# Patient Record
Sex: Female | Born: 1950
Health system: Southern US, Community
[De-identification: ages and names within clinical notes are randomized; demographics above are authoritative.]

## PROBLEM LIST (undated history)

## (undated) DIAGNOSIS — E785 Hyperlipidemia, unspecified: Secondary | ICD-10-CM

## (undated) DIAGNOSIS — Z90710 Acquired absence of both cervix and uterus: Secondary | ICD-10-CM

## (undated) DIAGNOSIS — A4902 Methicillin resistant Staphylococcus aureus infection, unspecified site: Secondary | ICD-10-CM

## (undated) DIAGNOSIS — G709 Myoneural disorder, unspecified: Secondary | ICD-10-CM

## (undated) DIAGNOSIS — J45909 Unspecified asthma, uncomplicated: Secondary | ICD-10-CM

## (undated) DIAGNOSIS — N6019 Diffuse cystic mastopathy of unspecified breast: Secondary | ICD-10-CM

## (undated) DIAGNOSIS — K219 Gastro-esophageal reflux disease without esophagitis: Secondary | ICD-10-CM

## (undated) DIAGNOSIS — E119 Type 2 diabetes mellitus without complications: Secondary | ICD-10-CM

## (undated) DIAGNOSIS — I10 Essential (primary) hypertension: Secondary | ICD-10-CM

## (undated) HISTORY — PX: BREAST SURGERY: SHX581

## (undated) HISTORY — DX: Diffuse cystic mastopathy of unspecified breast: N60.19

## (undated) HISTORY — PX: BREAST EXCISIONAL BIOPSY: SUR124

## (undated) HISTORY — PX: COLONOSCOPY: SHX174

## (undated) HISTORY — DX: Hyperlipidemia, unspecified: E78.5

## (undated) HISTORY — PX: ABDOMINAL HYSTERECTOMY: SHX81

---
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
## 1997-08-14 ENCOUNTER — Encounter (HOSPITAL_COMMUNITY): Admission: RE | Admit: 1997-08-14 | Discharge: 1997-11-12 | Payer: Self-pay | Admitting: Obstetrics and Gynecology

## 1997-09-16 ENCOUNTER — Ambulatory Visit (HOSPITAL_COMMUNITY): Admission: RE | Admit: 1997-09-16 | Discharge: 1997-09-16 | Payer: Self-pay | Admitting: Internal Medicine

## 1998-09-30 ENCOUNTER — Other Ambulatory Visit: Admission: RE | Admit: 1998-09-30 | Discharge: 1998-09-30 | Payer: Self-pay | Admitting: Internal Medicine

## 1999-01-04 ENCOUNTER — Emergency Department (HOSPITAL_COMMUNITY): Admission: EM | Admit: 1999-01-04 | Discharge: 1999-01-04 | Payer: Self-pay

## 1999-01-13 ENCOUNTER — Encounter: Payer: Self-pay | Admitting: Emergency Medicine

## 1999-01-13 ENCOUNTER — Inpatient Hospital Stay (HOSPITAL_COMMUNITY): Admission: EM | Admit: 1999-01-13 | Discharge: 1999-01-14 | Payer: Self-pay | Admitting: Emergency Medicine

## 1999-08-09 ENCOUNTER — Ambulatory Visit (HOSPITAL_COMMUNITY): Admission: RE | Admit: 1999-08-09 | Discharge: 1999-08-09 | Payer: Self-pay | Admitting: Internal Medicine

## 1999-08-09 ENCOUNTER — Encounter: Payer: Self-pay | Admitting: Internal Medicine

## 2000-01-09 ENCOUNTER — Encounter: Payer: Self-pay | Admitting: Emergency Medicine

## 2000-01-09 ENCOUNTER — Emergency Department (HOSPITAL_COMMUNITY): Admission: EM | Admit: 2000-01-09 | Discharge: 2000-01-09 | Payer: Self-pay | Admitting: Emergency Medicine

## 2000-02-08 ENCOUNTER — Emergency Department (HOSPITAL_COMMUNITY): Admission: EM | Admit: 2000-02-08 | Discharge: 2000-02-08 | Payer: Self-pay | Admitting: *Deleted

## 2000-03-15 ENCOUNTER — Ambulatory Visit (HOSPITAL_COMMUNITY): Admission: RE | Admit: 2000-03-15 | Discharge: 2000-03-15 | Payer: Self-pay | Admitting: Internal Medicine

## 2000-03-15 ENCOUNTER — Encounter: Payer: Self-pay | Admitting: Internal Medicine

## 2000-05-04 ENCOUNTER — Encounter: Payer: Self-pay | Admitting: Internal Medicine

## 2000-05-04 ENCOUNTER — Encounter: Admission: RE | Admit: 2000-05-04 | Discharge: 2000-05-04 | Payer: Self-pay | Admitting: Internal Medicine

## 2000-05-18 ENCOUNTER — Other Ambulatory Visit: Admission: RE | Admit: 2000-05-18 | Discharge: 2000-05-18 | Payer: Self-pay | Admitting: Obstetrics and Gynecology

## 2000-06-05 ENCOUNTER — Emergency Department (HOSPITAL_COMMUNITY): Admission: EM | Admit: 2000-06-05 | Discharge: 2000-06-06 | Payer: Self-pay | Admitting: Emergency Medicine

## 2000-10-11 ENCOUNTER — Encounter (INDEPENDENT_AMBULATORY_CARE_PROVIDER_SITE_OTHER): Payer: Self-pay

## 2000-10-11 ENCOUNTER — Ambulatory Visit (HOSPITAL_COMMUNITY): Admission: RE | Admit: 2000-10-11 | Discharge: 2000-10-11 | Payer: Self-pay | Admitting: Obstetrics and Gynecology

## 2001-05-03 ENCOUNTER — Encounter: Payer: Self-pay | Admitting: Internal Medicine

## 2001-05-03 ENCOUNTER — Ambulatory Visit (HOSPITAL_COMMUNITY): Admission: RE | Admit: 2001-05-03 | Discharge: 2001-05-03 | Payer: Self-pay | Admitting: Internal Medicine

## 2001-07-03 ENCOUNTER — Inpatient Hospital Stay (HOSPITAL_COMMUNITY): Admission: EM | Admit: 2001-07-03 | Discharge: 2001-07-04 | Payer: Self-pay

## 2001-07-20 ENCOUNTER — Other Ambulatory Visit: Admission: RE | Admit: 2001-07-20 | Discharge: 2001-07-20 | Payer: Self-pay | Admitting: Obstetrics and Gynecology

## 2001-08-09 ENCOUNTER — Encounter (INDEPENDENT_AMBULATORY_CARE_PROVIDER_SITE_OTHER): Payer: Self-pay

## 2001-08-09 ENCOUNTER — Inpatient Hospital Stay (HOSPITAL_COMMUNITY): Admission: RE | Admit: 2001-08-09 | Discharge: 2001-08-12 | Payer: Self-pay | Admitting: Obstetrics and Gynecology

## 2002-06-06 ENCOUNTER — Encounter: Payer: Self-pay | Admitting: Internal Medicine

## 2002-06-06 ENCOUNTER — Ambulatory Visit (HOSPITAL_COMMUNITY): Admission: RE | Admit: 2002-06-06 | Discharge: 2002-06-06 | Payer: Self-pay | Admitting: Internal Medicine

## 2002-08-13 ENCOUNTER — Encounter: Payer: Self-pay | Admitting: Obstetrics and Gynecology

## 2002-08-13 ENCOUNTER — Ambulatory Visit (HOSPITAL_COMMUNITY): Admission: RE | Admit: 2002-08-13 | Discharge: 2002-08-13 | Payer: Self-pay | Admitting: Obstetrics and Gynecology

## 2002-09-20 ENCOUNTER — Encounter: Payer: Self-pay | Admitting: Emergency Medicine

## 2002-09-20 ENCOUNTER — Emergency Department (HOSPITAL_COMMUNITY): Admission: EM | Admit: 2002-09-20 | Discharge: 2002-09-21 | Payer: Self-pay | Admitting: Emergency Medicine

## 2003-04-09 ENCOUNTER — Ambulatory Visit (HOSPITAL_COMMUNITY): Admission: RE | Admit: 2003-04-09 | Discharge: 2003-04-09 | Payer: Self-pay | Admitting: Gastroenterology

## 2003-05-08 ENCOUNTER — Encounter: Admission: RE | Admit: 2003-05-08 | Discharge: 2003-05-08 | Payer: Self-pay | Admitting: Gastroenterology

## 2003-06-09 ENCOUNTER — Ambulatory Visit (HOSPITAL_COMMUNITY): Admission: RE | Admit: 2003-06-09 | Discharge: 2003-06-09 | Payer: Self-pay | Admitting: Internal Medicine

## 2004-05-19 ENCOUNTER — Encounter: Admission: RE | Admit: 2004-05-19 | Discharge: 2004-05-19 | Payer: Self-pay | Admitting: Internal Medicine

## 2004-05-25 ENCOUNTER — Emergency Department (HOSPITAL_COMMUNITY): Admission: EM | Admit: 2004-05-25 | Discharge: 2004-05-25 | Payer: Self-pay | Admitting: Emergency Medicine

## 2004-06-09 ENCOUNTER — Ambulatory Visit (HOSPITAL_COMMUNITY): Admission: RE | Admit: 2004-06-09 | Discharge: 2004-06-09 | Payer: Self-pay | Admitting: Internal Medicine

## 2004-10-13 LAB — HM COLONOSCOPY

## 2005-05-11 ENCOUNTER — Encounter: Admission: RE | Admit: 2005-05-11 | Discharge: 2005-05-11 | Payer: Self-pay | Admitting: Internal Medicine

## 2005-07-17 ENCOUNTER — Emergency Department (HOSPITAL_COMMUNITY): Admission: EM | Admit: 2005-07-17 | Discharge: 2005-07-17 | Payer: Self-pay | Admitting: Family Medicine

## 2005-07-18 ENCOUNTER — Encounter: Admission: RE | Admit: 2005-07-18 | Discharge: 2005-07-18 | Payer: Self-pay | Admitting: Internal Medicine

## 2005-10-11 ENCOUNTER — Ambulatory Visit (HOSPITAL_COMMUNITY): Admission: RE | Admit: 2005-10-11 | Discharge: 2005-10-11 | Payer: Self-pay | Admitting: Internal Medicine

## 2005-10-28 ENCOUNTER — Ambulatory Visit (HOSPITAL_BASED_OUTPATIENT_CLINIC_OR_DEPARTMENT_OTHER): Admission: RE | Admit: 2005-10-28 | Discharge: 2005-10-28 | Payer: Self-pay | Admitting: Orthopedic Surgery

## 2006-02-01 ENCOUNTER — Encounter: Admission: RE | Admit: 2006-02-01 | Discharge: 2006-02-01 | Payer: Self-pay | Admitting: Internal Medicine

## 2006-04-18 DIAGNOSIS — A4902 Methicillin resistant Staphylococcus aureus infection, unspecified site: Secondary | ICD-10-CM

## 2006-04-18 HISTORY — DX: Methicillin resistant Staphylococcus aureus infection, unspecified site: A49.02

## 2006-07-31 ENCOUNTER — Encounter: Admission: RE | Admit: 2006-07-31 | Discharge: 2006-07-31 | Payer: Self-pay | Admitting: Internal Medicine

## 2006-10-17 ENCOUNTER — Ambulatory Visit (HOSPITAL_COMMUNITY): Admission: RE | Admit: 2006-10-17 | Discharge: 2006-10-17 | Payer: Self-pay | Admitting: Internal Medicine

## 2007-05-19 ENCOUNTER — Emergency Department (HOSPITAL_COMMUNITY): Admission: EM | Admit: 2007-05-19 | Discharge: 2007-05-19 | Payer: Self-pay | Admitting: Emergency Medicine

## 2007-05-31 ENCOUNTER — Ambulatory Visit: Payer: Self-pay

## 2007-11-21 ENCOUNTER — Encounter: Admission: RE | Admit: 2007-11-21 | Discharge: 2007-11-21 | Payer: Self-pay | Admitting: Internal Medicine

## 2008-02-04 ENCOUNTER — Ambulatory Visit: Payer: Self-pay | Admitting: Internal Medicine

## 2008-03-21 ENCOUNTER — Ambulatory Visit: Payer: Self-pay | Admitting: Internal Medicine

## 2008-04-24 ENCOUNTER — Ambulatory Visit: Payer: Self-pay | Admitting: Internal Medicine

## 2008-08-11 ENCOUNTER — Ambulatory Visit: Payer: Self-pay | Admitting: Internal Medicine

## 2008-08-13 ENCOUNTER — Encounter: Admission: RE | Admit: 2008-08-13 | Discharge: 2008-08-13 | Payer: Self-pay | Admitting: Internal Medicine

## 2008-08-25 ENCOUNTER — Ambulatory Visit: Payer: Self-pay | Admitting: Internal Medicine

## 2008-09-02 ENCOUNTER — Other Ambulatory Visit: Admission: RE | Admit: 2008-09-02 | Discharge: 2008-09-02 | Payer: Self-pay | Admitting: Internal Medicine

## 2008-09-02 ENCOUNTER — Ambulatory Visit: Payer: Self-pay | Admitting: Internal Medicine

## 2008-09-18 ENCOUNTER — Ambulatory Visit: Payer: Self-pay | Admitting: Internal Medicine

## 2009-02-23 ENCOUNTER — Ambulatory Visit: Payer: Self-pay | Admitting: Internal Medicine

## 2009-03-10 ENCOUNTER — Ambulatory Visit: Payer: Self-pay | Admitting: Internal Medicine

## 2009-03-24 ENCOUNTER — Ambulatory Visit: Payer: Self-pay | Admitting: Internal Medicine

## 2009-05-11 ENCOUNTER — Encounter: Admission: RE | Admit: 2009-05-11 | Discharge: 2009-05-11 | Payer: Self-pay | Admitting: Internal Medicine

## 2009-09-28 ENCOUNTER — Ambulatory Visit: Payer: Self-pay | Admitting: Internal Medicine

## 2009-10-12 ENCOUNTER — Ambulatory Visit: Payer: Self-pay | Admitting: Internal Medicine

## 2009-10-29 ENCOUNTER — Ambulatory Visit: Payer: Self-pay | Admitting: Internal Medicine

## 2009-11-09 ENCOUNTER — Ambulatory Visit: Payer: Self-pay | Admitting: Internal Medicine

## 2010-01-26 ENCOUNTER — Ambulatory Visit: Payer: Self-pay | Admitting: Internal Medicine

## 2010-03-12 ENCOUNTER — Ambulatory Visit: Payer: Self-pay | Admitting: Vascular Surgery

## 2010-03-12 ENCOUNTER — Encounter (INDEPENDENT_AMBULATORY_CARE_PROVIDER_SITE_OTHER): Payer: Self-pay | Admitting: Emergency Medicine

## 2010-03-12 ENCOUNTER — Emergency Department (HOSPITAL_COMMUNITY): Admission: EM | Admit: 2010-03-12 | Discharge: 2010-03-12 | Payer: Self-pay | Admitting: Emergency Medicine

## 2010-03-29 ENCOUNTER — Ambulatory Visit: Payer: Self-pay | Admitting: Internal Medicine

## 2010-06-01 ENCOUNTER — Encounter (HOSPITAL_COMMUNITY): Payer: Self-pay

## 2010-06-01 ENCOUNTER — Emergency Department (HOSPITAL_COMMUNITY): Payer: BC Managed Care – PPO

## 2010-06-01 ENCOUNTER — Emergency Department (HOSPITAL_COMMUNITY)
Admission: EM | Admit: 2010-06-01 | Discharge: 2010-06-01 | Disposition: A | Discharge status: Home / Self Care | Payer: BC Managed Care – PPO | Attending: Emergency Medicine | Admitting: Emergency Medicine

## 2010-06-01 DIAGNOSIS — R112 Nausea with vomiting, unspecified: Secondary | ICD-10-CM | POA: Insufficient documentation

## 2010-06-01 DIAGNOSIS — R1012 Left upper quadrant pain: Secondary | ICD-10-CM | POA: Insufficient documentation

## 2010-06-01 DIAGNOSIS — R1013 Epigastric pain: Secondary | ICD-10-CM | POA: Insufficient documentation

## 2010-06-01 DIAGNOSIS — K219 Gastro-esophageal reflux disease without esophagitis: Secondary | ICD-10-CM | POA: Insufficient documentation

## 2010-06-01 DIAGNOSIS — I10 Essential (primary) hypertension: Secondary | ICD-10-CM | POA: Insufficient documentation

## 2010-06-01 DIAGNOSIS — E785 Hyperlipidemia, unspecified: Secondary | ICD-10-CM | POA: Insufficient documentation

## 2010-06-01 HISTORY — DX: Gastro-esophageal reflux disease without esophagitis: K21.9

## 2010-06-01 HISTORY — DX: Essential (primary) hypertension: I10

## 2010-06-01 HISTORY — DX: Acquired absence of both cervix and uterus: Z90.710

## 2010-06-01 LAB — URINE MICROSCOPIC-ADD ON

## 2010-06-01 LAB — COMPREHENSIVE METABOLIC PANEL
ALT: 21 U/L (ref 0–35)
AST: 21 U/L (ref 0–37)
Albumin: 4.3 g/dL (ref 3.5–5.2)
Alkaline Phosphatase: 53 U/L (ref 39–117)
BUN: 14 mg/dL (ref 6–23)
CO2: 30 mEq/L (ref 19–32)
Calcium: 9.3 mg/dL (ref 8.4–10.5)
Chloride: 101 mEq/L (ref 96–112)
Creatinine, Ser: 0.9 mg/dL (ref 0.4–1.2)
GFR calc Af Amer: >60 mL/min (ref >60)
GFR calc non Af Amer: >60 mL/min (ref >60)
Glucose, Bld: 140 mg/dL — ABNORMAL HIGH (ref 70–99)
Potassium: 3.5 mEq/L (ref 3.5–5.1)
Sodium: 141 mEq/L (ref 135–145)
Total Bilirubin: 0.7 mg/dL (ref 0.3–1.2)
Total Protein: 7.3 g/dL (ref 6.0–8.3)

## 2010-06-01 LAB — DIFFERENTIAL
Basophils Absolute: 0 10*3/uL (ref 0.0–0.1)
Basophils Relative: 0 % (ref 0–1)
Eosinophils Absolute: 0 10*3/uL (ref 0.0–0.7)
Eosinophils Relative: 0 % (ref 0–5)
Lymphocytes Relative: 11 % — ABNORMAL LOW (ref 12–46)
Lymphs Abs: 0.7 10*3/uL (ref 0.7–4.0)
Monocytes Absolute: 0.3 10*3/uL (ref 0.1–1.0)
Monocytes Relative: 5 % (ref 3–12)
Neutro Abs: 5.6 10*3/uL (ref 1.7–7.7)
Neutrophils Relative %: 85 % — ABNORMAL HIGH (ref 43–77)

## 2010-06-01 LAB — URINALYSIS, ROUTINE W REFLEX MICROSCOPIC
Bilirubin Urine: NEGATIVE
Ketones, ur: NEGATIVE mg/dL
Leukocytes, UA: NEGATIVE
Nitrite: NEGATIVE
Protein, ur: NEGATIVE mg/dL
Specific Gravity, Urine: 1.02 (ref 1.005–1.030)
Urine Glucose, Fasting: NEGATIVE mg/dL
Urobilinogen, UA: 0.2 mg/dL (ref 0.0–1.0)
pH: 7.5 (ref 5.0–8.0)

## 2010-06-01 LAB — LIPASE, BLOOD: Lipase: 16 U/L (ref 11–59)

## 2010-06-01 LAB — CBC
HCT: 37.5 % (ref 36.0–46.0)
Hemoglobin: 12.9 g/dL (ref 12.0–15.0)
MCH: 30.3 pg (ref 26.0–34.0)
MCHC: 34.4 g/dL (ref 30.0–36.0)
MCV: 88 fL (ref 78.0–100.0)
Platelets: 229 10*3/uL (ref 150–400)
RBC: 4.26 MIL/uL (ref 3.87–5.11)
RDW: 13.1 % (ref 11.5–15.5)
WBC: 6.7 10*3/uL (ref 4.0–10.5)

## 2010-06-01 MED ORDER — IOHEXOL 300 MG/ML  SOLN: 100.0000 mL | Freq: Once | INTRAMUSCULAR | Status: DC | PRN

## 2010-06-01 MED ORDER — IOHEXOL 300 MG/ML  SOLN
100.0000 mL | Freq: Once | INTRAMUSCULAR | Status: AC | PRN
  Administered 2010-06-01: 100 mL via INTRAVENOUS

## 2010-06-02 DIAGNOSIS — K529 Noninfective gastroenteritis and colitis, unspecified: Secondary | ICD-10-CM

## 2010-06-02 LAB — URINE CULTURE
Colony Count: 75000
Culture  Setup Time: 201202141704

## 2010-06-29 LAB — POCT I-STAT, CHEM 8
BUN: 16 mg/dL (ref 6–23)
Calcium, Ion: 1.17 mmol/L (ref 1.12–1.32)
Chloride: 102 mEq/L (ref 96–112)
Creatinine, Ser: 1 mg/dL (ref 0.4–1.2)
Glucose, Bld: 133 mg/dL — ABNORMAL HIGH (ref 70–99)
HCT: 38 % (ref 36.0–46.0)
Hemoglobin: 12.9 g/dL (ref 12.0–15.0)
Potassium: 3.7 mEq/L (ref 3.5–5.1)
Sodium: 141 mEq/L (ref 135–145)
TCO2: 29 mmol/L (ref 0–100)

## 2010-09-03 NOTE — Op Note (Signed)
[facility]  Patient:    Carla Oconnor, Carla Oconnor                      MRN: [id number] Proc. Date: 10/11/00 Attending:  Miguel Aschoff, M.D.                           Operative Report  PREOPERATIVE DIAGNOSES:       1. Menometrorrhagia.                               2. Uterine fibroids.  POSTOPERATIVE DIAGNOSES:      1. Menometrorrhagia.                               2. Uterine fibroids.  PROCEDURES:                   1. Cervical dilatation.                               2. Hysteroscopy.                               3. Uterine curettage.  SURGEON:                      Miguel Aschoff, M.D.  ANESTHESIA:                   General.  COMPLICATIONS:                None.  JUSTIFICATION:                The patient is a 60 year old black female with a history of heavy, irregular vaginal bleeding unresponsive to cyclic progestational therapy.  She has a known history of uterine fibroids.  She presents now to undergo hysteroscopy D&C to see if any etiology for her continued heavy bleeding and irregular bleeding can be established and corrected.  Informed consent has been obtained.  DESCRIPTION OF PROCEDURE:     The patient was taken to the operating room, placed in the supine position and general anesthesia was administered without difficulty.  She was then placed in the dorsal lithotomy position, prepped and draped in the usual sterile fashion.  The bladder was catheterized. Examination was carried out, which revealed the uterus to be anterior, approximately 10 weeks in size and irregular in shape with palpable uterine fibroids.  The cervix was without gross lesions.  A speculum was placed in the vaginal vault.  The anterior cervical lip was grasped with a tenaculum and the endocervical canal was dilated using serial Pratt dilators.  After this was done, she was noted to have clear, serous-type fluid draining from the uterus on dilatation of the cervix.  After this  fluid was drained, the hysteroscope was advanced under direct visualization.  The endocervical canal was unremarkable.  The uterine cavity was inspected. The endometrium appeared to be very atrophic.  There were two posterior submucous uterine myomas noted. There also appeared to be scarring within the uterine cavity with belt-like lips of adhesions noted at the apex of the fundus.  The endometrium appeared to be atrophic.  The hysteroscope was removed.  A medium-sized  sharp curet was placed in the uterine cavity and the cavity was vigorously curetted.  A serrated curet was placed in the uterine cavity and vigorous curettage continued.  Only a small amount of tissue was obtained, consistent with the findings of the atrophic endometrium.  After this was done, final inspection was made with the hysteroscope, and no other abnormalities were noted.  At this point, it was elected to complete the procedure.  The instruments were removed.  Hemostasis was readily achieved and the patient was taken to the recovery room in satisfactory condition.  The uterine curettings were sent for histologic study.  The patient tolerated the procedure well.  Estimated blood loss was approximately 60 cc.  Plans are for the patient to be discharged to home.  Medications for home will include Darvocet-N 100, one q.4h. p.r.n. pain; doxycycline, one b.i.d. x 2 days.  The patient is to call on June 28 for her pathology report.  She is to call for any severe problems such as severe pain or heavy bleeding.  She will be seen back in four weeks for follow-up examination.  An FSH and estradiol will be obtained to see if these values are now consistent with menopause.  Prior values obtained in November were not. DD:  10/11/00 TD:  10/11/00 Job: [id number]

## 2010-09-03 NOTE — Op Note (Signed)
NAMEMARAYAH, Carla Oconnor             ACCOUNT NO.:  [id number]   MEDICAL RECORD NO.:  [id number]          PATIENT TYPE:  AMB   LOCATION:  DSC                          FACILITY:  [facility]   PHYSICIAN:  Katy Fitch. Sypher, M.D. DATE OF BIRTH:  06-Apr-1951   DATE OF PROCEDURE:  10/28/2005  DATE OF DISCHARGE:                                 OPERATIVE REPORT   PREOPERATIVE DIAGNOSIS:  Chronic stenosing tenosynovitis right long finger  at A1 pulley.   POSTOP DIAGNOSIS:  Chronic stenosing tenosynovitis right long finger at A1  pulley.   OPERATIONS:  Release of right long finger A1 pulley.   OPERATING SURGEON:  Katy Fitch. Sypher, M.D.   ASSISTANT:  Marveen Reeks. Dasnoit, P.A.-C.   ANESTHESIA:  2% lidocaine metacarpal head level block, right long finger  supplemented by IV sedation.   SUPERVISING ANESTHESIOLOGIST:  Bedelia Person, M.D.   INDICATIONS:  Clora Ohmer is a 60 year old woman referred by Dr.  Donette Larry for evaluation and management of right long finger triggering.  Clinical examination revealed signs of stenosing tenosynovitis at the A1  pulley.  Due to a failure to respond to nonoperative measures Ms. Hendricks  is brought to the operating room, at this time, for release of her right  long finger A1 pulley.   PROCEDURE:  Letrice Pollok is brought to the operating room and placed in  the supine position on the operating table.  Following anesthesia  consultation by Dr. Gelene Mink and medical clearance;  release of the A1  pulley under monitored anesthesia care was recommended.   The right arm was prepped with Betadine soap and solution, and sterilely  draped.  Then 2% lidocaine was infiltrated into the path of the intended  incision and into the flexor sheath of the right long finger.  When  anesthesia was satisfactory the arm was exsanguinated with Esmarch bandage  and an arterial tourniquet inflated to 220 mmHg.  The procedure commenced  with a short oblique incision paralleling the  distal palmar crease.  The  subcutaneous tissues were carefully divided along the palmar fascia.  The  fibers of the fascia were split revealing the A1 pulley.  The pulley was  released along its radial border extending to the A2 pulley junction  distally.  There was no A-0 pulley noted proximally.   Thereafter, full active range of motion of the finger was recovered.  The  wound was repaired with mattress suture of 5-0 nylon.  There were no  apparent complications.  Ms. [name] tolerated the surgery and anesthesia  well.  She was transferred to the recovery room with stable vital signs.      Katy Fitch Sypher, M.D.  Electronically Signed     RVS/MEDQ  D:  10/28/2005  T:  10/28/2005  Job:  [id number]

## 2010-09-03 NOTE — Discharge Summary (Signed)
[facility]  Patient:    Carla Oconnor, Carla Oconnor Visit Number: [id number] MRN: [id number]          Service Type: GYN Location: 9300 [id number] Attending Physician:  Miguel Aschoff Dictated by:   Miguel Aschoff, M.D. Admit Date:  08/09/2001 Discharge Date: 08/12/2001                             Discharge Summary  ADMISSION DIAGNOSES:          1. Uterine fibroids.                               2. Menorrhagia.                               3. Pelvic pain.  HISTORY OF PRESENT ILLNESS:   The patient is a 60 year old black female female, gravida 3, para 2-0-0-3.  She has had a history of persistent heavy vaginal bleeding and pelvic pain.  The patient did undergo a D&C and hysteroscopy in June 2003 to assess this problem.  In addition, she had been previously treated with Lupron therapy in an effort to control her pain and bleeding.  Because of the progression of her symptomatology, she requested that a definitive procedure be carried out and is admitted at this time to undergo total abdominal hysterectomy.  HOSPITAL COURSE:              Preoperative studies were obtained.  This revealed an admission hemoglobin of 11.0, hematocrit 33.6, white count was 3700.  PT and PTT were within normal limits.  Chemistry profile shows slight elevation of glucose at 121.  Urinalysis was negative.  On August 09, 2001, under general anesthesia, a total abdominal hysterectomy and right salpingo-oophorectomy were carried out.  The patient did request preservation of one ovary.  Findings at the time of surgery were a globular uterus, approximately 12 weeks in size with fibroids, with no other remarkable findings noted at the time of surgery.  The patients postoperative course was essentially uncomplicated.  Her hemoglobin did drop to 9.9.  She remained stable and tolerated increasing ambulation and diet well.  She was in satisfactory condition and on August 12, 2001, was able to be  discharged home.  MEDICATIONS:                  Vicodin one every three hours as needed for pain.  DISCHARGE INSTRUCTIONS:       She was instructed to do no heavy lifting, to call if there were any problems such as fever, pain, or heavy bleeding.  PATHOLOGY:                    The pathology report on the hysterectomy specimen revealed a normal cervix, benign inactive endometrium, leiomyomata uteri, submucosal, intramural, and subserosal.  There were benign paratubal cysts associated with the right tube and ovary.  The weight of the uterus was 436 g.  FOLLOW-UP:                    The patient will be seen back in four weeks for follow-up examination.  DISPOSITION:                  She was discharged home in satisfactory condition on a regular diet.  Dictated by:   Miguel Aschoff, M.D. Attending Physician:  Miguel Aschoff DD:  09/15/01 TD:  09/17/01 Job: [id number]

## 2010-09-03 NOTE — Op Note (Signed)
NAME:  Carla Oconnor, Carla Oconnor NO.:  [id number]   MEDICAL RECORD NO.:  [id number]                   PATIENT TYPE:  AMB   LOCATION:  ENDO                                 FACILITY:  [facility]   PHYSICIAN:  Graylin Shiver, M.D.                DATE OF BIRTH:  04-12-1951   DATE OF PROCEDURE:  04/09/2003  DATE OF DISCHARGE:                                 OPERATIVE REPORT   PROCEDURE PERFORMED:  Colonoscopy.   INDICATIONS FOR PROCEDURE:  Rectal bleeding.   Informed consent was obtained after explanation of the risks of bleeding,  infection, and perforation.   PREMEDICATIONS:  Fentanyl 60 mcg  IV, Versed 6 mg IV.   DESCRIPTION OF PROCEDURE:  With the patient in the left lateral decubitus  position, a rectal exam was performed and no masses were felt.  No external  lesions were seen.  The Olympus colonoscope was inserted into the rectum and  advanced around the colon to the cecum.  Cecal landmarks were identified.  The cecum and ascending colon looked normal.  The transverse colon looked  normal.  The descending colon, sigmoid and rectum looked normal.  The scope  was retroflexed in the rectum and no obvious enlarged hemorrhoids were seen  or other abnormalities.  The scope was then straightened and brought out.  There was some redness in the anal canal.  The patient tolerated the  procedure well without complications.   IMPRESSION:  Normal-appearing colonic mucosa.  There was some redness in the  anal canal which I suspect is where the patient notices her bleeding coming  from.   RECOMMENDATIONS:  I would recommend that she use Metamucil once a day or an  equivalent fiber supplement so as not to strain with bowel movements and to  use Anusol HC suppositories if the bleeding continues.                                               Graylin Shiver, M.D.    Germain Osgood  D:  04/09/2003  T:  04/10/2003  Job:  [id number]   cc:   Lilla Shook, M.D.  [address], [address]  [place]  [place] [address]  Fax: [phone]

## 2010-09-03 NOTE — Op Note (Signed)
[facility]  Patient:    Carla Oconnor, Carla Oconnor Visit Number: [id number] MRN: [id number]          Service Type: GYN Location: 9300 [id number] Attending Physician:  Miguel Aschoff Dictated by:   Miguel Aschoff, M.D. Proc. Date: 08/09/01 Admit Date:  08/09/2001                             Operative Report  PREOPERATIVE DIAGNOSES:       Uterine fibroids, pelvic pain, menorrhagia.  POSTOPERATIVE DIAGNOSES:      Uterine fibroids, pelvic pain, menorrhagia.  PROCEDURE:                    Total abdominal hysterectomy, right salpingo-oophorectomy.  SURGEON:                      Miguel Aschoff, M.D.  ASSISTANT:                    Brook A. Edward Jolly, M.D.  ANESTHESIA:                   General.  COMPLICATIONS:                None.  JUSTIFICATION:                The patient is a 60 year old black female with history of persistent pelvic pain and uterine fibroids.  Because of the persistence of the pain and pressure secondary to the fibroids and her desire for definitive treatment, she presents now to undergo a total abdominal hysterectomy.  Patient has requested that at least one ovary be conserved at the time of surgery if there is no pathology indicating that the ovaries need to be removed.  Informed consent has been obtained.  Risks and benefits were discussed with the patient.  PROCEDURE:                    Patient was taken to the operating room, placed in a supine position.  General anesthesia was administered without difficulty. She was then placed in the dorsal lithotomy position.  Prepped and draped in the usual sterile fashion.  Foley catheter was inserted.  Patient had a previous midline incision.  This incision was reincised.  Previous surgical scar was excised.  The incision was extended down through the subcutaneous tissue with bleeding points being clamped and coagulated as they were encountered.  The fascia was then identified and incised  vertically. Peritoneum was revealed below.  The peritoneum was then entered carefully avoiding underlying structures.  The peritoneal incision was extended under direct visualization.  The uterus was noted to be globular, approximately 12 weeks in size.  The fundus was involved with a large myoma.  Smaller subserosal myomas were also noted.  The ovaries appeared to be within normal limits.  The cul-de-sac was unremarkable.  Bladder peritoneum was somewhat scarred and advanced anteriorly onto the anterior portion of the uterus. Intestinal surfaces appeared to be unremarkable.  At this point with self retaining retractor placed through the wound the uterus was elevated by clamping across the utero-ovarian ligaments and round ligaments.  The round ligaments were then identified, clamped, cut, and suture ligated using suture ligatures of 0 Vicryl.  Bladder flap was then created anteriorly.  The adhesions holding the bladder to the anterior uterine wall were then taken down sharply with extreme care  to avoid any injury to the bladder.  This was done successfully.  Then, a perforation was made below the utero-ovarian ligament and on the right side the infundibulopelvic ligament was identified, clamped with a Heaney clamp, cut, and doubly ligated using two ligatures of 0 Vicryl.  On the left side perforation was made below the utero-ovarian ligament and then the utero-ovarian ligament and tube were clamped with a Heaney clamp, pedicle cut, and doubly ligated with two ligatures of 0 Vicryl. This conserved the left ovary.  The broad ligament was then skeletonized.  The uterine vessels were identified, clamped with curved Heaney clamps.  These pedicles were then cut and suture ligated using suture ligatures of 0 Vicryl. Then using straight Heaney clamps the paracervical fascia was clamped, cut, and suture ligated using suture ligatures of 0 Vicryl.  Cardinal ligaments were then clamped using straight  Heaney clamps with all pedicles being ligated with 0 Vicryl.  The uterosacral ligaments were then identified, clamped separately, pedicles cut and suture ligated using suture ligatures of 0 Vicryl.  It was then possible to identify the reflection of the vagina to the cervix.  Using curved Heaney clamps the apex of the vaginal cuff was clamped and cut.  The specimen was then excised consisting of the cervix.  The fundus had been previously removed after clamping the uterine vessels to allow for better visualization.  The angles of the vaginal cuff were then ligated using figure-of-eight sutures of 0 Vicryl.  The uterosacral ligaments were incorporated for support.  Then the cuff was closed using running interlocking 0 Vicryl suture.  The pelvic floor was reperitonealized using running continuous 2-0 Vicryl suture.  At this point with lap counts being taken and found to be correct with excellent hemostasis, it was elected to close the abdomen.  The parietoperitoneum was closed using a running continuous 0 Vicryl suture.  The fascia was closed using two sutures at 0 Vicryl each starting at the angles of the fascial incision superiorly and inferiorly and then meeting in the midline.  Subcutaneous tissue was closed using interrupted 0 Vicryl sutures and then the skin was closed using staples.  The estimated blood loss was approximately 100 cc.  Patient tolerated the procedure well and went to the recovery room in satisfactory condition. Dictated by:   Miguel Aschoff, M.D. Attending Physician:  Miguel Aschoff DD:  08/09/01 TD:  08/09/01 Job: [id number]

## 2010-09-03 NOTE — H&P (Signed)
[facility]  Patient:    Carla Oconnor, Carla Oconnor I Visit Number: [id number] MRN: [id number]          Service Type: GYN Location: 9300 [id number] Attending Physician:  Miguel Aschoff Dictated by:   Miguel Aschoff, M.D. Admit Date:  08/09/2001                           History and Physical  ADMISSION DIAGNOSIS:          Uterine fibroids, menorrhagia, pelvic pain.  BRIEF HISTORY:                The patient is a 60 year old black female gravida 2 para 2-0-0-3 who has been complaining of heavy vaginal bleeding and was noted to have uterine fibroids.  The patient previously had been treated in June 2002 with a D&C and hysteroscopy.  She did not, at that time, have any submucous myomas.  The procedure did decrease the bleeding somewhat; however, the pelvic pain and pressure have continued.  Further options were discussed with the patient concerning treatment of the fibroids.  These included the use of Lupron.  However, the patient reported that she could not tolerate the hot flashes associated with previous Lupron use and did not want to try this medication.  She therefore requested that an extended procedure be carried out and was admitted to the hospital to undergo total abdominal hysterectomy.  The patients last Pap smear of May 18, 2000 was satisfactory and within normal limits.  Patient, in addition, has had ultrasound examinations carried out to assess her lower abdominal pain.  Again, these proved to reveal the presence of these uterine myomas.  No other pathology was noted in the pelvis. The patient is therefore admitted at this time to undergo total abdominal hysterectomy.  She has requested that a procedure be performed.  However, she requests that at least one ovary be preserved if possible.  The patient was previously evaluated for occult blood in the stool.  Air contrast barium enema was carried out in early April 2003 and was normal.  Patient denies  any dysuria, frequency, or urgency.  PAST MEDICAL HISTORY:         The patient denies any history of high blood pressure, diabetes, or heart disease.  She does suffer from gastrointestinal reflux and takes Nexium.  ALLERGIC HISTORY:             Positive for allergies to ADVIL, ALEVE, IBUPROFEN.  CURRENT MEDICATIONS:          1. Nexium 40 mg daily.                               2. Bextra 10 mg as needed for pain.  PAST SURGICAL HISTORY:        Positive for previous C section x2, right breast lumpectomy, and tubal sterilization.  SOCIAL HISTORY:               Patient is married, has three children.  She denies the use of alcohol, drugs, or tobacco.  REVIEW OF SYSTEMS:            HEENT:  Negative.  CARDIORESPIRATORY:  Negative for shortness of breath, cough, or hemoptysis.  GI:  Positive for symptoms of reflux.  She denies any melena or hematochezia.  There is no nausea, vomiting, or diarrhea noted.  GU:  Negative.  GYN:  See present illness.  NEUROMUSCULAR: Negative.  PHYSICAL EXAMINATION:  GENERAL:                      The patient is a well-developed, well-nourished black female in no acute distress.  VITAL SIGNS:                  Temperature 97.7, pulse 94, respirations 16, blood pressure 144/84.  HEENT:                        Normocephalic, atraumatic.  Eyes show pupils to be equal and reactive to light and accommodation.  Sclerae white, conjunctivae pink.  Extraocular muscles have full range of motion.  Nose patent without gross lesion.  Tongue is moist and midline.  Throat is clear.  NECK:                         Supple.  There is no adenopathy or jugular venous distention noted and no thyromegaly is noted.  HEART:                        Reveals a regular rhythm with no murmur or gallop.  BREASTS:                      Nontender and no masses are found.  ABDOMEN:                      Soft.  There is no evidence of enlargement of the liver, kidneys, or spleen.  There  is a midline well-healed incision from the umbilicus to the symphysis pubis.  No masses are noted.  Bowel sounds are active.  There is no rebound or guarding.  BACK:                         Reveals no CVA tenderness or deformity.  PELVIC:                       Reveals normal external genitalia, normal Bartholins and skenes glands, normal urethra.  Vaginal vault is without gross lesion, cervix is without gross lesion.  Uterus is globular, approximately 10-12 week equivalent size, somewhat irregular in shape.  The adnexa reveal no definite masses.  RECTAL:                       Rectovaginal exam is confirmatory.  Prior hemoccult exam was positive.  EXTREMITIES:                  Reveal no cyanosis, clubbing, or edema.  SKIN:                         Without gross lesion.  NEUROLOGIC:                   Grossly intact.  IMPRESSION:                   Pelvic pain, menorrhagia, uterine fibroids.  PLAN:                         The patient to undergo total abdominal hysterectomy. Dictated by:   Miguel Aschoff, M.D. Attending Physician:  Miguel Aschoff DD:  08/09/01 TD:  08/09/01 Job: [id number]

## 2010-09-03 NOTE — Cardiovascular Report (Signed)
Carla Oconnor. [facility]  Patient:    Carla Oconnor, FUJITA Visit Number: [id number] MRN: [id number]          Service Type: MED Location: 2000 [id number] Attending Physician:  Silvestre Mesi Dictated by:   Aram Candela. Aleen Campi, M.D. Proc. Date: 07/04/01 Admit Date:  07/03/2001 Discharge Date: 07/04/2001   CC:         Cala Bradford R. Renae Gloss, M.D.  Cardiac Catheterization Laboratory   Cardiac Catheterization  REFERRING PHYSICIAN: Merlene Laughter. Renae Gloss, M.D.  PROCEDURES: 1. Left heart catheterization. 2. Coronary cineangiography. 3. Left ventricular cineangiography. 4. Perclose of the right femoral artery.  INDICATIONS FOR PROCEDURE: This 60 year old female was admitted from the emergency room at [facility] after the onset of severe anterior chest pain, which was intermittent for two days prior to admission. She has a strong family history of coronary artery disease with an older sister having a large acute myocardial infarction at the age of 48. Her electrocardiogram showed nonspecific T wave changes and her rhythm was stable with the strong family history coronary artery disease and new onset of chest pain requiring admission from the emergency room, it was felt that she needed cardiac catheterization to determine her coronary status. Her chest pain was relieved with nitroglycerin in the emergency room.  DESCRIPTION OF PROCEDURE: After signing an informed consent, the patient was premedicated with 50 mg of Benadryl intravenously and brought to the cardiac catheterization lab. Her right groin was prepped and draped in a sterile fashion and anesthetized locally with 1% lidocaine.  A 6 French introducer sheath was inserted percutaneously into the right femoral artery.  The 6 [place] #4 Judkins coronary catheters were used to make injections into the native coronary arteries. A 6 French pigtail catheter was used to measure pressures in the left ventricle  and aorta and to make a mid stream injection into the left ventricle. The patient tolerated the procedure well and no complications were noted. At the end of the procedure, the catheter and sheath were removed from the right femoral artery and hemostasis was easily obtained with a Perclose closure system.  MEDICATIONS GIVEN: None.  HEMODYNAMIC DATA: Left ventricular pressure 139/0-19, aortic pressure 138/82 without a mean of 103. Left ventricular ejection fraction was measured at 71%.   CINE FINDINGS:  CORONARY CINEANGIOGRAPHY: Left coronary artery: The ostium and left main appear normal.  Left anterior descending appears normal.  Circumflex coronary artery appears normal.  Right coronary artery appears normal.  LEFT VENTRICULAR CINEANGIOGRAM: The left ventricular chamber size, contractility and wall thickness appear normal. There is no evidence for segmental abnormalities. The mitral and aortic valves appear normal.  INTERPRETATION: 1. Normal coronary arteries. 2. Normal left ventricular function with ejection fraction 71%. 3. Normal aortic and mitral valves. 4. Successful Perclose of the right femoral artery.  DISPOSITION: After successful Perclose, will be able to ambulate in one hour and discharged home from the short-stay unit in one to two hours. Dictated by:   Aram Candela. Aleen Campi, M.D. Attending Physician:  Silvestre Mesi DD:  07/04/01 TD:  07/05/01 Job: [id number]

## 2010-09-28 ENCOUNTER — Encounter: Payer: BC Managed Care – PPO | Admitting: Internal Medicine

## 2010-10-18 NOTE — Progress Notes (Signed)
No show for this appointment

## 2010-12-28 ENCOUNTER — Other Ambulatory Visit: Payer: BC Managed Care – PPO | Admitting: Internal Medicine

## 2010-12-28 DIAGNOSIS — Z0000 Encounter for general adult medical examination without abnormal findings: Secondary | ICD-10-CM

## 2010-12-28 LAB — HEPATIC FUNCTION PANEL
ALT: 19 U/L (ref 0–35)
AST: 20 U/L (ref 0–37)
Albumin: 4.5 g/dL (ref 3.5–5.2)
Alkaline Phosphatase: 59 U/L (ref 39–117)
Bilirubin, Direct: 0.1 mg/dL (ref 0.0–0.3)
Indirect Bilirubin: 0.5 mg/dL (ref 0.0–0.9)
Total Bilirubin: 0.6 mg/dL (ref 0.3–1.2)
Total Protein: 7.1 g/dL (ref 6.0–8.3)

## 2010-12-28 LAB — LIPID PANEL
Cholesterol: 209 mg/dL — ABNORMAL HIGH (ref 0–200)
HDL: 53 mg/dL (ref >39)
LDL Cholesterol: 132 mg/dL — ABNORMAL HIGH (ref 0–99)
Total CHOL/HDL Ratio: 3.9 Ratio
Triglycerides: 119 mg/dL (ref <150)
VLDL: 24 mg/dL (ref 0–40)

## 2010-12-28 LAB — CBC WITH DIFFERENTIAL/PLATELET
Basophils Absolute: 0 10*3/uL (ref 0.0–0.1)
Basophils Relative: 1 % (ref 0–1)
Eosinophils Absolute: 0.1 10*3/uL (ref 0.0–0.7)
Eosinophils Relative: 2 % (ref 0–5)
HCT: 37.3 % (ref 36.0–46.0)
Hemoglobin: 12.2 g/dL (ref 12.0–15.0)
Lymphocytes Relative: 43 % (ref 12–46)
Lymphs Abs: 2.1 10*3/uL (ref 0.7–4.0)
MCH: 29.7 pg (ref 26.0–34.0)
MCHC: 32.7 g/dL (ref 30.0–36.0)
MCV: 90.8 fL (ref 78.0–100.0)
Monocytes Absolute: 0.5 10*3/uL (ref 0.1–1.0)
Monocytes Relative: 10 % (ref 3–12)
Neutro Abs: 2.1 10*3/uL (ref 1.7–7.7)
Neutrophils Relative %: 44 % (ref 43–77)
Platelets: 245 10*3/uL (ref 150–400)
RBC: 4.11 MIL/uL (ref 3.87–5.11)
RDW: 13.5 % (ref 11.5–15.5)
WBC: 4.8 10*3/uL (ref 4.0–10.5)

## 2010-12-28 LAB — BASIC METABOLIC PANEL
BUN: 17 mg/dL (ref 6–23)
CO2: 30 mEq/L (ref 19–32)
Calcium: 9.5 mg/dL (ref 8.4–10.5)
Chloride: 102 mEq/L (ref 96–112)
Creat: 0.93 mg/dL (ref 0.50–1.10)
Glucose, Bld: 154 mg/dL — ABNORMAL HIGH (ref 70–99)
Potassium: 4 mEq/L (ref 3.5–5.3)
Sodium: 141 mEq/L (ref 135–145)

## 2010-12-29 LAB — VITAMIN D 25 HYDROXY (VIT D DEFICIENCY, FRACTURES): Vit D, 25-Hydroxy: 38 ng/mL (ref 30–89)

## 2010-12-29 LAB — HEMOGLOBIN A1C
Hgb A1c MFr Bld: 6.9 % — ABNORMAL HIGH (ref <5.7)
Mean Plasma Glucose: 151 mg/dL — ABNORMAL HIGH (ref <117)

## 2010-12-30 ENCOUNTER — Ambulatory Visit (INDEPENDENT_AMBULATORY_CARE_PROVIDER_SITE_OTHER): Payer: BC Managed Care – PPO | Admitting: Internal Medicine

## 2010-12-30 ENCOUNTER — Encounter: Payer: Self-pay | Admitting: Internal Medicine

## 2010-12-30 VITALS — BP 128/70 | HR 82 | Temp 98.4°F | Ht 62.0 in | Wt 188.0 lb

## 2010-12-30 DIAGNOSIS — K219 Gastro-esophageal reflux disease without esophagitis: Secondary | ICD-10-CM

## 2010-12-30 DIAGNOSIS — J45909 Unspecified asthma, uncomplicated: Secondary | ICD-10-CM

## 2010-12-30 DIAGNOSIS — E119 Type 2 diabetes mellitus without complications: Secondary | ICD-10-CM

## 2010-12-30 DIAGNOSIS — E785 Hyperlipidemia, unspecified: Secondary | ICD-10-CM

## 2010-12-30 DIAGNOSIS — N6019 Diffuse cystic mastopathy of unspecified breast: Secondary | ICD-10-CM

## 2010-12-30 DIAGNOSIS — I10 Essential (primary) hypertension: Secondary | ICD-10-CM

## 2010-12-30 DIAGNOSIS — Z0000 Encounter for general adult medical examination without abnormal findings: Secondary | ICD-10-CM

## 2010-12-30 LAB — POCT URINALYSIS DIPSTICK
Bilirubin, UA: NEGATIVE
Blood, UA: NEGATIVE
Glucose, UA: NEGATIVE
Ketones, UA: NEGATIVE
Leukocytes, UA: NEGATIVE
Nitrite, UA: NEGATIVE
Protein, UA: NEGATIVE
Spec Grav, UA: 1.005
Urobilinogen, UA: NEGATIVE
pH, UA: 5

## 2011-01-06 LAB — POCT CARDIAC MARKERS
CKMB, poc: <1 — ABNORMAL LOW
CKMB, poc: <1 — ABNORMAL LOW
Myoglobin, poc: 47.7
Myoglobin, poc: 72
Operator id: 294521
Operator id: 294521
Troponin i, poc: <0.05
Troponin i, poc: <0.05

## 2011-01-06 LAB — POCT I-STAT CREATININE
Creatinine, Ser: 1
Operator id: 294521

## 2011-01-06 LAB — I-STAT 8, (EC8 V) (CONVERTED LAB)
Acid-Base Excess: 3 — ABNORMAL HIGH
BUN: 11
Bicarbonate: 29.6 — ABNORMAL HIGH
Chloride: 103
Glucose, Bld: 193 — ABNORMAL HIGH
HCT: 39
Hemoglobin: 13.3
Operator id: 294521
Potassium: 3.5
Sodium: 138
TCO2: 31
pCO2, Ven: 54.1 — ABNORMAL HIGH
pH, Ven: 7.346 — ABNORMAL HIGH

## 2011-01-17 ENCOUNTER — Encounter: Payer: Self-pay | Admitting: Internal Medicine

## 2011-01-17 DIAGNOSIS — I10 Essential (primary) hypertension: Secondary | ICD-10-CM | POA: Insufficient documentation

## 2011-01-17 DIAGNOSIS — E785 Hyperlipidemia, unspecified: Secondary | ICD-10-CM | POA: Insufficient documentation

## 2011-01-17 DIAGNOSIS — N6019 Diffuse cystic mastopathy of unspecified breast: Secondary | ICD-10-CM | POA: Insufficient documentation

## 2011-01-17 DIAGNOSIS — E119 Type 2 diabetes mellitus without complications: Secondary | ICD-10-CM | POA: Insufficient documentation

## 2011-01-17 DIAGNOSIS — J45909 Unspecified asthma, uncomplicated: Secondary | ICD-10-CM | POA: Insufficient documentation

## 2011-01-17 DIAGNOSIS — K219 Gastro-esophageal reflux disease without esophagitis: Secondary | ICD-10-CM | POA: Insufficient documentation

## 2011-01-17 NOTE — Progress Notes (Signed)
  Subjective:    Patient ID: Carla Oconnor, female    DOB: Apr 09, 1951, 60 y.o.   MRN: [id number]  HPI 60 year old female with history of hyperlipidemia, adult onset diabetes, GE reflux, hypertension, asthma, allergic rhinitis, fibrocystic breast disease for evaluation of medical problems. Says that Prevacid causes hives and ibuprofen causes palpitations. Had Cardiolite study 2009. GYN physician is Dr. Duane Lope. History of cardiac catheterization by Dr. Aleen Campi 2003. Right trigger finger September 2009. History of chest wall pain October 2011. Had hysterectomy and right oophorectomy 2003, benign right breast biopsy 1976 and 1966. C-sections 1975 and 1977. Right third trigger finger July 2007. Tetanus immunization 2002.    Review of Systems  Constitutional: Negative.   HENT: Negative.   Eyes: Negative.   Respiratory: Negative.   Cardiovascular: Negative.   Gastrointestinal: Negative.   Genitourinary: Negative.   Musculoskeletal: Negative.   Neurological: Negative.   Hematological: Negative.   Psychiatric/Behavioral: Negative.        Objective:   Physical Exam  Vitals reviewed. Constitutional: She is oriented to person, place, and time. She appears well-developed and well-nourished.  HENT:  Head: Normocephalic and atraumatic.  Right Ear: External ear normal.  Left Ear: External ear normal.  Mouth/Throat: Oropharynx is clear and moist.  Eyes: Conjunctivae and EOM are normal. Pupils are equal, round, and reactive to light.  Neck: Normal range of motion. Neck supple. No JVD present. No thyromegaly present.  Cardiovascular: Normal rate, regular rhythm and normal heart sounds.   No murmur heard. Pulmonary/Chest: Effort normal and breath sounds normal. She has no wheezes.       Breasts normal female  Abdominal: Soft. Bowel sounds are normal. She exhibits no distension and no mass. There is no rebound.  Genitourinary:       Deferred  Lymphadenopathy:    She has no cervical  adenopathy.  Neurological: She is alert and oriented to person, place, and time. She has normal reflexes. No cranial nerve deficit. Coordination normal.  Skin: Skin is warm and dry.  Psychiatric: She has a normal mood and affect. Her behavior is normal. Judgment and thought content normal.          Assessment & Plan:  Hyperlipidemia  GE reflux  Adult onset diabetes mellitus  Hypertension  Fibrocystic breast disease  Asthma  Allergic rhinitis  Plan is to return in 6 months for fasting lipid panel liver functions and hemoglobin A1c and continue with current medication regimen

## 2011-01-31 ENCOUNTER — Other Ambulatory Visit: Payer: Self-pay | Admitting: Internal Medicine

## 2011-01-31 DIAGNOSIS — Z1231 Encounter for screening mammogram for malignant neoplasm of breast: Secondary | ICD-10-CM

## 2011-02-16 ENCOUNTER — Ambulatory Visit (HOSPITAL_COMMUNITY)
Admission: RE | Admit: 2011-02-16 | Discharge: 2011-02-16 | Disposition: A | Discharge status: Home / Self Care | Payer: BC Managed Care – PPO | Source: Ambulatory Visit | Attending: Internal Medicine | Admitting: Internal Medicine

## 2011-02-16 DIAGNOSIS — Z1231 Encounter for screening mammogram for malignant neoplasm of breast: Secondary | ICD-10-CM | POA: Insufficient documentation

## 2011-03-14 ENCOUNTER — Telehealth: Payer: Self-pay | Admitting: Internal Medicine

## 2011-03-14 NOTE — Telephone Encounter (Signed)
Patient scheduled for Tuesday at 4:00pm

## 2011-03-14 NOTE — Telephone Encounter (Signed)
Pt needs to be seen. May need antibiotic.

## 2011-03-15 ENCOUNTER — Encounter: Payer: Self-pay | Admitting: Internal Medicine

## 2011-03-15 ENCOUNTER — Ambulatory Visit (INDEPENDENT_AMBULATORY_CARE_PROVIDER_SITE_OTHER): Payer: BC Managed Care – PPO | Admitting: Internal Medicine

## 2011-03-15 DIAGNOSIS — J40 Bronchitis, not specified as acute or chronic: Secondary | ICD-10-CM

## 2011-03-15 DIAGNOSIS — J111 Influenza due to unidentified influenza virus with other respiratory manifestations: Secondary | ICD-10-CM

## 2011-03-15 DIAGNOSIS — J45909 Unspecified asthma, uncomplicated: Secondary | ICD-10-CM

## 2011-03-15 NOTE — Patient Instructions (Signed)
Use albuterol as necessary for wheezing or shortness of breath. Take Zithromax Z-PAK as corrected for bronchitis. Take Tamiflu twice daily for 5 days for influenza. Take Phenergan 25 mg every 6 hours as needed for nausea. Out of work 2 days. Call if symptoms worsen

## 2011-03-15 NOTE — Progress Notes (Signed)
  Subjective:    Patient ID: AKEILA LANA, female    DOB: Mar 25, 1951, 60 y.o.   MRN: [id number]  HPI patient awakened at 4 AM yesterday with cough. Has had myalgias, productive cough, fever, chills, nausea, and slight sore throat. Decreased appetite. Vomited once. No diarrhea. Had family members visit her over the Thanksgiving holidays that were ill.    Review of Systems     Objective:   Physical Exam HEENT exam: Pharynx slightly injected TMs are clear; neck is supple without adenopathy; chest clear without wheezing        Assessment & Plan:  Influenza  Asthma  Bronchitis  Plan: Clear liquids until nausea resolves. May have crackers or dry toast. Phenergan 25 mg tablets (#30) 1 by mouth every 4 hours when necessary for nausea. Tamiflu 75 mg twice daily for 5 days. Refill albuterol inhaler prn one year with directions as follows:  2 sprays by mouth 4 times daily as needed for wheezing, Zithromax Z-PAK 2 tablets by mouth day one followed by 1 tablet by mouth days 2 through 5. Out of work for 2 days.

## 2011-03-29 ENCOUNTER — Ambulatory Visit: Payer: BC Managed Care – PPO | Admitting: Internal Medicine

## 2011-04-13 ENCOUNTER — Other Ambulatory Visit: Payer: Self-pay | Admitting: Internal Medicine

## 2011-05-12 ENCOUNTER — Other Ambulatory Visit: Payer: Self-pay | Admitting: Internal Medicine

## 2011-06-02 ENCOUNTER — Other Ambulatory Visit: Payer: Self-pay | Admitting: Internal Medicine

## 2011-06-17 ENCOUNTER — Ambulatory Visit (INDEPENDENT_AMBULATORY_CARE_PROVIDER_SITE_OTHER): Payer: BC Managed Care – PPO | Admitting: Internal Medicine

## 2011-06-17 ENCOUNTER — Ambulatory Visit
Admission: RE | Admit: 2011-06-17 | Discharge: 2011-06-17 | Disposition: A | Discharge status: Home / Self Care | Payer: BC Managed Care – PPO | Source: Ambulatory Visit | Attending: Internal Medicine | Admitting: Internal Medicine

## 2011-06-17 ENCOUNTER — Encounter: Payer: Self-pay | Admitting: Internal Medicine

## 2011-06-17 VITALS — BP 120/78 | HR 84 | Temp 97.0°F | Wt 186.0 lb

## 2011-06-17 DIAGNOSIS — IMO0001 Reserved for inherently not codable concepts without codable children: Secondary | ICD-10-CM

## 2011-06-17 DIAGNOSIS — M76899 Other specified enthesopathies of unspecified lower limb, excluding foot: Secondary | ICD-10-CM

## 2011-06-17 DIAGNOSIS — M25559 Pain in unspecified hip: Secondary | ICD-10-CM

## 2011-06-17 DIAGNOSIS — M7918 Myalgia, other site: Secondary | ICD-10-CM

## 2011-06-17 DIAGNOSIS — M549 Dorsalgia, unspecified: Secondary | ICD-10-CM

## 2011-06-17 DIAGNOSIS — M7061 Trochanteric bursitis, right hip: Secondary | ICD-10-CM

## 2011-06-17 LAB — POCT URINALYSIS DIPSTICK
Bilirubin, UA: NEGATIVE
Glucose, UA: NEGATIVE
Ketones, UA: NEGATIVE
Nitrite, UA: NEGATIVE
Protein, UA: NEGATIVE
Spec Grav, UA: 1.015
Urobilinogen, UA: NEGATIVE
pH, UA: 5

## 2011-06-17 NOTE — Patient Instructions (Addendum)
Takes Celebrex 200 mg daily for 30 days. Take Vicodin 5/500 every 8 hours sparingly as needed for pain. If symptoms do not improve, we will refer you to physical therapy. We have ordered an x-ray of your right hip today

## 2011-06-17 NOTE — Progress Notes (Signed)
  Subjective:    Patient ID: Carla Oconnor, female    DOB: 12-07-1950, 61 y.o.   MRN: [id number]  HPI  Patient complaining of pain in her right upper back area parathoracic area along a lower right rib. Says pain is really bilateral. His been present nail for couple of weeks. Doesn't recall any heavy lifting or exertion. No urinary symptoms. Urinalysis today shows only trace LE otherwise negative. Also brought up a different problem complaining of bilateral hip pain worse on the right than the left.    Review of Systems     Objective:   Physical Exam chest clear to auscultation. Point tenderness right parathoracic area along right lower rib line extending toward axilla. Somewhat tender similar area of left parathoracic area. Also, tender bilateral trochanter areas. Complains of pain in thighs with internal and external rotation of both hips.        Assessment & Plan:  Musculoskeletal pain parathoracic area bilaterally  Bilateral trochanteric bursitis  Bilateral hip pain right worse than left  Plan: Will have x-ray of right hip joint. Samples of Celebrex 200 mg daily for 30 days. Vicodin 5/500 #60 one by mouth Q8 hours when necessary pain. If symptoms persist, consider physical therapy.

## 2011-07-19 ENCOUNTER — Other Ambulatory Visit: Payer: Self-pay | Admitting: Internal Medicine

## 2011-07-21 ENCOUNTER — Other Ambulatory Visit: Payer: Self-pay | Admitting: Internal Medicine

## 2011-07-22 ENCOUNTER — Other Ambulatory Visit: Payer: Self-pay | Admitting: Internal Medicine

## 2011-10-07 ENCOUNTER — Other Ambulatory Visit: Payer: Self-pay | Admitting: Internal Medicine

## 2011-10-10 ENCOUNTER — Other Ambulatory Visit: Payer: Self-pay

## 2011-10-10 MED ORDER — CYCLOBENZAPRINE HCL 10 MG PO TABS: 10.0000 mg | ORAL_TABLET | Freq: Every evening | ORAL | Status: DC | PRN

## 2011-10-11 NOTE — Telephone Encounter (Signed)
Completed.

## 2011-12-31 IMAGING — CT CT ABD-PELV W/ CM
2 of 5 series · 17 of 46 positions shown, 19 images · IV contrast (water & 100ml omni 300)
Comparison: CT abdomen and pelvis 05/08/2003.

CLINICAL DATA: Nausea vomiting.  Left upper quadrant pain radiating
to the back.

CT ABDOMEN AND PELVIS WITH CONTRAST
TECHNIQUE: Multidetector CT imaging of the abdomen and pelvis was
performed following the standard protocol during bolus
administration of intravenous contrast.
Contrast: 100 ml Gmnipaque-F00.

[Series 2: routine abdomen · axial · 0.85mm/px · z∈[-450,-34]mm · 14 of 93 slices shown, 16 images]
[im 5/93  soft-tissue]
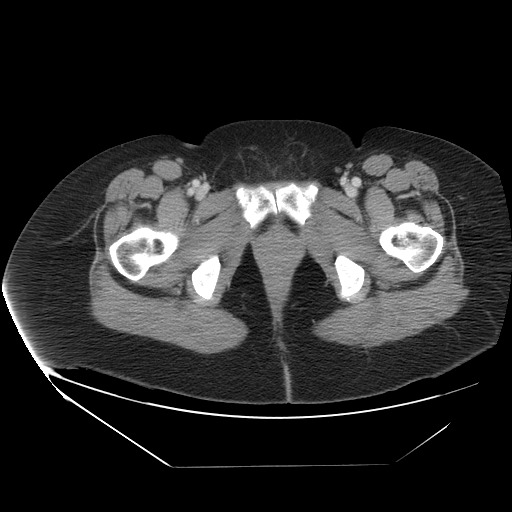
[im 5/93  bone]
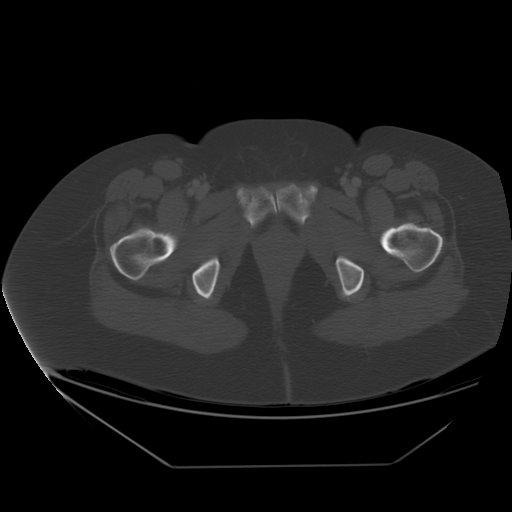
[im 10/93  soft-tissue]
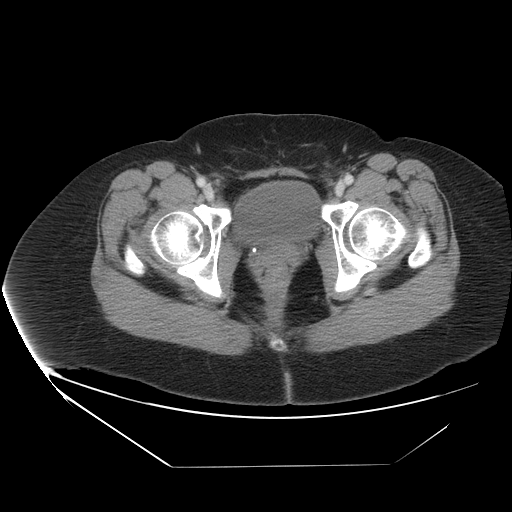
[im 20/93  soft-tissue]
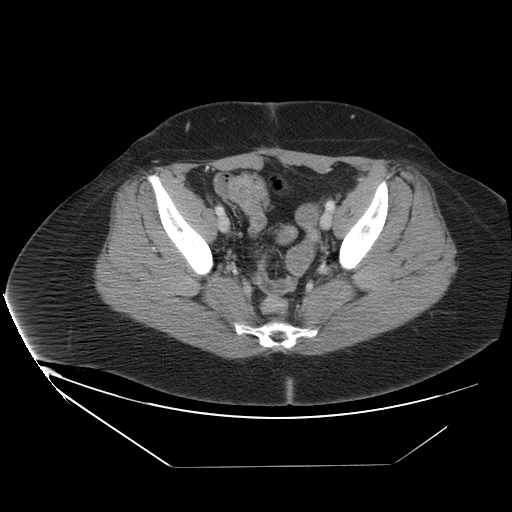
[im 25/93  soft-tissue]
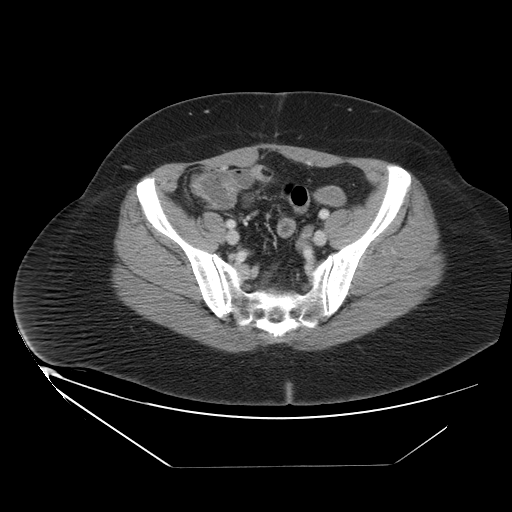
[im 30/93  soft-tissue]
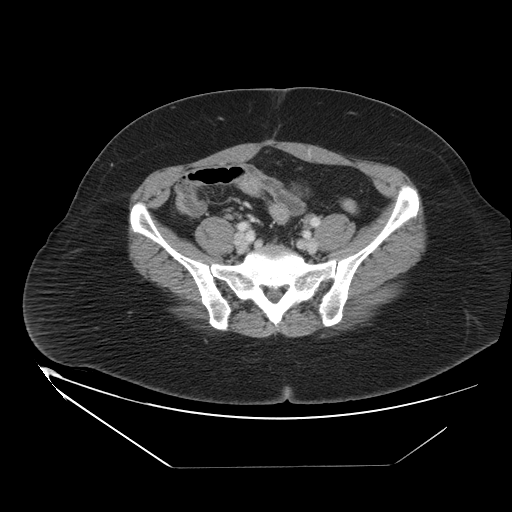
[im 39/93  soft-tissue]
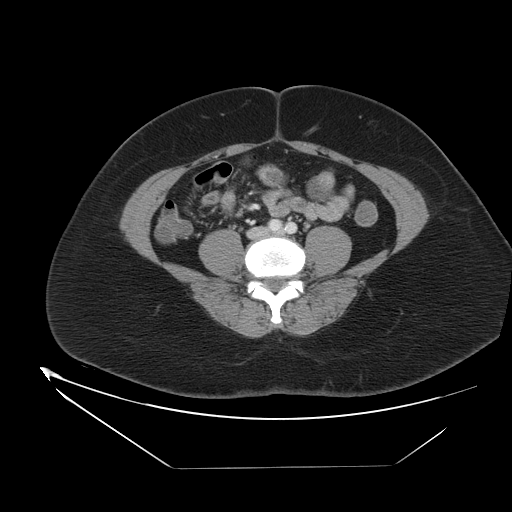
[im 44/93  soft-tissue]
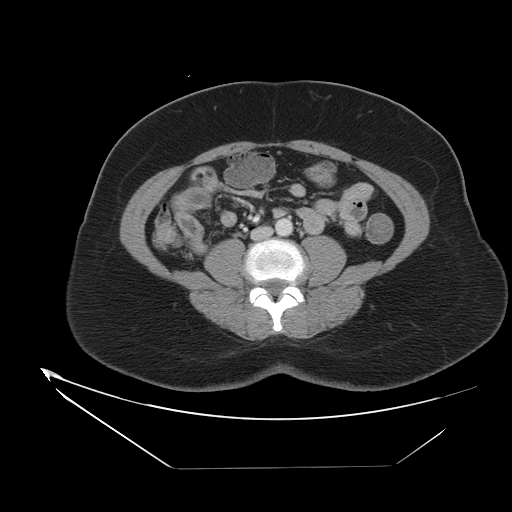
[im 49/93  soft-tissue]
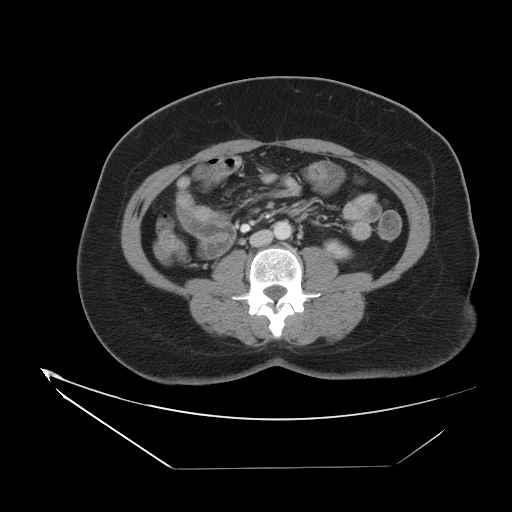
[im 54/93  soft-tissue]
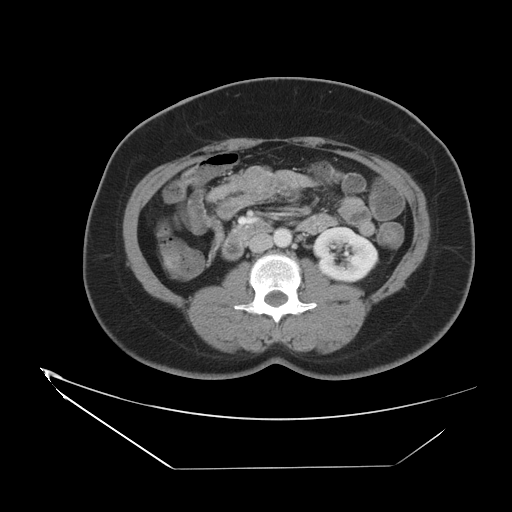
[im 54/93  bone]
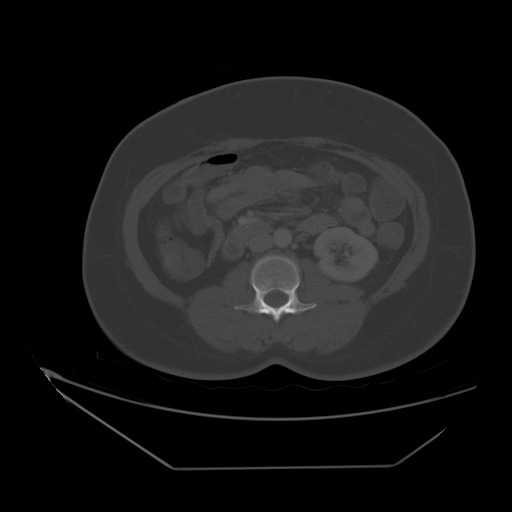
[im 63/93  soft-tissue]
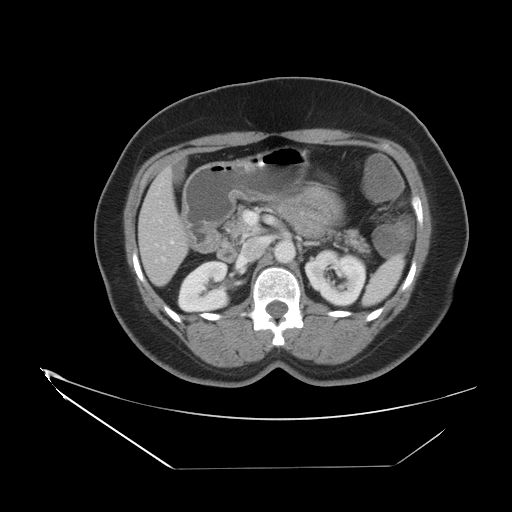
[im 68/93  soft-tissue]
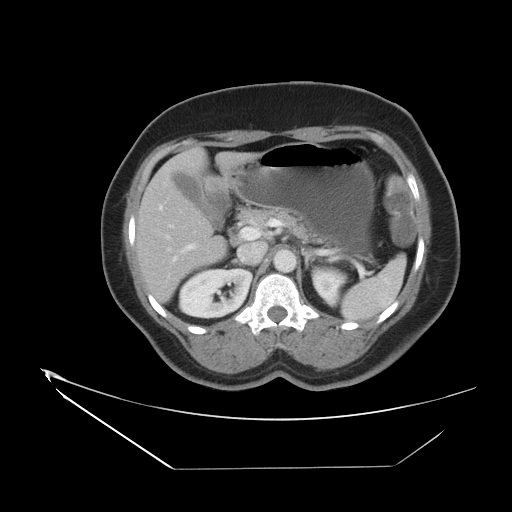
[im 73/93  soft-tissue]
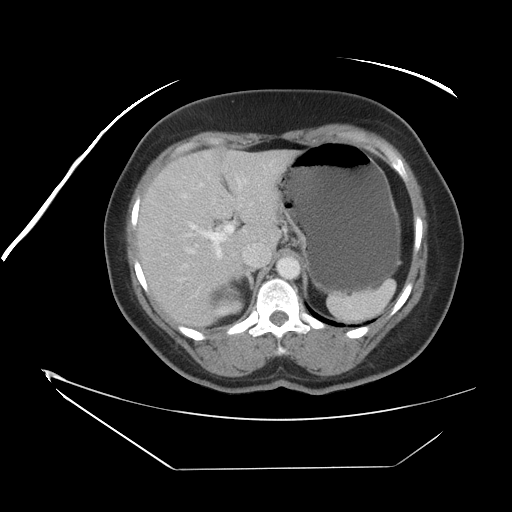
[im 83/93  soft-tissue]
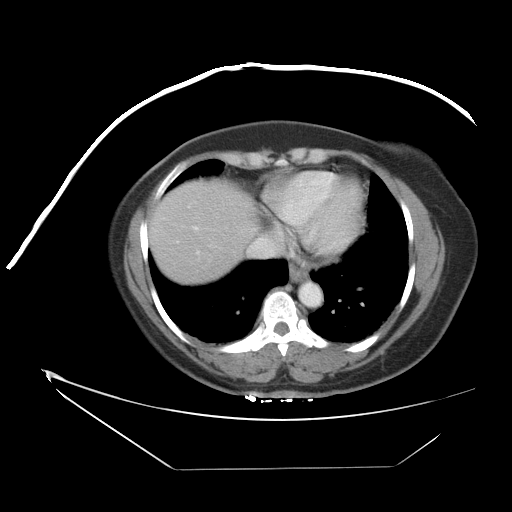
[im 88/93  soft-tissue]
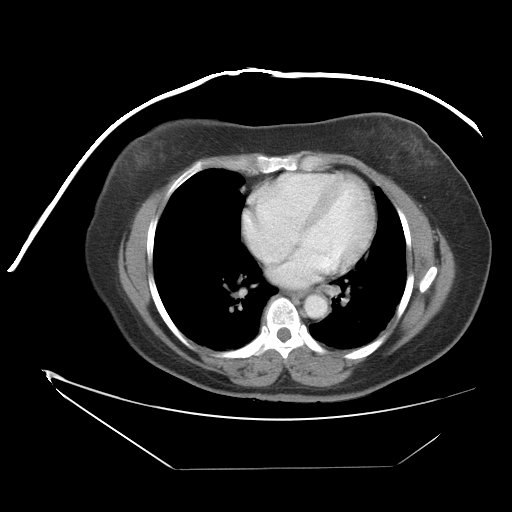

[Series 401: coronal · coronal · 0.94mm/px · 3 of 79 slices shown]
[im 27/79  soft-tissue]
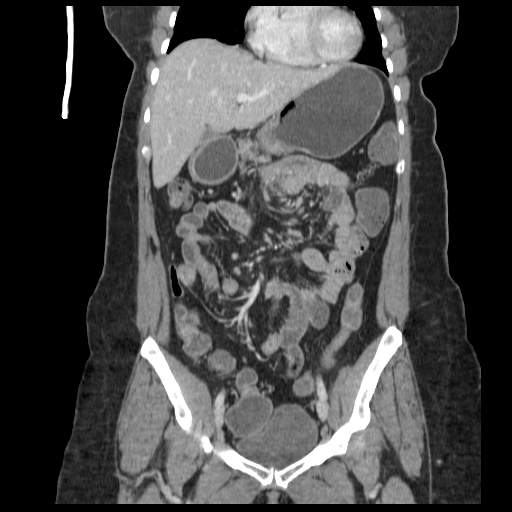
[im 35/79  soft-tissue]
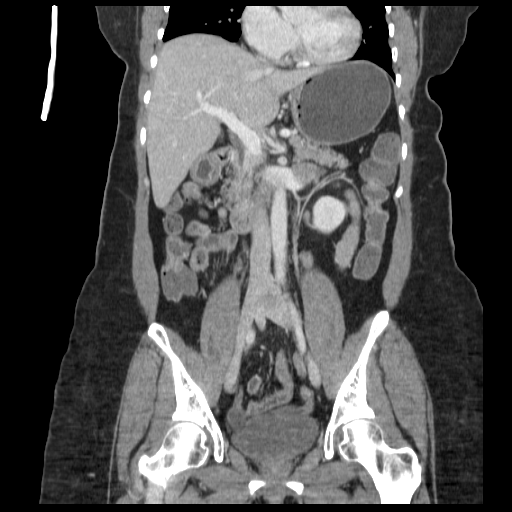
[im 44/79  soft-tissue]
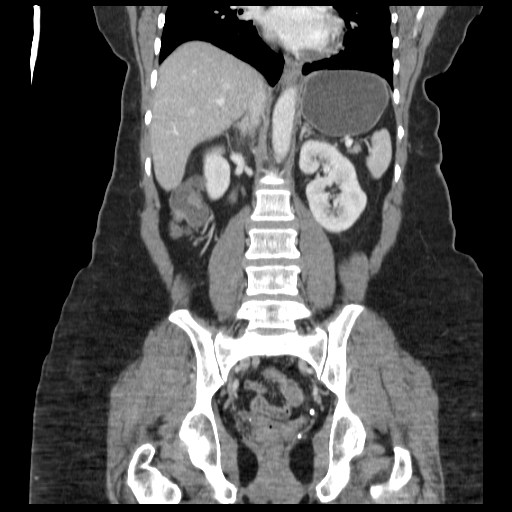

[17 of 46 positions shown; findings below may reference images not displayed]

FINDINGS: Dependent atelectasis is seen in the lung bases.  No
pleural or pericardial effusion.

The liver, gallbladder, biliary tree, adrenal glands, spleen,
pancreas and kidneys all appear normal.  The patient is status post
hysterectomy.  The stomach, small and large bowel and appendix all
appear normal.  No lymphadenopathy or fluid.  No focal bony
abnormality.
IMPRESSION: 1.  No acute finding or finding to explain the patient's symptoms.
2.  Status post hysterectomy.

## 2012-01-09 ENCOUNTER — Other Ambulatory Visit: Payer: Self-pay | Admitting: Internal Medicine

## 2012-01-09 ENCOUNTER — Other Ambulatory Visit: Payer: BC Managed Care – PPO | Admitting: Internal Medicine

## 2012-01-09 DIAGNOSIS — I10 Essential (primary) hypertension: Secondary | ICD-10-CM

## 2012-01-09 DIAGNOSIS — E785 Hyperlipidemia, unspecified: Secondary | ICD-10-CM

## 2012-01-09 DIAGNOSIS — E119 Type 2 diabetes mellitus without complications: Secondary | ICD-10-CM

## 2012-01-09 LAB — CBC WITH DIFFERENTIAL/PLATELET
Basophils Absolute: 0 10*3/uL (ref 0.0–0.1)
Basophils Relative: 1 % (ref 0–1)
Eosinophils Absolute: 0.1 10*3/uL (ref 0.0–0.7)
Eosinophils Relative: 2 % (ref 0–5)
HCT: 34.5 % — ABNORMAL LOW (ref 36.0–46.0)
Hemoglobin: 12.3 g/dL (ref 12.0–15.0)
Lymphocytes Relative: 44 % (ref 12–46)
Lymphs Abs: 1.7 10*3/uL (ref 0.7–4.0)
MCH: 30.8 pg (ref 26.0–34.0)
MCHC: 35.7 g/dL (ref 30.0–36.0)
MCV: 86.5 fL (ref 78.0–100.0)
Monocytes Absolute: 0.4 10*3/uL (ref 0.1–1.0)
Monocytes Relative: 9 % (ref 3–12)
Neutro Abs: 1.8 10*3/uL (ref 1.7–7.7)
Neutrophils Relative %: 44 % (ref 43–77)
Platelets: 249 10*3/uL (ref 150–400)
RBC: 3.99 MIL/uL (ref 3.87–5.11)
RDW: 13.3 % (ref 11.5–15.5)
WBC: 4 10*3/uL (ref 4.0–10.5)

## 2012-01-09 LAB — COMPREHENSIVE METABOLIC PANEL
ALT: 18 U/L (ref 0–35)
AST: 22 U/L (ref 0–37)
Albumin: 4.6 g/dL (ref 3.5–5.2)
Alkaline Phosphatase: 52 U/L (ref 39–117)
BUN: 18 mg/dL (ref 6–23)
CO2: 30 mEq/L (ref 19–32)
Calcium: 9.5 mg/dL (ref 8.4–10.5)
Chloride: 101 mEq/L (ref 96–112)
Creat: 0.94 mg/dL (ref 0.50–1.10)
Glucose, Bld: 117 mg/dL — ABNORMAL HIGH (ref 70–99)
Potassium: 3.6 mEq/L (ref 3.5–5.3)
Sodium: 141 mEq/L (ref 135–145)
Total Bilirubin: 0.8 mg/dL (ref 0.3–1.2)
Total Protein: 6.6 g/dL (ref 6.0–8.3)

## 2012-01-09 LAB — LIPID PANEL
Cholesterol: 172 mg/dL (ref 0–200)
HDL: 55 mg/dL (ref >39)
LDL Cholesterol: 97 mg/dL (ref 0–99)
Total CHOL/HDL Ratio: 3.1 Ratio
Triglycerides: 99 mg/dL (ref <150)
VLDL: 20 mg/dL (ref 0–40)

## 2012-01-09 LAB — HEMOGLOBIN A1C
Hgb A1c MFr Bld: 6.8 % — ABNORMAL HIGH (ref <5.7)
Mean Plasma Glucose: 148 mg/dL — ABNORMAL HIGH (ref <117)

## 2012-01-09 LAB — TSH: TSH: 1.219 u[IU]/mL (ref 0.350–4.500)

## 2012-01-10 ENCOUNTER — Ambulatory Visit (INDEPENDENT_AMBULATORY_CARE_PROVIDER_SITE_OTHER): Payer: BC Managed Care – PPO | Admitting: Internal Medicine

## 2012-01-10 ENCOUNTER — Encounter: Payer: Self-pay | Admitting: Internal Medicine

## 2012-01-10 VITALS — BP 136/70 | HR 84 | Temp 97.3°F | Ht 62.0 in | Wt 190.0 lb

## 2012-01-10 DIAGNOSIS — Z23 Encounter for immunization: Secondary | ICD-10-CM

## 2012-01-10 DIAGNOSIS — I10 Essential (primary) hypertension: Secondary | ICD-10-CM

## 2012-01-10 LAB — POCT URINALYSIS DIPSTICK
Bilirubin, UA: NEGATIVE
Blood, UA: NEGATIVE
Glucose, UA: NEGATIVE
Ketones, UA: NEGATIVE
Leukocytes, UA: NEGATIVE
Nitrite, UA: NEGATIVE
Protein, UA: NEGATIVE
Spec Grav, UA: 1.01
Urobilinogen, UA: NEGATIVE
pH, UA: 7.5

## 2012-01-10 LAB — VITAMIN D 25 HYDROXY (VIT D DEFICIENCY, FRACTURES): Vit D, 25-Hydroxy: 34 ng/mL (ref 30–89)

## 2012-01-10 NOTE — Progress Notes (Signed)
Subjective:    Patient ID: Carla Oconnor, female    DOB: Jun 23, 1950, 61 y.o.   MRN: [id number]  HPI  Having carpal tunnel release right hand end of October by Dr. Teressa Senter. C/o left elbow pain. Tender lateral epicondyle. Having lots of heartburn despite Protonix. Has been to [facility] before for heartburn but has been several years. Has been on Protonix for a couple of years and does get relief with double dose therapy. Hgb AIC is 6.8%. History of hyserectomy with uni-ophorectomy. Needs Zostavax, influenza and tetanus immunizations.   She has a history of diabetes mellitus adult onset type II, GE reflux, asthma, allergic rhinitis, fibrocystic breast disease. Says she has been unable to tolerate Prevacid in the past because of hives. Says ibuprofen causes palpitations.  Had Cardiolite study in 2009. History of cardiac catheterization by Dr. Aleen Campi 2003. GYN physician is Dr. Duane Lope. Had hysterectomy and right oophorectomy in 2003. Benign right breast biopsy 1976 and 1966. C-sections 1975 and 1977.  Right trigger finger release September 2009. Right third trigger finger July 2007.  Family history: Father died at age 48 with stroke  Mother with history of diabetes and hypertension. One brother in fairly good health 2 has been some trouble with crack  4 sisters to her whole sisters and 2 her half sisters. One sister died of kidney failure and breast cancer one sister with history of diabetes and coronary artery disease.  Social history: Has one set of twins: a boy and girl in their 46s and another son  Patient is married. Husband is a English as a second language teacher. 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 Has never smoked. No alcohol consumption.    Review of Systems  Constitutional: Positive for fatigue.  HENT: Negative.   Eyes: Negative.   Respiratory:       History of allergic rhinitis and asthma  Gastrointestinal:       More severe GE reflux symptoms  recently  Genitourinary: Negative.   Musculoskeletal: Negative.   Neurological: Negative.   Hematological: Negative.   Psychiatric/Behavioral:       Anxious       Objective:   Physical Exam  Vitals reviewed. Constitutional: She is oriented to person, place, and time. She appears well-developed and well-nourished. No distress.  HENT:  Head: Normocephalic and atraumatic.  Right Ear: External ear normal.  Left Ear: External ear normal.  Mouth/Throat: Oropharynx is clear and moist. No oropharyngeal exudate.  Eyes: Conjunctivae normal and EOM are normal. Pupils are equal, round, and reactive to light. Right eye exhibits no discharge. Left eye exhibits no discharge. No scleral icterus.  Neck: Neck supple. No JVD present. No thyromegaly present.  Cardiovascular: Normal rate, regular rhythm, normal heart sounds and intact distal pulses.   No murmur heard. Pulmonary/Chest: Breath sounds normal. She has no wheezes. She has no rales.       Breasts normal female  Abdominal: Bowel sounds are normal. She exhibits no distension and no mass. There is no tenderness. There is no rebound and no guarding.  Genitourinary:       Deferred to GYN  Musculoskeletal: Normal range of motion. She exhibits no edema.  Lymphadenopathy:    She has no cervical adenopathy.  Neurological: She is alert and oriented to person, place, and time. She has normal reflexes. Coordination normal.  Skin: Skin is warm and dry. No rash noted. She is not diaphoretic.  Psychiatric: She has a normal mood and affect. Her behavior  is normal. Judgment and thought content normal.          Assessment & Plan:  Worsening GE reflux in etiology unclear. Had H. pylori to lab work. Patient is seeing Dr. Evette Cristal in the past and we will arrange for her to followup with him. Currently taking 2 Protonix daily.  Hypertension-stable  Hyperlipidemia-stable  Asthma-stable  Type 2 diabetes mellitus-stable  Health maintenance: Influenza  immunization given. Tetanus immunization given. Note given for patient to have a Zostavax vaccine at pharmacy.  Return in 6 months for office visit hemoglobin A1c, fasting lipid panel liver functions and blood pressure check. Okay to have surgery by Dr. Teressa Senter.

## 2012-01-12 LAB — HELICOBACTER PYLORI  ANTIBODY, IGM: Helicobacter pylori, IgM: 2.7 U/mL (ref <9.0)

## 2012-02-07 ENCOUNTER — Other Ambulatory Visit: Payer: Self-pay | Admitting: Orthopedic Surgery

## 2012-02-13 ENCOUNTER — Encounter (HOSPITAL_BASED_OUTPATIENT_CLINIC_OR_DEPARTMENT_OTHER)
Admission: RE | Admit: 2012-02-13 | Discharge: 2012-02-13 | Disposition: A | Discharge status: Home / Self Care | Payer: BC Managed Care – PPO | Source: Ambulatory Visit | Attending: Orthopedic Surgery | Admitting: Orthopedic Surgery

## 2012-02-13 ENCOUNTER — Encounter (HOSPITAL_BASED_OUTPATIENT_CLINIC_OR_DEPARTMENT_OTHER): Payer: Self-pay | Admitting: *Deleted

## 2012-02-13 ENCOUNTER — Other Ambulatory Visit: Payer: Self-pay

## 2012-02-13 LAB — BASIC METABOLIC PANEL
BUN: 17 mg/dL (ref 6–23)
CO2: 33 mEq/L — ABNORMAL HIGH (ref 19–32)
Calcium: 9.5 mg/dL (ref 8.4–10.5)
Chloride: 98 mEq/L (ref 96–112)
Creatinine, Ser: 0.96 mg/dL (ref 0.50–1.10)
GFR calc Af Amer: 72 mL/min — ABNORMAL LOW (ref >90)
GFR calc non Af Amer: 62 mL/min — ABNORMAL LOW (ref >90)
Glucose, Bld: 117 mg/dL — ABNORMAL HIGH (ref 70–99)
Potassium: 3.2 mEq/L — ABNORMAL LOW (ref 3.5–5.1)
Sodium: 139 mEq/L (ref 135–145)

## 2012-02-13 NOTE — Progress Notes (Signed)
Pt coming today for BMET and EKG. Bring all medications the day of surgery.

## 2012-02-15 NOTE — H&P (Signed)
Carla Oconnor is an 61 y.o. female.   Chief Complaint: c/o STS right ring finger HPI: Carla Oconnor returns today complaining of recurrence of triggering of the right ring finger. She was last seen on 05-11-11. At that time she underwent injection into the right ring finger flexor sheath. She did well until about 2 months ago when she had a recurrence of the triggering. She has been trying to deal with this but states it has become intolerable  Past Medical History  Diagnosis Date  . Hypertension   . S/P hysterectomy   . GERD (gastroesophageal reflux disease)   . Hyperlipidemia   . Fibrocystic breast   . Asthma     as child and seasonal  . Neuromuscular disorder     tennis elbow on right and left arm    Past Surgical History  Procedure Date  . Cesarean section   . Breast surgery     biopsy rt breast-1960- benign.Left breast 1975  - lumpectomy - benign  . Abdominal hysterectomy     2003- still has left ovary    Family History  Problem Relation Age of Onset  . Stroke Father    Social History:  reports that she has never smoked. She has never used smokeless tobacco. She reports that she does not drink alcohol or use illicit drugs.  Allergies:  Allergies  Allergen Reactions  . Lansoprazole Hives  . Ibuprofen   . Percocet (Oxycodone-Acetaminophen) Rash    No prescriptions prior to admission    Results for orders placed during the hospital encounter of 02/16/12 (from the past 48 hour(s))  BASIC METABOLIC PANEL     Status: Abnormal   Collection Time   02/13/12  4:00 PM      Component Value Range Comment   Sodium 139  135 - 145 mEq/L    Potassium 3.2 (*) 3.5 - 5.1 mEq/L    Chloride 98  96 - 112 mEq/L    CO2 33 (*) 19 - 32 mEq/L    Glucose, Bld 117 (*) 70 - 99 mg/dL    BUN 17  6 - 23 mg/dL    Creatinine, Ser 9.56  0.50 - 1.10 mg/dL    Calcium 9.5  8.4 - 21.3 mg/dL    GFR calc non Af Amer 62 (*) >90 mL/min    GFR calc Af Amer 72 (*) >90 mL/min     No results  found.   Pertinent items are noted in HPI.  Height 5\' 2"  (1.575 m), weight 83.462 kg (184 lb).  General appearance: alert Head: Normocephalic, without obvious abnormality Neck: supple, symmetrical, trachea midline Resp: clear to auscultation bilaterally Cardio: regular rate and rhythm GI: normal findings: bowel sounds normal Extremities: On exam there is active triggering of the right ring finger. Neurovascularly she is intact. She has no triggering of any of the other digits. She also has pain at the left elbow lateral epicondyle. She has all the provocative signs of lateral epicondylitis with pain with resisted wrist flexion, middle finger extension, pronation and supination.  Pulses: 2+ and symmetric Skin: normal Neurologic: Grossly normal    Assessment/Plan Impression: STS right ring finger  Plan:Release right ring finger A-1 pulley.The procedure, risks,benefits and post-op course were discussed with the patient at length and they were in agreement with the plan.   DASNOIT,Shawnese Magner J 02/15/2012, 3:59 PM     H&P documentation: 02/16/2012  -History and Physical Reviewed  -Patient has been re-examined  -No change in the plan of care  Wyn Forster, MD

## 2012-02-16 ENCOUNTER — Encounter (HOSPITAL_BASED_OUTPATIENT_CLINIC_OR_DEPARTMENT_OTHER)
Admission: RE | Disposition: A | Discharge status: Home / Self Care | Payer: Self-pay | Source: Ambulatory Visit | Attending: Orthopedic Surgery

## 2012-02-16 ENCOUNTER — Encounter (HOSPITAL_BASED_OUTPATIENT_CLINIC_OR_DEPARTMENT_OTHER): Payer: Self-pay | Admitting: Anesthesiology

## 2012-02-16 ENCOUNTER — Encounter (HOSPITAL_BASED_OUTPATIENT_CLINIC_OR_DEPARTMENT_OTHER): Payer: Self-pay | Admitting: *Deleted

## 2012-02-16 ENCOUNTER — Other Ambulatory Visit: Payer: Self-pay | Admitting: Internal Medicine

## 2012-02-16 ENCOUNTER — Encounter (HOSPITAL_BASED_OUTPATIENT_CLINIC_OR_DEPARTMENT_OTHER): Payer: Self-pay | Admitting: Certified Registered Nurse Anesthetist

## 2012-02-16 ENCOUNTER — Ambulatory Visit (HOSPITAL_BASED_OUTPATIENT_CLINIC_OR_DEPARTMENT_OTHER)
Admission: RE | Admit: 2012-02-16 | Discharge: 2012-02-16 | Disposition: A | Discharge status: Home / Self Care | Payer: BC Managed Care – PPO | Source: Ambulatory Visit | Attending: Orthopedic Surgery | Admitting: Orthopedic Surgery

## 2012-02-16 ENCOUNTER — Ambulatory Visit (HOSPITAL_BASED_OUTPATIENT_CLINIC_OR_DEPARTMENT_OTHER): Payer: BC Managed Care – PPO | Admitting: Anesthesiology

## 2012-02-16 DIAGNOSIS — Z01810 Encounter for preprocedural cardiovascular examination: Secondary | ICD-10-CM | POA: Insufficient documentation

## 2012-02-16 DIAGNOSIS — Z01812 Encounter for preprocedural laboratory examination: Secondary | ICD-10-CM | POA: Insufficient documentation

## 2012-02-16 DIAGNOSIS — M65849 Other synovitis and tenosynovitis, unspecified hand: Secondary | ICD-10-CM | POA: Insufficient documentation

## 2012-02-16 DIAGNOSIS — M65839 Other synovitis and tenosynovitis, unspecified forearm: Secondary | ICD-10-CM | POA: Insufficient documentation

## 2012-02-16 DIAGNOSIS — M6530 Trigger finger, unspecified finger: Secondary | ICD-10-CM | POA: Insufficient documentation

## 2012-02-16 HISTORY — PX: TRIGGER FINGER RELEASE: SHX641

## 2012-02-16 HISTORY — DX: Unspecified asthma, uncomplicated: J45.909

## 2012-02-16 HISTORY — DX: Myoneural disorder, unspecified: G70.9

## 2012-02-16 LAB — POCT HEMOGLOBIN-HEMACUE: Hemoglobin: 12 g/dL (ref 12.0–15.0)

## 2012-02-16 SURGERY — RELEASE, A1 PULLEY, FOR TRIGGER FINGER
Anesthesia: Monitor Anesthesia Care | Site: Finger | Laterality: Right | Wound class: Clean

## 2012-02-16 MED ORDER — TRAMADOL HCL 50 MG PO TABS: ORAL_TABLET | ORAL | Status: DC

## 2012-02-16 MED ORDER — LACTATED RINGERS IV SOLN
INTRAVENOUS | Status: DC
  Administered 2012-02-16: 08:00:00 via INTRAVENOUS

## 2012-02-16 MED ORDER — CHLORHEXIDINE GLUCONATE 4 % EX LIQD: 60.0000 mL | Freq: Once | CUTANEOUS | Status: DC

## 2012-02-16 MED ORDER — PROPOFOL 10 MG/ML IV EMUL
INTRAVENOUS | Status: DC | PRN
  Administered 2012-02-16: 200 ug/kg/min via INTRAVENOUS

## 2012-02-16 MED ORDER — FENTANYL CITRATE 0.05 MG/ML IJ SOLN
INTRAMUSCULAR | Status: DC | PRN
  Administered 2012-02-16: 50 ug via INTRAVENOUS

## 2012-02-16 MED ORDER — ONDANSETRON HCL 4 MG/2ML IJ SOLN
INTRAMUSCULAR | Status: DC | PRN
  Administered 2012-02-16: 4 mg via INTRAVENOUS

## 2012-02-16 MED ORDER — LIDOCAINE HCL (PF) 2 % IJ SOLN
INTRAMUSCULAR | Status: DC | PRN
  Administered 2012-02-16: 2 mL

## 2012-02-16 MED ORDER — MIDAZOLAM HCL 5 MG/5ML IJ SOLN
INTRAMUSCULAR | Status: DC | PRN
  Administered 2012-02-16: 2 mg via INTRAVENOUS

## 2012-02-16 MED ORDER — FENTANYL CITRATE 0.05 MG/ML IJ SOLN
25.0000 ug | INTRAMUSCULAR | Status: DC | PRN
  Administered 2012-02-16: 50 ug via INTRAVENOUS

## 2012-02-16 SURGICAL SUPPLY — 34 items
BLADE SURG 15 STRL LF DISP TIS (BLADE) ×1 IMPLANT
BLADE SURG 15 STRL SS (BLADE) ×2
BNDG CMPR 9X4 STRL LF SNTH (GAUZE/BANDAGES/DRESSINGS)
BNDG CMPR MD 5X2 ELC HKLP STRL (GAUZE/BANDAGES/DRESSINGS) ×1
BNDG ELASTIC 2 VLCR STRL LF (GAUZE/BANDAGES/DRESSINGS) ×2 IMPLANT
BNDG ESMARK 4X9 LF (GAUZE/BANDAGES/DRESSINGS) IMPLANT
BRUSH SCRUB EZ PLAIN DRY (MISCELLANEOUS) ×2 IMPLANT
CLOTH BEACON ORANGE TIMEOUT ST (SAFETY) ×2 IMPLANT
CORDS BIPOLAR (ELECTRODE) ×2 IMPLANT
COVER MAYO STAND STRL (DRAPES) ×2 IMPLANT
COVER TABLE BACK 60X90 (DRAPES) ×2 IMPLANT
CUFF TOURNIQUET SINGLE 18IN (TOURNIQUET CUFF) ×2 IMPLANT
DECANTER SPIKE VIAL GLASS SM (MISCELLANEOUS) IMPLANT
DRAPE EXTREMITY T 121X128X90 (DRAPE) ×2 IMPLANT
DRAPE SURG 17X23 STRL (DRAPES) ×2 IMPLANT
GAUZE SPONGE 4X4 12PLY STRL LF (GAUZE/BANDAGES/DRESSINGS) ×4 IMPLANT
GAUZE XEROFORM 1X8 LF (GAUZE/BANDAGES/DRESSINGS) ×2 IMPLANT
GLOVE BIO SURGEON STRL SZ 6.5 (GLOVE) ×1 IMPLANT
GLOVE BIO SURGEON STRL SZ7 (GLOVE) ×1 IMPLANT
GLOVE BIOGEL M STRL SZ7.5 (GLOVE) ×2 IMPLANT
GLOVE ORTHO TXT STRL SZ7.5 (GLOVE) ×2 IMPLANT
GOWN PREVENTION PLUS XLARGE (GOWN DISPOSABLE) ×3 IMPLANT
GOWN STRL REIN XL XLG (GOWN DISPOSABLE) ×4 IMPLANT
NEEDLE 27GAX1X1/2 (NEEDLE) IMPLANT
PACK BASIN DAY SURGERY FS (CUSTOM PROCEDURE TRAY) ×2 IMPLANT
PAD CAST 4YDX4 CTTN HI CHSV (CAST SUPPLIES) ×1 IMPLANT
PADDING CAST COTTON 4X4 STRL (CAST SUPPLIES) ×2
SPONGE GAUZE 4X4 12PLY (GAUZE/BANDAGES/DRESSINGS) ×2 IMPLANT
STOCKINETTE 4X48 STRL (DRAPES) ×2 IMPLANT
STRIP CLOSURE SKIN 1/2X4 (GAUZE/BANDAGES/DRESSINGS) ×1 IMPLANT
SYR CONTROL 10ML LL (SYRINGE) IMPLANT
TOWEL OR 17X24 6PK STRL BLUE (TOWEL DISPOSABLE) ×2 IMPLANT
UNDERPAD 30X30 INCONTINENT (UNDERPADS AND DIAPERS) ×2 IMPLANT
WATER STERILE IRR 1000ML POUR (IV SOLUTION) IMPLANT

## 2012-02-16 NOTE — Anesthesia Procedure Notes (Signed)
Procedure Name: MAC Date/Time: 02/16/2012 8:47 AM Performed by: Verlan Friends Pre-anesthesia Checklist: Patient identified, Timeout performed, Emergency Drugs available, Suction available and Patient being monitored Oxygen Delivery Method: Simple face mask Placement Confirmation: positive ETCO2

## 2012-02-16 NOTE — Transfer of Care (Signed)
Immediate Anesthesia Transfer of Care Note  Patient: Carla Oconnor  Procedure(s) Performed: Procedure(s) (LRB) with comments: RELEASE TRIGGER FINGER/A-1 PULLEY (Right) - RIGHT RING FINGER   Patient Location: PACU  Anesthesia Type:MAC  Level of Consciousness: awake, alert , oriented and patient cooperative  Airway & Oxygen Therapy: Patient Spontanous Breathing and Patient connected to face mask oxygen  Post-op Assessment: Report given to PACU RN and Post -op Vital signs reviewed and stable  Post vital signs: Reviewed and stable  Complications: No apparent anesthesia complications

## 2012-02-16 NOTE — Anesthesia Preprocedure Evaluation (Signed)
Anesthesia Evaluation  Patient identified by MRN, date of birth, ID band Patient awake    Reviewed: Allergy & Precautions, H&P , NPO status , Patient's Chart, lab work & pertinent test results  Airway Mallampati: III TM Distance: >3 FB Neck ROM: Full    Dental No notable dental hx. (+) Teeth Intact and Dental Advisory Given   Pulmonary neg pulmonary ROS,  breath sounds clear to auscultation  Pulmonary exam normal       Cardiovascular hypertension, On Medications negative cardio ROS  Rhythm:Regular Rate:Normal     Neuro/Psych negative neurological ROS  negative psych ROS   GI/Hepatic Neg liver ROS, GERD-  Medicated and Controlled,  Endo/Other  negative endocrine ROS  Renal/GU negative Renal ROS  negative genitourinary   Musculoskeletal   Abdominal   Peds  Hematology negative hematology ROS (+)   Anesthesia Other Findings   Reproductive/Obstetrics negative OB ROS                           Anesthesia Physical Anesthesia Plan  ASA: II  Anesthesia Plan: MAC   Post-op Pain Management:    Induction: Intravenous  Airway Management Planned: Simple Face Mask  Additional Equipment:   Intra-op Plan:   Post-operative Plan:   Informed Consent: I have reviewed the patients History and Physical, chart, labs and discussed the procedure including the risks, benefits and alternatives for the proposed anesthesia with the patient or authorized representative who has indicated his/her understanding and acceptance.   Dental advisory given  Plan Discussed with: CRNA  Anesthesia Plan Comments:         Anesthesia Quick Evaluation

## 2012-02-16 NOTE — Anesthesia Postprocedure Evaluation (Signed)
  Anesthesia Post-op Note  Patient: Carla Oconnor  Procedure(s) Performed: Procedure(s) (LRB) with comments: RELEASE TRIGGER FINGER/A-1 PULLEY (Right) - RIGHT RING FINGER   Patient Location: PACU  Anesthesia Type:MAC  Level of Consciousness: awake, alert  and oriented  Airway and Oxygen Therapy: Patient Spontanous Breathing  Post-op Pain: none  Post-op Assessment: Post-op Vital signs reviewed, Patient's Cardiovascular Status Stable, Respiratory Function Stable, Patent Airway and No signs of Nausea or vomiting  Post-op Vital Signs: Reviewed and stable  Complications: No apparent anesthesia complications

## 2012-02-16 NOTE — Op Note (Signed)
[id number] 

## 2012-02-16 NOTE — Brief Op Note (Signed)
02/16/2012  9:01 AM  PATIENT:  Carla Oconnor  61 y.o. female  PRE-OPERATIVE DIAGNOSIS:  RECURRENT STS RIGHT RING FINGER   POST-OPERATIVE DIAGNOSIS:  RECURRENT STS RIGHT RING FINGER  PROCEDURE:  Procedure(s) (LRB) with comments: RELEASE TRIGGER FINGER/A-1 PULLEY (Right) - RIGHT RING FINGER   SURGEON:  Surgeon(s) and Role:    * Wyn Forster., MD - Primary  PHYSICIAN ASSISTANT:   ASSISTANTS: Mallory Shirk.A-C   ANESTHESIA:   MAC  EBL:     BLOOD ADMINISTERED:none  DRAINS: none   LOCAL MEDICATIONS USED:  XYLOCAINE   SPECIMEN:  No Specimen  DISPOSITION OF SPECIMEN:  N/A  COUNTS:  YES  TOURNIQUET:  * Missing tourniquet times found for documented tourniquets in log:  [id number] *  DICTATION: .Other Dictation: Dictation Number [id number]  PLAN OF CARE: Discharge to home after PACU  PATIENT DISPOSITION:  PACU - hemodynamically stable.

## 2012-02-17 ENCOUNTER — Encounter (HOSPITAL_BASED_OUTPATIENT_CLINIC_OR_DEPARTMENT_OTHER): Payer: Self-pay | Admitting: Orthopedic Surgery

## 2012-02-17 NOTE — Op Note (Signed)
NAMERICK, WARNICK             ACCOUNT NO.:  [id number]  MEDICAL RECORD NO.:  [id number]  LOCATION:                                 FACILITY:  PHYSICIAN:  Katy Fitch. [place] Stell, M.D. DATE OF BIRTH:  [birth year]  DATE OF PROCEDURE:  02/16/2012 DATE OF DISCHARGE:                              OPERATIVE REPORT   PREOPERATIVE DIAGNOSIS:  Chronic stenosing tenosynovitis, right ring finger A1 pulley.  POSTOPERATIVE DIAGNOSIS:  Chronic stenosing tenosynovitis, right ring finger A1 pulley.  OPERATION:  Release of right ring finger A1 pulley.  OPERATING SURGEON:  Katy Fitch. Chayla Shands, M.D.  ASSISTANT:  Marveen Reeks. Dasnoit, PA-C  ANESTHESIA:  2% lidocaine flexor sheath block and field block, right palm supplemented by IV sedation.  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  She had failed prior injection.  Due to a failure to respond to nonoperative measures, we advised proceeding with release of the A1 pulley.  Questions regarding the surgery were invited and answered in detail.  PROCEDURE:  Carla Oconnor was brought to room #6 of the [facility] and placed in supine position on the operating table.  Following IV sedation, the right hand was prepped with Betadine followed by injection of 2 mL of 2% lidocaine into the path of intended incision and flexor sheath.  The right arm and hand were then prepped with Betadine soap and solution, sterilely draped.  A pneumatic tourniquet was applied to the proximal right brachium.  Following exsanguination of right arm with Esmarch bandage, the arterial tourniquet was inflated to 220 mmHg.  Following routine surgical time-out, procedure commenced with short oblique incision directly over the palpably thickened A1 pulley. Subcutaneous tissues were carefully divided taking  care to release the pretendinous fibers of the palmar fascia.  The pulley was noted to have a small ganglion growing.  This was debrided with a rongeur.  The pulley was released with scalpel and scissors followed by identification of the flexor tendons.  There was a small A-0 pulley that was also released. The tendon was delivered and found to be invested in moderate degree of inflammatory tenosynovium.  There was a fibrocartilaginous nodule in the superficialis tendon that should resolve over time.  Thereafter, free range of motion of the ring finger was recovered.  The wound was repaired with intradermal 4-0 Prolene.  Carla Oconnor was placed in compressive dressing with Steri-Strips, sterile gauze, and Ace wrap.  She has been advised she may advance to a Band-Aid after 3 days if she chooses to do so.  We will see her back for follow up in the office in 1 week for suture removal.     Katy Fitch. Acsa Estey, M.D.     RVS/MEDQ  D:  02/16/2012  T:  02/17/2012  Job:  [id number]

## 2012-02-21 ENCOUNTER — Other Ambulatory Visit: Payer: BC Managed Care – PPO | Admitting: Internal Medicine

## 2012-02-21 DIAGNOSIS — E119 Type 2 diabetes mellitus without complications: Secondary | ICD-10-CM

## 2012-02-21 LAB — HEMOGLOBIN A1C
Hgb A1c MFr Bld: 6.6 % — ABNORMAL HIGH (ref <5.7)
Mean Plasma Glucose: 143 mg/dL — ABNORMAL HIGH (ref <117)

## 2012-02-23 ENCOUNTER — Encounter: Payer: Self-pay | Admitting: Internal Medicine

## 2012-02-23 ENCOUNTER — Ambulatory Visit (INDEPENDENT_AMBULATORY_CARE_PROVIDER_SITE_OTHER): Payer: BC Managed Care – PPO | Admitting: Internal Medicine

## 2012-02-23 VITALS — BP 138/80 | HR 68 | Temp 97.5°F | Wt 191.0 lb

## 2012-02-23 DIAGNOSIS — G4700 Insomnia, unspecified: Secondary | ICD-10-CM

## 2012-02-23 DIAGNOSIS — E119 Type 2 diabetes mellitus without complications: Secondary | ICD-10-CM

## 2012-02-23 NOTE — Progress Notes (Signed)
  Subjective:    Patient ID: MACAYLAH BATER, female    DOB: 1950/05/14, 61 y.o.   MRN: [id number]  HPI 61 year old black female recently had carpal  tunnel release per Dr. Teressa Senter. In today for diabetes recheck. Has been on glipizide 2.5 mg daily. Hemoglobin A1c has improved from 6.8% to 6.6%. Average plasma glucose is 143. She's been checking Accu-Cheks a couple of times a day every other day. Says it usually running about 110.  She's not eating on a regular basis. Frequently skips lunch. Eats oatmeal for breakfast. Needs to see dietitian regarding diabetic diet. Also out of Klonopin which helps her sleep. Refill Klonopin today 0.5 mg #30 with 1 refill to take hs.    Review of Systems     Objective:   Physical Exam        Assessment & Plan:

## 2012-02-23 NOTE — Progress Notes (Signed)
  Subjective:    Patient ID: Carla Oconnor, female    DOB: 25-Aug-1950, 61 y.o.   MRN: [id number]  HPI At last visit,  was placed on glipizide 2.5 mg daily. Wants to see  dietician. Just had carpal tunnel release per Dr. Teressa Senter. Doing well postoperatively. Hemoglobin A1c 6.6% and previously was 6.8%. Average plasma glucose 143. Patient says when she checks it which is about every other day at running about 110. Going to increase glipizide to 5 mg daily XL and see her again in 3 months. She is also running out of her Klonopin which she takes for anxiety and insomnia.    Review of Systems     Objective:   Physical Exam chest clear to auscultation; cardiac exam regular rate and rhythm normal S1 and S2; extremities without edema, diabetic foot exam no ulcers, pulses are normal        Assessment & Plan:  Type 2 diabetes mellitus  Insomnia  Plan: Klonopin 0.5 mg ( #30) 1 by mouth each bedtime when necessary sleep  Increase glipizide to 5 mg XL every morning return in 3 months for office visit hemoglobin A1c

## 2012-02-23 NOTE — Patient Instructions (Addendum)
Increase glipizide to 5 mg daily. Have refill Klonopin 0.5 mg to take at bedtime when necessary sleep. Return in 3 months for office visit hemoglobin A1c. Refer to dietitian

## 2012-03-06 ENCOUNTER — Encounter: Payer: BC Managed Care – PPO | Attending: Internal Medicine | Admitting: *Deleted

## 2012-03-06 DIAGNOSIS — E119 Type 2 diabetes mellitus without complications: Secondary | ICD-10-CM | POA: Insufficient documentation

## 2012-03-06 DIAGNOSIS — Z713 Dietary counseling and surveillance: Secondary | ICD-10-CM | POA: Insufficient documentation

## 2012-03-13 ENCOUNTER — Encounter: Payer: Self-pay | Admitting: *Deleted

## 2012-03-13 NOTE — Progress Notes (Signed)
  Patient was seen on 03/06/2012 for the first of a series of three diabetes self-management courses at the [facility]. Current A1c = 6.8% on 01/10/12 The following learning objectives were met by the patient during this course:   Defines the role of glucose and insulin  Identifies type of diabetes and pathophysiology  Defines the diagnostic criteria for diabetes and prediabetes  States the risk factors for Type 2 Diabetes  States the symptoms of Type 2 Diabetes  Defines Type 2 Diabetes treatment goals  Defines Type 2 Diabetes treatment options  States the rationale for glucose monitoring  Identifies A1C, glucose targets, and testing times  Identifies proper sharps disposal  Defines the purpose of a diabetes food plan  Identifies carbohydrate food groups  Defines effects of carbohydrate foods on glucose levels  Identifies carbohydrate choices/grams/food labels  States benefits of physical activity and effect on glucose  Review of suggested activity guidelines  Handouts given during class include:  Type 2 Diabetes: Basics Book  My Food Plan Book  Food and Activity Log  Follow-Up Plan: Core Class 2

## 2012-03-27 ENCOUNTER — Ambulatory Visit: Payer: BC Managed Care – PPO

## 2012-03-28 NOTE — Patient Instructions (Addendum)
Since Protonix is not working we will arrange for you to see gastroenterologist. H. pylori has been added today to your lab work. May need to try Nexium or Dexilant instead of Protonix. Return in 6 months.

## 2012-05-25 ENCOUNTER — Other Ambulatory Visit: Payer: BC Managed Care – PPO | Admitting: Internal Medicine

## 2012-05-25 DIAGNOSIS — E119 Type 2 diabetes mellitus without complications: Secondary | ICD-10-CM

## 2012-05-25 LAB — HEMOGLOBIN A1C
Hgb A1c MFr Bld: 6.5 % — ABNORMAL HIGH (ref <5.7)
Mean Plasma Glucose: 140 mg/dL — ABNORMAL HIGH (ref <117)

## 2012-05-28 ENCOUNTER — Ambulatory Visit (INDEPENDENT_AMBULATORY_CARE_PROVIDER_SITE_OTHER): Payer: BC Managed Care – PPO | Admitting: Internal Medicine

## 2012-05-28 ENCOUNTER — Encounter: Payer: Self-pay | Admitting: Internal Medicine

## 2012-05-28 VITALS — BP 132/74 | HR 80 | Temp 97.7°F | Resp 12 | Wt 195.0 lb

## 2012-05-28 DIAGNOSIS — K219 Gastro-esophageal reflux disease without esophagitis: Secondary | ICD-10-CM

## 2012-05-28 DIAGNOSIS — R079 Chest pain, unspecified: Secondary | ICD-10-CM

## 2012-05-28 DIAGNOSIS — E119 Type 2 diabetes mellitus without complications: Secondary | ICD-10-CM

## 2012-05-28 DIAGNOSIS — F411 Generalized anxiety disorder: Secondary | ICD-10-CM

## 2012-05-28 DIAGNOSIS — E785 Hyperlipidemia, unspecified: Secondary | ICD-10-CM

## 2012-05-28 DIAGNOSIS — I10 Essential (primary) hypertension: Secondary | ICD-10-CM

## 2012-05-28 DIAGNOSIS — G4700 Insomnia, unspecified: Secondary | ICD-10-CM

## 2012-05-28 DIAGNOSIS — F4323 Adjustment disorder with mixed anxiety and depressed mood: Secondary | ICD-10-CM

## 2012-05-28 NOTE — Progress Notes (Signed)
Subjective:    Patient ID: Carla Oconnor, female    DOB: April 28, 1950, 62 y.o.   MRN: [id number]  HPI 62 year old black female with diabetes mellitus, hypertension, hyperlipidemia, asthma, fibrocystic breast disease, and GE reflux. She is overweight. Says she's been going to do the gym twice weekly. Despite this she has gained 4 pounds since November 2013. At last visit in November, I increased glipizide from 2.5-5 mg XL daily. Hemoglobin A1c has only gone down from 6.6-6.5%.  Today she has a new problem. For about 2 weeks she's had some left anterior chest pain radiating up into her neck and down her arm. No associated diaphoresis nausea or vomiting. Pain only bothers her at night when she's lying down. Sometimes she feels it awakens her from her sleep. She has long-standing history of GE reflux. She was on Protonix 40 mg twice daily and was having breakthrough heartburn. She saw Dr. Evette Cristal in late November who placed her on Dexilant. Nexium worked for her but her insurance would not pay for it. Has no water brash. Chest pain is not substernal. EKG today shows no acute changes.  Patient is on Diovan HCT 80/12.5 daily, Crestor 20 mg daily, Zyrtec,  clonazepam for anxiety and insomnia, potassium supplement, glipizide XL 5 mg daily.  She seems pretty convinced this new issue with chest pain is heart related.  She had a negative Cardiolite study in 2009. History cardiac catheterization by Dr. Aleen Campi in 2003. GYN physician is Dr. Hessie Diener rales. Has had hysterectomy right oophorectomy in 2003. Benign right breast biopsies in 1966 and 1976. C-sections 1975 and 1977. Right trigger finger release September 2009, right third trigger finger July 2007. Right Carpal tunnel release Fall 2013 by Dr. Teressa Senter.  Family history: Father died at age 53 with a stroke. Mother with history of diabetes and hypertension. One brother in fairly good health but has had some issues with crack. 4 sisters and 2 half sisters. One  sister died of kidney failure and breast cancer. One sister with history of diabetes and coronary artery disease.  Social history: Has one set of twins a boy and girl in their 30s. She also has another son.  Patient is married. Husband is an English as a second language teacher. 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 Has never smoked. No alcohol consumption.  Flu vaccine given 01/10/2012.Tdap given 01/10/2012. Prescription given to get Zostavax vaccine at pharmacy.      Review of Systems     Objective:   Physical Exam  Constitutional: She is oriented to person, place, and time. She appears well-developed and well-nourished.  Neck: Neck supple. No JVD present. No thyromegaly present.  Cardiovascular: Normal rate, regular rhythm and normal heart sounds.   No murmur heard. Pulmonary/Chest: Effort normal and breath sounds normal. No respiratory distress. She has no wheezes. She has no rales. She exhibits no tenderness.  Lymphadenopathy:    She has no cervical adenopathy.  Neurological: She is alert and oriented to person, place, and time.  Skin: Skin is warm and dry. She is not diaphoretic.          Assessment & Plan:  Atypical chest pain-symptoms only occur at night. Risk factors include hyperlipidemia and diabetes mellitus along with family history in one sister.  Hypertension-stable  Diabetes mellitus-and metformin 500 mg twice daily to glipizide XL 5 mg daily. Repeat hemoglobin A1c in 4 months  Hyperlipidemia-treated with Crestor-repeat lipid panel liver functions in 4 months  Anxiety  Insomnia  GE reflux-saw Dr. Evette Cristal November who tried her on Dexilant. Not clear if insurance will pay for that. Insurance will not pay for Nexium. Nexium worked well for her. Protonix 40 mg twice daily was not working well  Plan: Patient seems extremely worried about heart disease. Refer to cardiologist for further evaluation.

## 2012-05-28 NOTE — Patient Instructions (Addendum)
Appointment will be made for cardiology evaluation. Continue glipizide XL 5 mg daily. Add metformin 500 mg twice daily. Return in June for fasting lipid panel liver functions hemoglobin A1c

## 2012-05-30 ENCOUNTER — Telehealth: Payer: Self-pay

## 2012-05-30 DIAGNOSIS — R079 Chest pain, unspecified: Secondary | ICD-10-CM

## 2012-05-30 NOTE — Telephone Encounter (Signed)
Patient scheduled for an appointment with Dr. Eden Emms on 06/13/2012 at 3:45pm. Informed of this appointment

## 2012-06-13 ENCOUNTER — Encounter: Payer: Self-pay | Admitting: Cardiovascular Disease

## 2012-06-13 ENCOUNTER — Ambulatory Visit (INDEPENDENT_AMBULATORY_CARE_PROVIDER_SITE_OTHER): Payer: BC Managed Care – PPO | Admitting: Cardiovascular Disease

## 2012-06-13 VITALS — BP 144/85 | HR 71 | Ht 62.0 in | Wt 189.8 lb

## 2012-06-13 DIAGNOSIS — E119 Type 2 diabetes mellitus without complications: Secondary | ICD-10-CM

## 2012-06-13 DIAGNOSIS — K219 Gastro-esophageal reflux disease without esophagitis: Secondary | ICD-10-CM

## 2012-06-13 DIAGNOSIS — R079 Chest pain, unspecified: Secondary | ICD-10-CM

## 2012-06-13 DIAGNOSIS — E785 Hyperlipidemia, unspecified: Secondary | ICD-10-CM

## 2012-06-13 NOTE — Assessment & Plan Note (Signed)
Hopefully dexilant will get covered by insurance.  May be cause of her symptoms but different than past.  Elevate head of bed with blocks

## 2012-06-13 NOTE — Assessment & Plan Note (Signed)
Well controlled.  Continue current medications and low sodium Dash type diet.    

## 2012-06-13 NOTE — Assessment & Plan Note (Signed)
Atypical lots of risk factors including DM  F/U stress myovue

## 2012-06-13 NOTE — Progress Notes (Signed)
Patient ID: Carla Oconnor, female   DOB: [birth year], 62 y.o.   MRN: [id number] HPI 62 year old black female referred by Dr Lenord Fellers for chest pain  CRF;s  diabetes mellitus, hypertension, hyperlipidemia, Also has GE reflux    For about 2 weeks she's had some left anterior chest pain radiating up into her neck and down her arm. No associated diaphoresis nausea or vomiting. Pain only bothers her at night when she's lying down. Sometimes she feels it awakens her from her sleep. Pain is clearly non exertional She has long-standing history of GE reflux. She was on Protonix 40 mg twice daily and was having breakthrough heartburn. She saw Dr. Evette Cristal in late November who placed her on Dexilant. Nexium worked for her but her insurance would not pay for it. Has no water brash. Chest pain is not substernal. EKG no acute changes on Dr Beryle Quant office 05/28/12  She had a negative Cardiolite study in 2009. History cardiac catheterization by Dr. Aleen Campi in 2003 with no hemodynamically significant disease  Last LDL 9/13  97  ROS: Denies fever, malais, weight loss, blurry vision, decreased visual acuity, cough, sputum, SOB, hemoptysis, pleuritic pain, palpitaitons, heartburn, abdominal pain, melena, lower extremity edema, claudication, or rash.  All other systems reviewed and negative   General: Affect appropriate Healthy:  appears stated age HEENT: normal Neck supple with no adenopathy JVP normal no bruits no thyromegaly Lungs clear with no wheezing and good diaphragmatic motion Heart:  S1/S2 no murmur,rub, gallop or click PMI normal Abdomen: benighn, BS positve, no tenderness, no AAA no bruit.  No HSM or HJR Distal pulses intact with no bruits No edema Neuro non-focal Skin warm and dry No muscular weakness  Medications Current Outpatient Prescriptions  Medication Sig Dispense Refill  . albuterol (PROVENTIL HFA;VENTOLIN HFA) 108 (90 BASE) MCG/ACT inhaler Inhale 2 puffs into the lungs every 6 (six)  hours as needed.        Marland Kitchen aspirin 81 MG tablet Take 81 mg by mouth daily.      . Blood Glucose Monitoring Suppl (BAYER CONTOUR MONITOR) W/DEVICE KIT USE AS DIRECTED  1 each  0  . cetirizine (ZYRTEC) 10 MG tablet Take 10 mg by mouth daily.        . cholecalciferol (VITAMIN D) 1000 UNITS tablet Take 1,000 Units by mouth 2 (two) times daily.      . clonazePAM (KLONOPIN) 0.5 MG tablet Take 0.5 mg by mouth at bedtime as needed.      . CRESTOR 20 MG tablet TAKE ONE TABLET BY MOUTH EVERY DAY  30 each  0  . dexlansoprazole (DEXILANT) 60 MG capsule Take 60 mg by mouth daily.      Marland Kitchen DIOVAN HCT 80-12.5 MG per tablet TAKE ONE TABLET BY MOUTH EVERY DAY  30 each  11  . esomeprazole (NEXIUM) 40 MG capsule Take 40 mg by mouth daily before breakfast.      . glipiZIDE (GLUCOTROL XL) 5 MG 24 hr tablet       . Multiple Vitamin (MULTIVITAMIN) capsule Take 1 capsule by mouth daily.        . potassium chloride SA (K-DUR,KLOR-CON) 20 MEQ tablet Take 20 mEq by mouth 2 (two) times daily.        . traMADol (ULTRAM) 50 MG tablet 1 tab every 4 hours as needed for pain  20 tablet  0   No current facility-administered medications for this visit.    Allergies Percocet  Family History: Family History  Problem Relation  Age of Onset  . Stroke Father     Social History: History   Social History  . Marital Status: Married    Spouse Name: N/A    Number of Children: N/A  . Years of Education: N/A   Occupational History  . Not on file.   Social History Main Topics  . Smoking status: Never Smoker   . Smokeless tobacco: Never Used  . Alcohol Use: No  . Drug Use: No  . Sexually Active:    Other Topics Concern  . Not on file   Social History Narrative  . No narrative on file    Electrocardiogram:  NSR rate 67 nonspecific ST changes   Assessment and Plan

## 2012-06-13 NOTE — Assessment & Plan Note (Signed)
Discussed low carb diet.  Target hemoglobin A1c is 6.5 or less.  Continue current medications.  

## 2012-06-13 NOTE — Patient Instructions (Addendum)
Your physician recommends that you schedule a follow-up appointment in:  AS NEEDED  Your physician recommends that you continue on your current medications as directed. Please refer to the Current Medication list given to you today.  Your physician has requested that you have a lexiscan myoview. For further information please visit [email or web address]. Please follow instruction sheet, as given.  

## 2012-06-13 NOTE — Assessment & Plan Note (Signed)
Cholesterol is at goal.  Continue current dose of statin and diet Rx.  No myalgias or side effects.  F/U  LFT's in 6 months. Lab Results  Component Value Date   LDLCALC 97 01/09/2012

## 2012-06-24 ENCOUNTER — Other Ambulatory Visit: Payer: Self-pay | Admitting: Internal Medicine

## 2012-06-25 ENCOUNTER — Other Ambulatory Visit: Payer: Self-pay | Admitting: Internal Medicine

## 2012-06-28 ENCOUNTER — Ambulatory Visit (HOSPITAL_COMMUNITY): Payer: BC Managed Care – PPO | Attending: Cardiology | Admitting: Radiology

## 2012-06-28 VITALS — BP 130/79 | Ht 62.0 in | Wt 190.0 lb

## 2012-06-28 DIAGNOSIS — R0602 Shortness of breath: Secondary | ICD-10-CM

## 2012-06-28 DIAGNOSIS — R079 Chest pain, unspecified: Secondary | ICD-10-CM

## 2012-06-28 MED ORDER — REGADENOSON 0.4 MG/5ML IV SOLN
0.4000 mg | Freq: Once | INTRAVENOUS | Status: AC
  Administered 2012-06-28: 0.4 mg via INTRAVENOUS

## 2012-06-28 MED ORDER — TECHNETIUM TC 99M SESTAMIBI GENERIC - CARDIOLITE
11.0000 | Freq: Once | INTRAVENOUS | Status: AC | PRN
  Administered 2012-06-28: 11 via INTRAVENOUS

## 2012-06-28 MED ORDER — TECHNETIUM TC 99M SESTAMIBI GENERIC - CARDIOLITE
33.0000 | Freq: Once | INTRAVENOUS | Status: AC | PRN
  Administered 2012-06-28: 33 via INTRAVENOUS

## 2012-06-28 NOTE — Progress Notes (Signed)
[facility] SITE 3 NUCLEAR MED [address] [place], [place] [address] [phone]    Cardiology Nuclear Med Study  Carla Oconnor is a 62 y.o. female     MRN : [id number]     DOB: Jun 04, 1950  Procedure Date: 06/28/2012  Nuclear Med Background Indication for Stress Test:  Evaluation for Ischemia History:  2003 MPS- Normal, Heart Catheterization- Normal, EF 71% Cardiac Risk Factors: Family History - CAD, Hypertension, Lipids and NIDDM  Symptoms:  Chest Pain, DOE and Fatigue   Nuclear Pre-Procedure Caffeine/Decaff Intake:  None NPO After: 7:00pm   Lungs:  clear O2 Sat: 98% on room air. IV 0.9% NS with Angio Cath:  22g  IV Site: R Antecubital  IV Started by:  Bonnita Levan, RN  Chest Size (in):  36 Cup Size: C  Height: 5\' 2"  (1.575 m)  Weight:  190 lb (86.183 kg)  BMI:  Body mass index is 34.74 kg/(m^2). Tech Comments:  N/A    Nuclear Med Study 1 or 2 day study: 1 day  Stress Test Type:  Lexiscan  Reading MD: Marca Ancona, MD  Order Authorizing Provider:  Charlton Haws, MD  Resting Radionuclide: Technetium 47m Sestamibi  Resting Radionuclide Dose: 11.0 mCi   Stress Radionuclide:  Technetium 62m Sestamibi  Stress Radionuclide Dose: 33.0 mCi           Stress Protocol Rest HR: 65 Stress HR: 115  Rest BP: 130/79 Stress BP: 148/67  Exercise Time (min): 2:00 METS: 1.6   Predicted Max HR: 158 bpm % Max HR: 72.78 bpm Rate Pressure Product: 21308   Dose of Adenosine (mg):  n/a Dose of Lexiscan: 0.4 mg  Dose of Atropine (mg): n/a Dose of Dobutamine: n/a mcg/kg/min (at max HR)  Stress Test Technologist: Bonnita Levan, RN  Nuclear Technologist:  Domenic Polite, CNMT     Rest Procedure:  Myocardial perfusion imaging was performed at rest 45 minutes following the intravenous administration of Technetium 63m Sestamibi. Rest ECG: NSR - Normal EKG  Stress Procedure:  The patient received IV Lexiscan 0.4 mg over 15-seconds with concurrent low level exercise and then  Technetium 76m Sestamibi was injected at 30-seconds while the patient continued walking one more minute.  Quantitative spect images were obtained after a 45-minute delay. Stress ECG: Insignificant upsloping ST segment depression.  QPS Raw Data Images:  Normal; no motion artifact; normal heart/lung ratio. Stress Images:  Small, mild apical perfusion defect.  Rest Images:  Normal homogeneous uptake in all areas of the myocardium. Subtraction (SDS):  Reversible small mild apical perfusion defect.  Transient Ischemic Dilatation (Normal <1.22):  1.08 Lung/Heart Ratio (Normal <0.45):  0.28  Quantitative Gated Spect Images QGS EDV:  57 ml QGS ESV:  16 ml  Impression Exercise Capacity:  Lexiscan with low level exercise. BP Response:  Normal blood pressure response. Clinical Symptoms:  dyspnea ECG Impression:  Insignificant upsloping ST segment depression. Comparison with Prior Nuclear Study: No images to compare  Overall Impression:  Low risk stress nuclear study.  There is a small, mild reversible apical perfusion defect that could be due to a small area of ischemia.  However, there was prominent breast shadowing on planar images so shifting breast artifact is certainly possible.   LV Ejection Fraction: 72%.  LV Wall Motion:  NL LV Function; NL Wall Motion  Marca Ancona 06/28/2012    .

## 2012-07-06 ENCOUNTER — Other Ambulatory Visit: Payer: Self-pay | Admitting: Internal Medicine

## 2012-08-21 ENCOUNTER — Telehealth: Payer: Self-pay | Admitting: Internal Medicine

## 2012-08-21 NOTE — Telephone Encounter (Signed)
Having pain in shoulder, knee joints and body when trying to sleep at night.    Best number for Jeffie [phone]

## 2012-08-21 NOTE — Telephone Encounter (Signed)
Try 2 Extra Strength Tylenol hs and see if symptoms resolve over next few weeks. Just had cardiac evaluation. Doubt this is anything other than musculoskeletal pain.

## 2012-08-22 NOTE — Telephone Encounter (Signed)
Patient informed. She states she has some Celebrex samples Dr. Lenord Fellers gave her for something else, and has been taking an occasional one with relief. Does not want a rx for them. Will alternate Tylenol with Celebrex.

## 2012-08-28 ENCOUNTER — Other Ambulatory Visit: Payer: Self-pay | Admitting: Internal Medicine

## 2012-09-19 ENCOUNTER — Other Ambulatory Visit: Payer: Self-pay | Admitting: Internal Medicine

## 2012-09-24 ENCOUNTER — Other Ambulatory Visit: Payer: Self-pay

## 2012-09-24 ENCOUNTER — Other Ambulatory Visit: Payer: Self-pay | Admitting: Internal Medicine

## 2012-09-24 ENCOUNTER — Other Ambulatory Visit: Payer: BC Managed Care – PPO | Admitting: Internal Medicine

## 2012-09-24 DIAGNOSIS — IMO0001 Reserved for inherently not codable concepts without codable children: Secondary | ICD-10-CM

## 2012-09-24 DIAGNOSIS — E785 Hyperlipidemia, unspecified: Secondary | ICD-10-CM

## 2012-09-24 DIAGNOSIS — Z79899 Other long term (current) drug therapy: Secondary | ICD-10-CM

## 2012-09-24 DIAGNOSIS — E119 Type 2 diabetes mellitus without complications: Secondary | ICD-10-CM

## 2012-09-24 LAB — LIPID PANEL
Cholesterol: 246 mg/dL — ABNORMAL HIGH (ref 0–200)
HDL: 49 mg/dL (ref >39)
LDL Cholesterol: 173 mg/dL — ABNORMAL HIGH (ref 0–99)
Total CHOL/HDL Ratio: 5 Ratio
Triglycerides: 119 mg/dL (ref <150)
VLDL: 24 mg/dL (ref 0–40)

## 2012-09-24 LAB — HEPATIC FUNCTION PANEL
ALT: 19 U/L (ref 0–35)
AST: 26 U/L (ref 0–37)
Albumin: 4.1 g/dL (ref 3.5–5.2)
Alkaline Phosphatase: 57 U/L (ref 39–117)
Bilirubin, Direct: 0.1 mg/dL (ref 0.0–0.3)
Indirect Bilirubin: 0.4 mg/dL (ref 0.0–0.9)
Total Bilirubin: 0.5 mg/dL (ref 0.3–1.2)
Total Protein: 6.9 g/dL (ref 6.0–8.3)

## 2012-09-24 LAB — HEMOGLOBIN A1C
Hgb A1c MFr Bld: 6.4 % — ABNORMAL HIGH (ref <5.7)
Mean Plasma Glucose: 137 mg/dL — ABNORMAL HIGH (ref <117)

## 2012-09-25 ENCOUNTER — Ambulatory Visit (INDEPENDENT_AMBULATORY_CARE_PROVIDER_SITE_OTHER): Payer: BC Managed Care – PPO | Admitting: Internal Medicine

## 2012-09-25 ENCOUNTER — Encounter: Payer: Self-pay | Admitting: Internal Medicine

## 2012-09-25 VITALS — BP 120/70 | HR 76 | Temp 98.9°F | Wt 186.0 lb

## 2012-09-25 DIAGNOSIS — E785 Hyperlipidemia, unspecified: Secondary | ICD-10-CM

## 2012-09-25 DIAGNOSIS — I10 Essential (primary) hypertension: Secondary | ICD-10-CM

## 2012-09-25 DIAGNOSIS — E119 Type 2 diabetes mellitus without complications: Secondary | ICD-10-CM

## 2012-09-25 DIAGNOSIS — K219 Gastro-esophageal reflux disease without esophagitis: Secondary | ICD-10-CM

## 2012-10-17 ENCOUNTER — Other Ambulatory Visit: Payer: Self-pay | Admitting: Internal Medicine

## 2012-11-28 ENCOUNTER — Other Ambulatory Visit: Payer: Self-pay | Admitting: Internal Medicine

## 2012-11-29 ENCOUNTER — Other Ambulatory Visit: Payer: Self-pay

## 2012-11-29 MED ORDER — CLONAZEPAM 0.5 MG PO TABS: 0.5000 mg | ORAL_TABLET | Freq: Every evening | ORAL | Status: DC | PRN

## 2012-11-29 NOTE — Telephone Encounter (Signed)
Refill x 6 months 

## 2013-01-18 ENCOUNTER — Other Ambulatory Visit: Payer: BC Managed Care – PPO | Admitting: Internal Medicine

## 2013-01-18 DIAGNOSIS — I10 Essential (primary) hypertension: Secondary | ICD-10-CM

## 2013-01-18 DIAGNOSIS — E119 Type 2 diabetes mellitus without complications: Secondary | ICD-10-CM

## 2013-01-18 DIAGNOSIS — Z130 Encounter for screening for diseases of the blood and blood-forming organs and certain disorders involving the immune mechanism: Secondary | ICD-10-CM

## 2013-01-18 DIAGNOSIS — E785 Hyperlipidemia, unspecified: Secondary | ICD-10-CM

## 2013-01-18 DIAGNOSIS — Z13228 Encounter for screening for other metabolic disorders: Secondary | ICD-10-CM

## 2013-01-18 DIAGNOSIS — Z1329 Encounter for screening for other suspected endocrine disorder: Secondary | ICD-10-CM

## 2013-01-18 LAB — COMPREHENSIVE METABOLIC PANEL
ALT: 18 U/L (ref 0–35)
AST: 18 U/L (ref 0–37)
Albumin: 4.2 g/dL (ref 3.5–5.2)
Alkaline Phosphatase: 60 U/L (ref 39–117)
BUN: 19 mg/dL (ref 6–23)
CO2: 33 mEq/L — ABNORMAL HIGH (ref 19–32)
Calcium: 9.6 mg/dL (ref 8.4–10.5)
Chloride: 100 mEq/L (ref 96–112)
Creat: 1.01 mg/dL (ref 0.50–1.10)
Glucose, Bld: 107 mg/dL — ABNORMAL HIGH (ref 70–99)
Potassium: 3.8 mEq/L (ref 3.5–5.3)
Sodium: 138 mEq/L (ref 135–145)
Total Bilirubin: 0.5 mg/dL (ref 0.3–1.2)
Total Protein: 6.6 g/dL (ref 6.0–8.3)

## 2013-01-18 LAB — CBC WITH DIFFERENTIAL/PLATELET
Basophils Absolute: 0 10*3/uL (ref 0.0–0.1)
Basophils Relative: 1 % (ref 0–1)
Eosinophils Absolute: 0.1 10*3/uL (ref 0.0–0.7)
Eosinophils Relative: 2 % (ref 0–5)
HCT: 33.9 % — ABNORMAL LOW (ref 36.0–46.0)
Hemoglobin: 11.7 g/dL — ABNORMAL LOW (ref 12.0–15.0)
Lymphocytes Relative: 43 % (ref 12–46)
Lymphs Abs: 2.2 10*3/uL (ref 0.7–4.0)
MCH: 30.7 pg (ref 26.0–34.0)
MCHC: 34.5 g/dL (ref 30.0–36.0)
MCV: 89 fL (ref 78.0–100.0)
Monocytes Absolute: 0.4 10*3/uL (ref 0.1–1.0)
Monocytes Relative: 9 % (ref 3–12)
Neutro Abs: 2.3 10*3/uL (ref 1.7–7.7)
Neutrophils Relative %: 45 % (ref 43–77)
Platelets: 260 10*3/uL (ref 150–400)
RBC: 3.81 MIL/uL — ABNORMAL LOW (ref 3.87–5.11)
RDW: 14.3 % (ref 11.5–15.5)
WBC: 5.1 10*3/uL (ref 4.0–10.5)

## 2013-01-18 LAB — LIPID PANEL
Cholesterol: 162 mg/dL (ref 0–200)
HDL: 50 mg/dL (ref >39)
LDL Cholesterol: 80 mg/dL (ref 0–99)
Total CHOL/HDL Ratio: 3.2 Ratio
Triglycerides: 160 mg/dL — ABNORMAL HIGH (ref <150)
VLDL: 32 mg/dL (ref 0–40)

## 2013-01-18 LAB — TSH: TSH: 1.74 u[IU]/mL (ref 0.350–4.500)

## 2013-01-19 LAB — HEMOGLOBIN A1C
Hgb A1c MFr Bld: 6.7 % — ABNORMAL HIGH (ref <5.7)
Mean Plasma Glucose: 146 mg/dL — ABNORMAL HIGH (ref <117)

## 2013-01-19 LAB — VITAMIN D 25 HYDROXY (VIT D DEFICIENCY, FRACTURES): Vit D, 25-Hydroxy: 40 ng/mL (ref 30–89)

## 2013-01-21 ENCOUNTER — Other Ambulatory Visit: Payer: BC Managed Care – PPO | Admitting: Internal Medicine

## 2013-01-22 ENCOUNTER — Encounter: Payer: Self-pay | Admitting: Internal Medicine

## 2013-01-22 ENCOUNTER — Ambulatory Visit (INDEPENDENT_AMBULATORY_CARE_PROVIDER_SITE_OTHER): Payer: BC Managed Care – PPO | Admitting: Internal Medicine

## 2013-01-22 ENCOUNTER — Other Ambulatory Visit (HOSPITAL_COMMUNITY)
Admission: RE | Admit: 2013-01-22 | Discharge: 2013-01-22 | Disposition: A | Discharge status: Home / Self Care | Payer: BC Managed Care – PPO | Source: Ambulatory Visit | Attending: Internal Medicine | Admitting: Internal Medicine

## 2013-01-22 VITALS — BP 120/66 | HR 84 | Temp 98.3°F | Ht 62.0 in | Wt 186.0 lb

## 2013-01-22 DIAGNOSIS — I10 Essential (primary) hypertension: Secondary | ICD-10-CM

## 2013-01-22 DIAGNOSIS — E785 Hyperlipidemia, unspecified: Secondary | ICD-10-CM

## 2013-01-22 DIAGNOSIS — K219 Gastro-esophageal reflux disease without esophagitis: Secondary | ICD-10-CM

## 2013-01-22 DIAGNOSIS — Z01419 Encounter for gynecological examination (general) (routine) without abnormal findings: Secondary | ICD-10-CM | POA: Insufficient documentation

## 2013-01-22 DIAGNOSIS — E119 Type 2 diabetes mellitus without complications: Secondary | ICD-10-CM

## 2013-01-22 DIAGNOSIS — R82998 Other abnormal findings in urine: Secondary | ICD-10-CM

## 2013-01-22 DIAGNOSIS — Z0000 Encounter for general adult medical examination without abnormal findings: Secondary | ICD-10-CM

## 2013-01-22 DIAGNOSIS — J309 Allergic rhinitis, unspecified: Secondary | ICD-10-CM

## 2013-01-22 MED ORDER — CLONAZEPAM 0.5 MG PO TABS: 0.5000 mg | ORAL_TABLET | Freq: Every evening | ORAL | Status: DC | PRN

## 2013-01-22 MED ORDER — GLIPIZIDE ER 5 MG PO TB24: 5.0000 mg | ORAL_TABLET | Freq: Every day | ORAL | Status: DC

## 2013-01-22 NOTE — Patient Instructions (Addendum)
Order given for Zostavax to be obtained at pharmacy. Return in 6 months. Continue same meds.  Addendum: Urine culture : no growth

## 2013-01-22 NOTE — Progress Notes (Signed)
Subjective:    Patient ID: Carla Oconnor, female    DOB: 1951-02-27, 62 y.o.   MRN: [id number]  HPI  62 year old Black female for health maintenance and evaluation of medical problems. History of diabetes mellitus type 2, GE reflux, asthma, allergic rhinitis, fibrocystic breast disease. Says she has been unable to tolerate Prevacid in the past because of hives. In 2013 was having a lot of GE reflux symptoms despite double dose Protonix. She was sent back to [facility] GI physician for evaluation.Dr. Evette Cristal gave her samples of Dexilant.  Past medical history: Hysterectomy with right oophorectomy in 2003. Dr. Duane Lope is GYN physician. History of left lateral epicondylitis 2013. Carpal tunnel release right hand October 2013 by Dr. Teressa Senter. C-sections 1975 and 1977. Benign right breast biopsy done in 1966 and also 1976. Right trigger finger release September 2009. Right third trigger finger treated July 2007.  Social history: She has one set of twins a boy and a girl in their 30s and also another son. She is married. Husband is a English as a second language teacher. 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 Has never smoked. No alcohol consumption.  Family history: Father died at age 86 with a stroke. Mother with history of diabetes and hypertension. One brother in fairly good health who has had some trouble with crack addiction. Total of 4 sisters. She has two whole sisters and 2 half sisters. One sister died of kidney failure and breast cancer. One sister with history of diabetes and coronary artery disease.  Drug intolerances: Prevacid causes hives. Patient says ibuprofen causes palpitations.  Spring of 2014 was seen by Dr. Winn Jock regarding chest pain. Had Cardiolite study which was negative. She had a previous Cardiolite study in 2009 that was negative. History of cardiac catheterization by Dr. Aleen Campi in 2003.      Review of Systems  HENT: Positive for congestion and sneezing.         Right ear itching.  Eyes:       Wears glasses. Dr. Nile Riggs did exam Sept 8, 2014  Endocrine:       History of DM  Genitourinary: Negative.   Allergic/Immunologic: Positive for environmental allergies.  Hematological: Negative.   Psychiatric/Behavioral:       Anxiety       Objective:   Physical Exam  Vitals reviewed. Constitutional: She is oriented to person, place, and time. She appears well-developed and well-nourished. No distress.  HENT:  Head: Normocephalic and atraumatic.  Right Ear: External ear normal.  Left Ear: External ear normal.  Mouth/Throat: Oropharynx is clear and moist. No oropharyngeal exudate.  Eyes: Conjunctivae and EOM are normal. Pupils are equal, round, and reactive to light. Right eye exhibits no discharge. Left eye exhibits no discharge. No scleral icterus.  Neck: Neck supple. No JVD present. No thyromegaly present.  Cardiovascular: Normal rate, regular rhythm, normal heart sounds and intact distal pulses.   No murmur heard. Pulmonary/Chest: Effort normal and breath sounds normal. No respiratory distress. She has no wheezes. She has no rales. She exhibits no tenderness.  Abdominal: Bowel sounds are normal. She exhibits no distension and no mass. There is no tenderness. There is no rebound and no guarding.  Genitourinary: Vagina normal.  Pap taken of vaginal cuff. No masses on bimanual exam. Patient is status post hysterectomy and right oophorectomy  Musculoskeletal: Normal range of motion. She exhibits no edema.  Lymphadenopathy:    She has no cervical adenopathy.  Neurological: She is  alert and oriented to person, place, and time. She has normal reflexes. No cranial nerve deficit. Coordination normal.  Skin: Skin is warm and dry. No rash noted. She is not diaphoretic.  Psychiatric: She has a normal mood and affect. Her behavior is normal. Judgment and thought content normal.  Seems anxious and worries a lot about her health           Assessment & Plan:  Hypertension-stable on medication  GE reflux-stable on PPI  Type 2 diabetes mellitus-controlled  hemoglobin A1c 6.7%  History of hyperlipidemia  History of allergic rhinitis  History of asthma  Plan: Continue same medications return in 6 months. Order given for Zostavax vaccine. Reminded regarding annual mammogram. Recommend annual flu vaccine.

## 2013-01-23 ENCOUNTER — Other Ambulatory Visit: Payer: Self-pay | Admitting: Internal Medicine

## 2013-01-24 LAB — URINE CULTURE
Colony Count: NO GROWTH
Organism ID, Bacteria: NO GROWTH

## 2013-03-07 ENCOUNTER — Other Ambulatory Visit: Payer: Self-pay | Admitting: Internal Medicine

## 2013-03-17 NOTE — Progress Notes (Signed)
   Subjective:    Patient ID: Carla Oconnor, female    DOB: 27-Apr-1950, 62 y.o.   MRN: [id number]  HPI 62 year old Black female with history of diabetes mellitus, hypertension, hyperlipidemia anxiety, GE reflux, insomnia. She's here today for followup of diabetes mellitus. Is home Accu-Chek machine. Is on statin medication consisting of Crestor. Is on Diovan HCT for hypertension. Takes Klonopin for anxiety. At last visit in February, she was having issues with chest pain and had cardiology evaluation by Dr. Eden Emms who thought she had GE reflux as cause of her chest pain. She is on glipizide and metformin for diabetes.     Review of Systems     Objective:   Physical Exam Skin warm and dry. Nodes none. Chest clear to auscultation. Cardiac exam regular rate and rhythm normal S1 and S2. No JVD thyromegaly or carotid bruits. Extremities without edema.  Liver functions are normal. Total cholesterol is 246 with an LDL cholesterol of 173. She is supposed to be taking Crestor but I think has not been taking it. Hemoglobin A1c is 6.4% and stable on medication.        Assessment & Plan:  Controlled type 2 diabetes mellitus  Hyperlipidemia  Anxiety  GE reflux  Hypertension-stable  Plan: Make sure she is taking Crestor. Return in 4 months for physical exam.

## 2013-03-17 NOTE — Patient Instructions (Signed)
Please be compliant with Crestor. Continue other medications and return in 4 months for physical exam and lab work

## 2013-03-20 ENCOUNTER — Other Ambulatory Visit: Payer: Self-pay | Admitting: Internal Medicine

## 2013-03-22 ENCOUNTER — Other Ambulatory Visit: Payer: Self-pay | Admitting: Internal Medicine

## 2013-04-01 ENCOUNTER — Encounter: Payer: Self-pay | Admitting: Internal Medicine

## 2013-04-01 ENCOUNTER — Ambulatory Visit (INDEPENDENT_AMBULATORY_CARE_PROVIDER_SITE_OTHER): Payer: BC Managed Care – PPO | Admitting: Internal Medicine

## 2013-04-01 VITALS — BP 152/94 | HR 76 | Temp 98.1°F | Ht 62.0 in | Wt 189.0 lb

## 2013-04-01 DIAGNOSIS — M7062 Trochanteric bursitis, left hip: Secondary | ICD-10-CM

## 2013-04-01 DIAGNOSIS — M76899 Other specified enthesopathies of unspecified lower limb, excluding foot: Secondary | ICD-10-CM

## 2013-04-01 MED ORDER — METHYLPREDNISOLONE ACETATE 80 MG/ML IJ SUSP
80.0000 mg | Freq: Once | INTRAMUSCULAR | Status: AC
  Administered 2013-04-01: 80 mg via INTRAMUSCULAR

## 2013-04-01 NOTE — Progress Notes (Signed)
   Subjective:    Patient ID: Carla Oconnor, female    DOB: [birth year], 62 y.o.   MRN: [id number]  HPI Patient complaining of pain in left hip since late October. Denies any injury. Seems to be aggravated by prolonged standing. She tried extended release indomethacin 75 mg daily. She had green and white tablets made by [employer] that worked well for her. She was given a new generic recently which she says broke her hands out and did not work as well. This was obtained at [facility] pharmacy. No numbness or tingling in left lower extremity. No significant back pain.    Review of Systems     Objective:   Physical Exam straight leg raising at 90 is negative. Deep tendon reflexes in the left lower extremity are normal as is muscle strength. Patient has pain over left greater trochanter. No significant back pain to palpation. Some pain with external rotation left hip.        Assessment & Plan:  Left trochanteric bursitis  Plan: See if [facility] will  obtain generic from [employer] for you for extended release indomethacin 75 mg daily. Injected left bursa with Depo-Medrol, Marcaine and Xylocaine today. Ice left hip and trochanter 20 minutes daily. Refer to orthopedics if symptoms do not improve in the next 2-4 weeks.

## 2013-04-01 NOTE — Patient Instructions (Signed)
Try to exchange indomethacin extended release 75 mg daily made by a different company for one made by [employer] that she had previously. Ice left trochanter 20 minutes daily. If symptoms do not improve, to call [facility] orthopedics [phone] for further evaluation.

## 2013-06-06 ENCOUNTER — Encounter: Payer: Self-pay | Admitting: Internal Medicine

## 2013-06-06 ENCOUNTER — Ambulatory Visit (INDEPENDENT_AMBULATORY_CARE_PROVIDER_SITE_OTHER): Payer: BC Managed Care – PPO | Admitting: Internal Medicine

## 2013-06-06 VITALS — BP 140/78 | HR 68 | Temp 97.8°F | Wt 192.0 lb

## 2013-06-06 DIAGNOSIS — R0602 Shortness of breath: Secondary | ICD-10-CM

## 2013-06-06 DIAGNOSIS — R059 Cough, unspecified: Secondary | ICD-10-CM

## 2013-06-06 DIAGNOSIS — R05 Cough: Secondary | ICD-10-CM

## 2013-06-06 DIAGNOSIS — R51 Headache: Secondary | ICD-10-CM

## 2013-06-06 DIAGNOSIS — J069 Acute upper respiratory infection, unspecified: Secondary | ICD-10-CM

## 2013-06-06 MED ORDER — AZITHROMYCIN 250 MG PO TABS: ORAL_TABLET | ORAL | Status: DC

## 2013-06-06 MED ORDER — HYDROCOD POLST-CHLORPHEN POLST 10-8 MG/5ML PO LQCR: 5.0000 mL | Freq: Two times a day (BID) | ORAL | Status: DC | PRN

## 2013-06-06 NOTE — Patient Instructions (Signed)
Take Z-pak as prescribed.  Take Tussionex sparingly for cough.

## 2013-06-06 NOTE — Progress Notes (Signed)
   Subjective:    Patient ID: Carla BarthelBarbara I Oconnor, female    DOB: [birth year], 63 y.o.   MRN: [id number]  HPI 2 day history of URI symptoms. Has had some discolored sputum. No significant sore throat. Mainly just cough runny nose and congestion. No fever or shaking chills.    Review of Systems     Objective:   Physical Exam Pharynx slightly injected. TMs are slightly full bilaterally but not red. Neck is supple without adenopathy. Chest clear to auscultation.       Assessment & Plan:  Acute URI  Plan: Zithromax Z-Pak take as directed. At her request, Tussionex suspension 1 teaspoon by mouth every 12 hours when necessary cough.

## 2013-07-22 ENCOUNTER — Other Ambulatory Visit: Payer: BC Managed Care – PPO | Admitting: Internal Medicine

## 2013-07-22 DIAGNOSIS — Z79899 Other long term (current) drug therapy: Secondary | ICD-10-CM

## 2013-07-22 DIAGNOSIS — E119 Type 2 diabetes mellitus without complications: Secondary | ICD-10-CM

## 2013-07-22 DIAGNOSIS — E785 Hyperlipidemia, unspecified: Secondary | ICD-10-CM

## 2013-07-22 LAB — LIPID PANEL
Cholesterol: 211 mg/dL — ABNORMAL HIGH (ref 0–200)
HDL: 51 mg/dL (ref >39)
LDL Cholesterol: 134 mg/dL — ABNORMAL HIGH (ref 0–99)
Total CHOL/HDL Ratio: 4.1 Ratio
Triglycerides: 132 mg/dL (ref <150)
VLDL: 26 mg/dL (ref 0–40)

## 2013-07-22 LAB — HEMOGLOBIN A1C
Hgb A1c MFr Bld: 6.7 % — ABNORMAL HIGH (ref <5.7)
Mean Plasma Glucose: 146 mg/dL — ABNORMAL HIGH (ref <117)

## 2013-07-22 LAB — HEPATIC FUNCTION PANEL
ALT: 13 U/L (ref 0–35)
AST: 16 U/L (ref 0–37)
Albumin: 4.2 g/dL (ref 3.5–5.2)
Alkaline Phosphatase: 53 U/L (ref 39–117)
Bilirubin, Direct: 0.1 mg/dL (ref 0.0–0.3)
Indirect Bilirubin: 0.6 mg/dL (ref 0.2–1.2)
Total Bilirubin: 0.7 mg/dL (ref 0.2–1.2)
Total Protein: 6.4 g/dL (ref 6.0–8.3)

## 2013-07-23 ENCOUNTER — Ambulatory Visit (INDEPENDENT_AMBULATORY_CARE_PROVIDER_SITE_OTHER): Payer: BC Managed Care – PPO | Admitting: Internal Medicine

## 2013-07-23 ENCOUNTER — Encounter: Payer: Self-pay | Admitting: Internal Medicine

## 2013-07-23 VITALS — BP 138/72 | HR 76 | Wt 192.0 lb

## 2013-07-23 DIAGNOSIS — E8881 Metabolic syndrome: Secondary | ICD-10-CM

## 2013-07-23 MED ORDER — CYCLOBENZAPRINE HCL 10 MG PO TABS: 10.0000 mg | ORAL_TABLET | Freq: Every day | ORAL | Status: DC

## 2013-08-17 ENCOUNTER — Encounter: Payer: Self-pay | Admitting: Internal Medicine

## 2013-08-17 DIAGNOSIS — E8881 Metabolic syndrome: Secondary | ICD-10-CM | POA: Insufficient documentation

## 2013-08-17 NOTE — Patient Instructions (Signed)
Please try to diet and exercise. Continue same medications and return in 6 months

## 2013-08-17 NOTE — Progress Notes (Signed)
   Subjective:    Patient ID: Carla Oconnor, female    DOB: [birth year], 63 y.o.   MRN: [id number]  HPI  In today for six-month recheck. History of hypertension, hyperlipidemia, controlled type 2 diabetes mellitus, GE reflux, anxiety. Triglycerides have improved to within normal limits but total cholesterol is 211 and previously was 162. and LDL cholesterol is elevated at 134 and previously was 80. Hemoglobin A1c stable at 6.7%. Liver functions are normal. No new complaints today. She does tend to worry about her health a great deal. Needs to diet and exercise. Blood pressure controlled.    Review of Systems     Objective:   Physical Exam Skin warm and dry. Nodes none. Neck is supple without JVD thyromegaly or carotid bruits. Chest clear to auscultation. Cardiac exam regular rate and rhythm normal S1 and S2. Extremities without edema.        Assessment & Plan:  Essential hypertension-stable  Hyperlipidemia-stable but could be a bit improved with diet and exercise  Controlled type 2 diabetes mellitus  Anxiety  GE reflux  Plan: Encouraged diet, exercise and weight loss and return in 6 months for physical examination. For now, continue same medications

## 2013-08-27 ENCOUNTER — Other Ambulatory Visit: Payer: Self-pay | Admitting: Internal Medicine

## 2013-10-02 ENCOUNTER — Other Ambulatory Visit: Payer: Self-pay

## 2013-10-02 MED ORDER — VALSARTAN-HYDROCHLOROTHIAZIDE 80-12.5 MG PO TABS: 1.0000 | ORAL_TABLET | Freq: Every day | ORAL | Status: DC

## 2013-10-03 ENCOUNTER — Encounter: Payer: Self-pay | Admitting: Internal Medicine

## 2013-10-03 ENCOUNTER — Ambulatory Visit (INDEPENDENT_AMBULATORY_CARE_PROVIDER_SITE_OTHER): Payer: BC Managed Care – PPO | Admitting: Internal Medicine

## 2013-10-03 VITALS — BP 144/82 | HR 84 | Temp 98.2°F | Wt 192.0 lb

## 2013-10-03 DIAGNOSIS — M5441 Lumbago with sciatica, right side: Secondary | ICD-10-CM

## 2013-10-03 DIAGNOSIS — M5430 Sciatica, unspecified side: Secondary | ICD-10-CM

## 2013-10-03 MED ORDER — PREDNISONE 10 MG PO KIT: PACK | ORAL | Status: DC

## 2013-10-03 MED ORDER — HYDROCODONE-ACETAMINOPHEN 5-325 MG PO TABS: 1.0000 | ORAL_TABLET | Freq: Two times a day (BID) | ORAL | Status: DC

## 2013-10-08 ENCOUNTER — Ambulatory Visit
Admission: RE | Admit: 2013-10-08 | Discharge: 2013-10-08 | Disposition: A | Discharge status: Home / Self Care | Payer: BC Managed Care – PPO | Source: Ambulatory Visit | Attending: Internal Medicine | Admitting: Internal Medicine

## 2013-10-08 DIAGNOSIS — M5442 Lumbago with sciatica, left side: Secondary | ICD-10-CM

## 2013-10-15 ENCOUNTER — Other Ambulatory Visit: Payer: Self-pay | Admitting: Internal Medicine

## 2013-10-25 ENCOUNTER — Telehealth: Payer: Self-pay

## 2013-10-25 ENCOUNTER — Encounter (HOSPITAL_BASED_OUTPATIENT_CLINIC_OR_DEPARTMENT_OTHER): Payer: Self-pay | Admitting: Emergency Medicine

## 2013-10-25 ENCOUNTER — Emergency Department (HOSPITAL_BASED_OUTPATIENT_CLINIC_OR_DEPARTMENT_OTHER): Payer: BC Managed Care – PPO

## 2013-10-25 ENCOUNTER — Inpatient Hospital Stay (HOSPITAL_BASED_OUTPATIENT_CLINIC_OR_DEPARTMENT_OTHER)
Admission: EM | Admit: 2013-10-25 | Discharge: 2013-10-27 | DRG: 638 | Disposition: A | Discharge status: Home / Self Care | Payer: BC Managed Care – PPO | Attending: Internal Medicine | Admitting: Internal Medicine

## 2013-10-25 DIAGNOSIS — I10 Essential (primary) hypertension: Secondary | ICD-10-CM

## 2013-10-25 DIAGNOSIS — M549 Dorsalgia, unspecified: Secondary | ICD-10-CM

## 2013-10-25 DIAGNOSIS — Z7982 Long term (current) use of aspirin: Secondary | ICD-10-CM

## 2013-10-25 DIAGNOSIS — J45909 Unspecified asthma, uncomplicated: Secondary | ICD-10-CM | POA: Diagnosis present

## 2013-10-25 DIAGNOSIS — E860 Dehydration: Secondary | ICD-10-CM

## 2013-10-25 DIAGNOSIS — I129 Hypertensive chronic kidney disease with stage 1 through stage 4 chronic kidney disease, or unspecified chronic kidney disease: Secondary | ICD-10-CM | POA: Diagnosis present

## 2013-10-25 DIAGNOSIS — E1100 Type 2 diabetes mellitus with hyperosmolarity without nonketotic hyperglycemic-hyperosmolar coma (NKHHC): Principal | ICD-10-CM | POA: Diagnosis present

## 2013-10-25 DIAGNOSIS — N189 Chronic kidney disease, unspecified: Secondary | ICD-10-CM

## 2013-10-25 DIAGNOSIS — R7309 Other abnormal glucose: Secondary | ICD-10-CM

## 2013-10-25 DIAGNOSIS — T380X5A Adverse effect of glucocorticoids and synthetic analogues, initial encounter: Secondary | ICD-10-CM | POA: Diagnosis present

## 2013-10-25 DIAGNOSIS — E8881 Metabolic syndrome: Secondary | ICD-10-CM

## 2013-10-25 DIAGNOSIS — Z79899 Other long term (current) drug therapy: Secondary | ICD-10-CM

## 2013-10-25 DIAGNOSIS — F411 Generalized anxiety disorder: Secondary | ICD-10-CM

## 2013-10-25 DIAGNOSIS — E1165 Type 2 diabetes mellitus with hyperglycemia: Secondary | ICD-10-CM

## 2013-10-25 DIAGNOSIS — R739 Hyperglycemia, unspecified: Secondary | ICD-10-CM

## 2013-10-25 DIAGNOSIS — G4700 Insomnia, unspecified: Secondary | ICD-10-CM

## 2013-10-25 DIAGNOSIS — E785 Hyperlipidemia, unspecified: Secondary | ICD-10-CM

## 2013-10-25 DIAGNOSIS — G8929 Other chronic pain: Secondary | ICD-10-CM

## 2013-10-25 DIAGNOSIS — K219 Gastro-esophageal reflux disease without esophagitis: Secondary | ICD-10-CM

## 2013-10-25 DIAGNOSIS — N179 Acute kidney failure, unspecified: Secondary | ICD-10-CM

## 2013-10-25 DIAGNOSIS — N182 Chronic kidney disease, stage 2 (mild): Secondary | ICD-10-CM | POA: Diagnosis present

## 2013-10-25 HISTORY — DX: Type 2 diabetes mellitus without complications: E11.9

## 2013-10-25 LAB — I-STAT VENOUS BLOOD GAS, ED
Acid-Base Excess: 2 mmol/L (ref 0.0–2.0)
Bicarbonate: 29.3 mEq/L — ABNORMAL HIGH (ref 20.0–24.0)
O2 Saturation: 50 %
Patient temperature: 98.1
TCO2: 31 mmol/L (ref 0–100)
pCO2, Ven: 56.4 mmHg — ABNORMAL HIGH (ref 45.0–50.0)
pH, Ven: 7.323 — ABNORMAL HIGH (ref 7.250–7.300)
pO2, Ven: 29 mmHg — CL (ref 30.0–45.0)

## 2013-10-25 LAB — BASIC METABOLIC PANEL
Anion gap: 16 — ABNORMAL HIGH (ref 5–15)
Anion gap: 14 (ref 5–15)
BUN: 23 mg/dL (ref 6–23)
BUN: 17 mg/dL (ref 6–23)
CO2: 30 mEq/L (ref 19–32)
CO2: 26 mEq/L (ref 19–32)
Calcium: 10.6 mg/dL — ABNORMAL HIGH (ref 8.4–10.5)
Calcium: 9.8 mg/dL (ref 8.4–10.5)
Chloride: 91 mEq/L — ABNORMAL LOW (ref 96–112)
Chloride: 102 mEq/L (ref 96–112)
Creatinine, Ser: 1.2 mg/dL — ABNORMAL HIGH (ref 0.50–1.10)
Creatinine, Ser: 0.87 mg/dL (ref 0.50–1.10)
GFR calc Af Amer: 55 mL/min — ABNORMAL LOW (ref >90)
GFR calc Af Amer: 80 mL/min — ABNORMAL LOW (ref >90)
GFR calc non Af Amer: 47 mL/min — ABNORMAL LOW (ref >90)
GFR calc non Af Amer: 69 mL/min — ABNORMAL LOW (ref >90)
Glucose, Bld: 703 mg/dL (ref 70–99)
Glucose, Bld: 223 mg/dL — ABNORMAL HIGH (ref 70–99)
Potassium: 4.2 mEq/L (ref 3.7–5.3)
Potassium: 3.9 mEq/L (ref 3.7–5.3)
Sodium: 137 mEq/L (ref 137–147)
Sodium: 142 mEq/L (ref 137–147)

## 2013-10-25 LAB — URINALYSIS, ROUTINE W REFLEX MICROSCOPIC
Bilirubin Urine: NEGATIVE
Glucose, UA: >1000 mg/dL — AB
Hgb urine dipstick: NEGATIVE
Ketones, ur: 15 mg/dL — AB
Leukocytes, UA: NEGATIVE
Nitrite: NEGATIVE
Protein, ur: NEGATIVE mg/dL
Specific Gravity, Urine: 1.034 — ABNORMAL HIGH (ref 1.005–1.030)
Urobilinogen, UA: 1 mg/dL (ref 0.0–1.0)
pH: 5.5 (ref 5.0–8.0)

## 2013-10-25 LAB — HEMOGLOBIN A1C
Hgb A1c MFr Bld: 9.4 % — ABNORMAL HIGH (ref <5.7)
Mean Plasma Glucose: 223 mg/dL — ABNORMAL HIGH (ref <117)

## 2013-10-25 LAB — CBC WITH DIFFERENTIAL/PLATELET
Basophils Absolute: 0.1 10*3/uL (ref 0.0–0.1)
Basophils Relative: 1 % (ref 0–1)
Eosinophils Absolute: 0 10*3/uL (ref 0.0–0.7)
Eosinophils Relative: 1 % (ref 0–5)
HCT: 38.3 % (ref 36.0–46.0)
Hemoglobin: 13.1 g/dL (ref 12.0–15.0)
Lymphocytes Relative: 33 % (ref 12–46)
Lymphs Abs: 1.7 10*3/uL (ref 0.7–4.0)
MCH: 30.5 pg (ref 26.0–34.0)
MCHC: 34.2 g/dL (ref 30.0–36.0)
MCV: 89.1 fL (ref 78.0–100.0)
Monocytes Absolute: 0.6 10*3/uL (ref 0.1–1.0)
Monocytes Relative: 11 % (ref 3–12)
Neutro Abs: 2.8 10*3/uL (ref 1.7–7.7)
Neutrophils Relative %: 55 % (ref 43–77)
Platelets: 193 10*3/uL (ref 150–400)
RBC: 4.3 MIL/uL (ref 3.87–5.11)
RDW: 12.1 % (ref 11.5–15.5)
WBC: 5.1 10*3/uL (ref 4.0–10.5)

## 2013-10-25 LAB — GLUCOSE, CAPILLARY
Glucose-Capillary: 179 mg/dL — ABNORMAL HIGH (ref 70–99)
Glucose-Capillary: 186 mg/dL — ABNORMAL HIGH (ref 70–99)
Glucose-Capillary: 191 mg/dL — ABNORMAL HIGH (ref 70–99)
Glucose-Capillary: 211 mg/dL — ABNORMAL HIGH (ref 70–99)
Glucose-Capillary: 251 mg/dL — ABNORMAL HIGH (ref 70–99)
Glucose-Capillary: 297 mg/dL — ABNORMAL HIGH (ref 70–99)
Glucose-Capillary: 329 mg/dL — ABNORMAL HIGH (ref 70–99)

## 2013-10-25 LAB — CBG MONITORING, ED
Glucose-Capillary: 566 mg/dL (ref 70–99)
Glucose-Capillary: 490 mg/dL — ABNORMAL HIGH (ref 70–99)
Glucose-Capillary: 372 mg/dL — ABNORMAL HIGH (ref 70–99)
Glucose-Capillary: 272 mg/dL — ABNORMAL HIGH (ref 70–99)

## 2013-10-25 LAB — CBC
HCT: 35.6 % — ABNORMAL LOW (ref 36.0–46.0)
Hemoglobin: 12.3 g/dL (ref 12.0–15.0)
MCH: 29.9 pg (ref 26.0–34.0)
MCHC: 34.6 g/dL (ref 30.0–36.0)
MCV: 86.4 fL (ref 78.0–100.0)
Platelets: 173 10*3/uL (ref 150–400)
RBC: 4.12 MIL/uL (ref 3.87–5.11)
RDW: 12.5 % (ref 11.5–15.5)
WBC: 5.3 10*3/uL (ref 4.0–10.5)

## 2013-10-25 LAB — PHOSPHORUS: Phosphorus: 2.5 mg/dL (ref 2.3–4.6)

## 2013-10-25 LAB — URINE MICROSCOPIC-ADD ON

## 2013-10-25 LAB — CK TOTAL AND CKMB (NOT AT ARMC)
CK, MB: 3.8 ng/mL (ref 0.3–4.0)
Relative Index: 0.3 (ref 0.0–2.5)
Total CK: 1266 U/L — ABNORMAL HIGH (ref 7–177)

## 2013-10-25 LAB — MRSA PCR SCREENING: MRSA by PCR: POSITIVE — AB

## 2013-10-25 LAB — MAGNESIUM: Magnesium: 2.3 mg/dL (ref 1.5–2.5)

## 2013-10-25 LAB — TSH: TSH: 0.657 u[IU]/mL (ref 0.350–4.500)

## 2013-10-25 LAB — TROPONIN I: Troponin I: <0.3 ng/mL (ref <0.30)

## 2013-10-25 MED ORDER — PANTOPRAZOLE SODIUM 40 MG PO TBEC
40.0000 mg | DELAYED_RELEASE_TABLET | Freq: Every day | ORAL | Status: DC
  Administered 2013-10-26 – 2013-10-27 (×2): 40 mg via ORAL
  Filled 2013-10-25 (×2): qty 1

## 2013-10-25 MED ORDER — PANTOPRAZOLE SODIUM 40 MG PO TBEC: 40.0000 mg | DELAYED_RELEASE_TABLET | Freq: Every day | ORAL | Status: DC

## 2013-10-25 MED ORDER — ASPIRIN EC 81 MG PO TBEC
81.0000 mg | DELAYED_RELEASE_TABLET | Freq: Every day | ORAL | Status: DC
  Administered 2013-10-26 – 2013-10-27 (×2): 81 mg via ORAL
  Filled 2013-10-25 (×2): qty 1

## 2013-10-25 MED ORDER — ONDANSETRON HCL 4 MG/2ML IJ SOLN
4.0000 mg | Freq: Four times a day (QID) | INTRAMUSCULAR | Status: DC | PRN
Start: 2013-10-25 — End: 2013-10-27

## 2013-10-25 MED ORDER — SODIUM CHLORIDE 0.9 % IV BOLUS (SEPSIS)
1000.0000 mL | Freq: Once | INTRAVENOUS | Status: AC
  Administered 2013-10-25: 1000 mL via INTRAVENOUS

## 2013-10-25 MED ORDER — INSULIN ASPART 100 UNIT/ML ~~LOC~~ SOLN: 0.0000 [IU] | SUBCUTANEOUS | Status: DC

## 2013-10-25 MED ORDER — SODIUM CHLORIDE 0.9 % IV SOLN
INTRAVENOUS | Status: DC
  Administered 2013-10-25: 1.3 [IU]/h via INTRAVENOUS
  Filled 2013-10-25: qty 1

## 2013-10-25 MED ORDER — ISOSORB DINITRATE-HYDRALAZINE 20-37.5 MG PO TABS
1.0000 | ORAL_TABLET | Freq: Two times a day (BID) | ORAL | Status: DC
  Administered 2013-10-25 – 2013-10-27 (×3): 1 via ORAL
  Filled 2013-10-25 (×6): qty 1

## 2013-10-25 MED ORDER — ONDANSETRON HCL 4 MG PO TABS: 4.0000 mg | ORAL_TABLET | Freq: Four times a day (QID) | ORAL | Status: DC | PRN

## 2013-10-25 MED ORDER — CLONAZEPAM 0.5 MG PO TABS: 0.5000 mg | ORAL_TABLET | Freq: Every evening | ORAL | Status: DC | PRN

## 2013-10-25 MED ORDER — SODIUM CHLORIDE 0.9 % IV SOLN: INTRAVENOUS | Status: DC | PRN

## 2013-10-25 MED ORDER — ACETAMINOPHEN 325 MG PO TABS
650.0000 mg | ORAL_TABLET | Freq: Four times a day (QID) | ORAL | Status: DC | PRN
  Administered 2013-10-26: 650 mg via ORAL
  Filled 2013-10-25: qty 2

## 2013-10-25 MED ORDER — ADULT MULTIVITAMIN W/MINERALS CH
1.0000 | ORAL_TABLET | Freq: Every day | ORAL | Status: DC
  Administered 2013-10-26 – 2013-10-27 (×2): 1 via ORAL
  Filled 2013-10-25 (×2): qty 1

## 2013-10-25 MED ORDER — ATORVASTATIN CALCIUM 40 MG PO TABS
40.0000 mg | ORAL_TABLET | Freq: Every day | ORAL | Status: DC
  Administered 2013-10-26: 40 mg via ORAL
  Filled 2013-10-25 (×2): qty 1

## 2013-10-25 MED ORDER — MORPHINE SULFATE 2 MG/ML IJ SOLN: 2.0000 mg | INTRAMUSCULAR | Status: DC | PRN

## 2013-10-25 MED ORDER — INSULIN REGULAR BOLUS VIA INFUSION
0.0000 [IU] | Freq: Three times a day (TID) | INTRAVENOUS | Status: DC | PRN
  Filled 2013-10-25: qty 10

## 2013-10-25 MED ORDER — ACETAMINOPHEN 650 MG RE SUPP: 650.0000 mg | Freq: Four times a day (QID) | RECTAL | Status: DC | PRN

## 2013-10-25 MED ORDER — INSULIN DETEMIR 100 UNIT/ML ~~LOC~~ SOLN
10.0000 [IU] | Freq: Every day | SUBCUTANEOUS | Status: DC
  Administered 2013-10-26: 10 [IU] via SUBCUTANEOUS
  Filled 2013-10-25: qty 0.1

## 2013-10-25 MED ORDER — HEPARIN SODIUM (PORCINE) 5000 UNIT/ML IJ SOLN
5000.0000 [IU] | Freq: Three times a day (TID) | INTRAMUSCULAR | Status: DC
  Administered 2013-10-25 – 2013-10-27 (×5): 5000 [IU] via SUBCUTANEOUS
  Filled 2013-10-25 (×8): qty 1

## 2013-10-25 MED ORDER — CYCLOBENZAPRINE HCL 10 MG PO TABS
10.0000 mg | ORAL_TABLET | Freq: Every day | ORAL | Status: DC
  Administered 2013-10-25: 10 mg via ORAL
  Filled 2013-10-25 (×4): qty 1

## 2013-10-25 MED ORDER — SODIUM CHLORIDE 0.9 % IV SOLN
INTRAVENOUS | Status: DC
  Administered 2013-10-25 – 2013-10-26 (×2): via INTRAVENOUS

## 2013-10-25 MED ORDER — INSULIN REGULAR HUMAN 100 UNIT/ML IJ SOLN
INTRAMUSCULAR | Status: AC
  Administered 2013-10-25: 100 [IU]
  Filled 2013-10-25: qty 1

## 2013-10-25 MED ORDER — HYDROCODONE-ACETAMINOPHEN 5-325 MG PO TABS
1.0000 | ORAL_TABLET | ORAL | Status: DC | PRN
  Administered 2013-10-26 – 2013-10-27 (×2): 1 via ORAL
  Filled 2013-10-25 (×2): qty 1

## 2013-10-25 MED ORDER — DEXTROSE-NACL 5-0.45 % IV SOLN
INTRAVENOUS | Status: DC
  Administered 2013-10-25: 13:00:00 via INTRAVENOUS

## 2013-10-25 MED ORDER — ALBUTEROL SULFATE HFA 108 (90 BASE) MCG/ACT IN AERS: 1.0000 | INHALATION_SPRAY | Freq: Four times a day (QID) | RESPIRATORY_TRACT | Status: DC | PRN

## 2013-10-25 MED ORDER — INSULIN DETEMIR 100 UNIT/ML ~~LOC~~ SOLN
10.0000 [IU] | Freq: Every day | SUBCUTANEOUS | Status: DC
  Filled 2013-10-25: qty 0.1

## 2013-10-25 MED ORDER — LORATADINE 10 MG PO TABS: 10.0000 mg | ORAL_TABLET | Freq: Every day | ORAL | Status: DC

## 2013-10-25 MED ORDER — MULTIVITAMINS PO CAPS: 1.0000 | ORAL_CAPSULE | Freq: Every day | ORAL | Status: DC

## 2013-10-25 NOTE — ED Notes (Signed)
Attempted to call report to RN for 1235. Unable to take report at present.

## 2013-10-25 NOTE — ED Notes (Signed)
orthostats and ekg performed while pt being triaged. Pt reports dizzyness with sitting up from lying position and standing from sitting position.

## 2013-10-25 NOTE — ED Notes (Signed)
VBG results shown to Dr. Wilkie AyeHorton.

## 2013-10-25 NOTE — Progress Notes (Signed)
[facility].  Told me pt was to be taken off of insulin after she had 3 blood sugars below 200.  Two were done in ICU.  Last one done on Med surg 5E.  It was 186.  Went to d/c drip and realized there was no order for it.  Also no order for transfer or tele. i was told i needed to call on [facility] md for the order.

## 2013-10-25 NOTE — ED Notes (Signed)
Report called to Drema PryJeff Carr RN [facility].

## 2013-10-25 NOTE — ED Notes (Signed)
Pt also reports sob "when I get up and do a lot of walking" denies at this time.

## 2013-10-25 NOTE — ED Provider Notes (Signed)
CSN: [id number]     Arrival date & time 10/25/13  1016 History   First MD Initiated Contact with Patient 10/25/13 1022     Chief Complaint  Patient presents with  . Fatigue     (Consider location/radiation/quality/duration/timing/severity/associated sxs/prior Treatment) HPI  This is a 63 year old female with history of hypertension, type 2 diabetes, hyperlipidemia who presents with generalized fatigue and dizziness. Patient reports symptoms over the last 2-3 days. 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 She reports dizziness which she describes as feeling off balance over the last 2 days. It is worse when she goes from lying to sitting. She denies any room spinning dizziness, weakness, numbness, or tingling.  Patient states that she has had increased thirst and urination her last several days as well. She reports recent prednisone taper for back pain. Last dose was Saturday. Patient states that her blood sugar on Friday was around 200. It is normally less than 110. Patient denies any fevers or recent illnesses. She denies any chest pain. She does state that she's felt "a little more short of breath" over last day or 2.  Past Medical History  Diagnosis Date  . Hypertension   . S/P hysterectomy   . GERD (gastroesophageal reflux disease)   . Hyperlipidemia   . Fibrocystic breast   . Asthma     as child and seasonal  . Neuromuscular disorder     tennis elbow on right and left arm  . Diabetes mellitus without complication    Past Surgical History  Procedure Laterality Date  . Cesarean section    . Breast surgery      biopsy rt breast-1960- benign.Left breast 1975  - lumpectomy - benign  . Abdominal hysterectomy      2003- still has left ovary  . Trigger finger release  02/16/2012    Procedure: RELEASE TRIGGER FINGER/A-1 PULLEY;  Surgeon: Cammie Sickle., MD;  Location: [place];  Service: Orthopedics;  Laterality: Right;  RIGHT  RING FINGER    Family History  Problem Relation Age of Onset  . Stroke Father    History  Substance Use Topics  . Smoking status: Never Smoker   . Smokeless tobacco: Never Used  . Alcohol Use: No   OB History   Grav Para Term Preterm Abortions TAB SAB Ect Mult Living                 Review of Systems  Constitutional: Negative for fever.  Respiratory: Positive for shortness of breath. Negative for cough and chest tightness.   Cardiovascular: Negative for chest pain.  Gastrointestinal: Negative for nausea, vomiting and abdominal pain.  Endocrine: Positive for polydipsia and polyuria.  Genitourinary: Negative for dysuria.  Musculoskeletal: Negative for back pain.  Skin: Negative for rash.  Neurological: Positive for dizziness. Negative for headaches.  Psychiatric/Behavioral: Negative for confusion.  All other systems reviewed and are negative.     Allergies  Tramadol and Percocet  Home Medications   Prior to Admission medications   Medication Sig Start Date End Date Taking? Authorizing Provider  aspirin 81 MG tablet Take 81 mg by mouth daily.    Historical Provider, MD  Blood Glucose Monitoring Suppl (BAYER CONTOUR MONITOR) W/DEVICE KIT USE AS DIRECTED 02/16/12   Elby Showers, MD  cetirizine (ZYRTEC) 10 MG tablet Take 10 mg by mouth daily.      Historical Provider, MD  chlorpheniramine-HYDROcodone (TUSSIONEX PENNKINETIC ER) 10-8 MG/5ML LQCR Take 5  mLs by mouth every 12 (twelve) hours as needed for cough. 06/06/13   Elby Showers, MD  cholecalciferol (VITAMIN D) 1000 UNITS tablet Take 1,000 Units by mouth 2 (two) times daily.    Historical Provider, MD  clonazePAM (KLONOPIN) 0.5 MG tablet Take 1 tablet (0.5 mg total) by mouth at bedtime as needed for anxiety. 01/22/13   Elby Showers, MD  CRESTOR 20 MG tablet TAKE ONE TABLET BY MOUTH ONCE DAILY 10/17/12   Elby Showers, MD  cyclobenzaprine (FLEXERIL) 10 MG tablet Take 1 tablet (10 mg total) by mouth at bedtime. 07/23/13   Elby Showers, MD  esomeprazole (NEXIUM) 20 MG capsule Take 20 mg by mouth daily before breakfast.    Historical Provider, MD  glipiZIDE (GLUCOTROL XL) 2.5 MG 24 hr tablet TAKE ONE TABLET BY MOUTH EVERY DAY IN THE MORNING 01/23/13   Elby Showers, MD  HYDROcodone-acetaminophen (NORCO) 5-325 MG per tablet Take 1 tablet by mouth 2 (two) times daily. 10/03/13   Elby Showers, MD  indomethacin (INDOCIN SR) 75 MG CR capsule TAKE ONE CAPSULE BY MOUTH TWICE DAILY WITH FOOD 03/20/13   Elby Showers, MD  KLOR-CON M20 20 MEQ tablet TAKE TWO TABLETS BY MOUTH EVERY DAY 09/19/12   Elby Showers, MD  metFORMIN (GLUCOPHAGE) 500 MG tablet TAKE ONE TABLET BY MOUTH TWICE DAILY    Elby Showers, MD  Multiple Vitamin (MULTIVITAMIN) capsule Take 1 capsule by mouth daily.      Historical Provider, MD  PredniSONE 10 MG KIT Take  as directed- 6-6-5-5-4-4-3-3-2-2-1-1-taper with food 10/03/13   Elby Showers, MD  valACYclovir (VALTREX) 500 MG tablet TAKE ONE TABLET BY MOUTH TWICE DAILY UNTIL ALL TAKEN 10/15/13   Elby Showers, MD  valsartan-hydrochlorothiazide (DIOVAN-HCT) 80-12.5 MG per tablet Take 1 tablet by mouth daily. 10/02/13   Elby Showers, MD  VENTOLIN HFA 108 (90 BASE) MCG/ACT inhaler INHALE TWO PUFFS 4 TIMES DAILY AS NEEDED FOR WHEEZING 03/22/13   Elby Showers, MD   BP 175/87  Pulse 99  Temp(Src) 98.1 F (36.7 C) (Oral)  Resp 18  Ht _0  (1.575 m)  Wt 190 lb (86.183 kg)  BMI 34.74 kg/m2  SpO2 96% Physical Exam  Nursing note and vitals reviewed. Constitutional: She is oriented to person, place, and time. She appears well-developed and well-nourished. No distress.  HENT:  Head: Normocephalic and atraumatic.  Eyes: Pupils are equal, round, and reactive to light.  Neck: Neck supple.  Cardiovascular: Normal rate, regular rhythm and normal heart sounds.   No murmur heard. Pulmonary/Chest: Effort normal and breath sounds normal. No respiratory distress. She has no wheezes.  Abdominal: Soft. Bowel sounds are normal.  There is no tenderness. There is no rebound.  Neurological: She is alert and oriented to person, place, and time.  Skin: Skin is warm and dry.  Psychiatric: She has a normal mood and affect.    ED Course  Procedures (including critical care time) Labs Review Labs Reviewed  BASIC METABOLIC PANEL - Abnormal; Notable for the following:    Chloride 91 (*)    Glucose, Bld 703 (*)    Creatinine, Ser 1.20 (*)    Calcium 10.6 (*)    GFR calc non Af Amer 47 (*)    GFR calc Af Amer 55 (*)    Anion gap 16 (*)    All other components within normal limits  URINALYSIS, ROUTINE W REFLEX MICROSCOPIC - Abnormal; Notable for the  following:    Specific Gravity, Urine 1.034 (*)    Glucose, UA >1000 (*)    Ketones, ur 15 (*)    All other components within normal limits  CBG MONITORING, ED - Abnormal; Notable for the following:    Glucose-Capillary 566 (*)    All other components within normal limits  CBG MONITORING, ED - Abnormal; Notable for the following:    Glucose-Capillary 490 (*)    All other components within normal limits  CBC WITH DIFFERENTIAL  TROPONIN I  URINE MICROSCOPIC-ADD ON  HEMOGLOBIN A1C  BLOOD GAS, VENOUS    Imaging Review Dg Chest 2 View  10/25/2013   CLINICAL DATA:  Weakness, dizziness, fatigue  EXAM: CHEST  2 VIEW  COMPARISON:  05/19/2007  FINDINGS: The heart size and mediastinal contours are within normal limits. Both lungs are clear. The visualized skeletal structures are unremarkable.  IMPRESSION: No active cardiopulmonary disease.   Electronically Signed   By: Daryll Brod M.D.   On: 10/25/2013 11:20     EKG Interpretation   Date/Time:  Friday October 25 2013 10:40:43 EDT Ventricular Rate:  87 PR Interval:  134 QRS Duration: 78 QT Interval:  344 QTC Calculation: 413 R Axis:   17 Text Interpretation:  Normal sinus rhythm Nonspecific T wave abnormality  Abnormal ECG No change from prior Confirmed by HORTON  MD, [place]  ([id number]) on 10/25/2013 10:45:28 AM       MDM   Final diagnoses:  Hyperglycemia    Patient presents with generalized weakness, fatigue, and dizziness. She is nontoxic on exam. Patient also reports to 3 days of increased polydipsia and polyuria.  Bedside CBG with a blood glucose of 566. Basic labwork obtained and patient started on a normal saline bolus.  No evidence of orthostasis. EKG is nonischemic and troponin is negative. Chest x-ray is unremarkable. Urinalysis shows 15 ketones and greater than 1000 glucose.  But no evidence of infection. Anion gap of 16 with a normal bicarbonate. Blood glucose on CMP of 703.  After 2 L of fluid patient's blood glucose still 490.  Patient placed on glucose stabilizer. VBG added to the lab work. Discussed patient with primary physician Dr. Renold Genta. Does not technically meet criteria for DKA. However, given acutely her blood sugars without significant improvement with hydration, Dr. Renold Genta agrees with admission for initiation of insulin. No evidence of infection or infarction.    Merryl Hacker, MD 10/25/13 1255

## 2013-10-25 NOTE — Telephone Encounter (Signed)
Patient reports she became very hot, thinks she overheated on Wednesday of this week. She drank a gallon of liquids,  Mostly water and some gatorade. Urinated a lot that evening and yesterday, and today she feels washed out very weak and fatigued. Also reports some SOB. Advised she needed to go to ED for evaluation. She is agreeable to this suggestion.

## 2013-10-25 NOTE — Progress Notes (Signed)
[facility] ? DKA, CBG in ED > 700. Pt started on Glucose stabilizer and request for transfer to SDU by Dr. Wilkie AyeHorton. Pt is hemodynamically stable and apparently was recently on Prednisone which likely contributed to hyperglycemic events.  Carla PrestoMAGICK-Deema Juncaj, MD  [facility] Pager [phone]  If 7PM-7AM, please contact night-coverage [email or web address] Password [email or web address]

## 2013-10-25 NOTE — ED Notes (Signed)
Pt amb to room 8 with quick steady gait in nad. 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 Pt states she has felt weak and dizzy off and on since then, dispite drinking "a lot" of water and gatorade.

## 2013-10-25 NOTE — ED Notes (Signed)
Pt ambulated to restroom with assistance.  

## 2013-10-25 NOTE — H&P (Signed)
[facility] History and Physical  Carla Oconnor ZOX:[id number] DOB: 1951-02-06 DOA: 10/25/2013  Referring physician: Dr. Dina Rich PCP: Elby Showers, MD   Chief Complaint: fatigue, dizziness and hyperglycemia  HPI: Carla Oconnor is a 63 y.o. female with PMH significant for HTN, HLD, GERD, type 2 diabetes (not on insulin), CKD stage 2 and asthma; came to the hospital secondary to increase fatigue, dizziness, polyuria, polydipsia and hyperglycemia. Patient reports she recently finish steroids tapering use for her chronic back pain and on 7/8 she exposed herself to the heat for too long. After that she described that despite increase water intake, she is constantly thirsty and with increase urination. Patient also reports increase fatigue/weakness and on day of admission was complaining of dizziness/lightheadedness sensation. Patient deneis CP, SOB, cough, HA's, nausea, vomiting, abd pain, diarrhea, dysuria, hematuria, melena, hematemesis, hematochezia or any other complaints. In ED found with acute on chronic renal failure (Cr 1.2), UA with >1000 glucose and specific gravity of 1.034 (suggesting dehydration); patient also with elevated CBG in the 7000 range. Normal bicarbonate (23). She was started on IVF's and IV insulin.  [facility] called to admit patient for further evaluation and treatment of her hyperosmolar hyperglycemic state and acute on chronic renal failure.   Review of Systems:  Negative except as otherwise mentioned on HPI  Past Medical History  Diagnosis Date  . Hypertension   . S/P hysterectomy   . GERD (gastroesophageal reflux disease)   . Hyperlipidemia   . Fibrocystic breast   . Asthma     as child and seasonal  . Neuromuscular disorder     tennis elbow on right and left arm  . Diabetes mellitus without complication    Past Surgical History  Procedure Laterality Date  . Cesarean section    . Breast surgery      biopsy rt breast-1960- benign.Left breast 1975  -  lumpectomy - benign  . Abdominal hysterectomy      2003- still has left ovary  . Trigger finger release  02/16/2012    Procedure: RELEASE TRIGGER FINGER/A-1 PULLEY;  Surgeon: Cammie Sickle., MD;  Location: [facility];  Service: Orthopedics;  Laterality: Right;  RIGHT RING FINGER    Social History:  reports that she has never smoked. She has never used smokeless tobacco. She reports that she does not drink alcohol or use illicit drugs.  Allergies  Allergen Reactions  . Tramadol   . Percocet [Oxycodone-Acetaminophen] Itching    Family History  Problem Relation Age of Onset  . Stroke Father      Prior to Admission medications   Medication Sig Start Date End Date Taking? Authorizing Provider  aspirin 81 MG tablet Take 81 mg by mouth daily.    Historical Provider, MD  Blood Glucose Monitoring Suppl (BAYER CONTOUR MONITOR) W/DEVICE KIT USE AS DIRECTED 02/16/12   Elby Showers, MD  cetirizine (ZYRTEC) 10 MG tablet Take 10 mg by mouth daily.      Historical Provider, MD  chlorpheniramine-HYDROcodone (TUSSIONEX PENNKINETIC ER) 10-8 MG/5ML LQCR Take 5 mLs by mouth every 12 (twelve) hours as needed for cough. 06/06/13   Elby Showers, MD  cholecalciferol (VITAMIN D) 1000 UNITS tablet Take 1,000 Units by mouth 2 (two) times daily.    Historical Provider, MD  clonazePAM (KLONOPIN) 0.5 MG tablet Take 1 tablet (0.5 mg total) by mouth at bedtime as needed for anxiety. 01/22/13   Elby Showers, MD  CRESTOR 20 MG tablet TAKE ONE TABLET  BY MOUTH ONCE DAILY 10/17/12   Elby Showers, MD  cyclobenzaprine (FLEXERIL) 10 MG tablet Take 1 tablet (10 mg total) by mouth at bedtime. 07/23/13   Elby Showers, MD  esomeprazole (NEXIUM) 20 MG capsule Take 20 mg by mouth daily before breakfast.    Historical Provider, MD  glipiZIDE (GLUCOTROL XL) 2.5 MG 24 hr tablet TAKE ONE TABLET BY MOUTH EVERY DAY IN THE MORNING 01/23/13   Elby Showers, MD  HYDROcodone-acetaminophen (NORCO) 5-325 MG per tablet Take 1  tablet by mouth 2 (two) times daily. 10/03/13   Elby Showers, MD  indomethacin (INDOCIN SR) 75 MG CR capsule TAKE ONE CAPSULE BY MOUTH TWICE DAILY WITH FOOD 03/20/13   Elby Showers, MD  KLOR-CON M20 20 MEQ tablet TAKE TWO TABLETS BY MOUTH EVERY DAY 09/19/12   Elby Showers, MD  metFORMIN (GLUCOPHAGE) 500 MG tablet TAKE ONE TABLET BY MOUTH TWICE DAILY    Elby Showers, MD  Multiple Vitamin (MULTIVITAMIN) capsule Take 1 capsule by mouth daily.      Historical Provider, MD  PredniSONE 10 MG KIT Take  as directed- 6-6-5-5-4-4-3-3-2-2-1-1-taper with food 10/03/13   Elby Showers, MD  valACYclovir (VALTREX) 500 MG tablet TAKE ONE TABLET BY MOUTH TWICE DAILY UNTIL ALL TAKEN 10/15/13   Elby Showers, MD  valsartan-hydrochlorothiazide (DIOVAN-HCT) 80-12.5 MG per tablet Take 1 tablet by mouth daily. 10/02/13   Elby Showers, MD  VENTOLIN HFA 108 (90 BASE) MCG/ACT inhaler INHALE TWO PUFFS 4 TIMES DAILY AS NEEDED FOR WHEEZING 03/22/13   Elby Showers, MD   Physical Exam: Filed Vitals:   10/25/13 1545  BP: 165/88  Pulse: 83  Temp: 98.4 F (36.9 C)  Resp: 18    BP 165/88  Pulse 83  Temp(Src) 98.4 F (36.9 C) (Oral)  Resp 18  Ht 5' 2" (1.575 m)  Wt 83.1 kg (183 lb 3.2 oz)  BMI 33.50 kg/m2  SpO2 96%  Genera: AAOX3, no acute distress; afebrile, cooperative with examination. HENT:  Head: Normocephalic and atraumatic.  Eyes: Pupils are equal, round, and reactive to light; no icterus and no nystagmus on exam  Neck: Neck supple; no JVD, no bruits and no thyromegaly Throat/mouth: no erythema or exudates; dry MM Cardiovascular: Normal rate, regular rhythm, s1 and s2, no rubs or gallops, no murmurs.  Pulmonary/Chest: CTA bilaterally, no wheezing or crackles Abdominal: Soft. Bowel sounds are normal. There is no tenderness. There is no rebound.  Neurological: She is alert and oriented to person, place, and time. No focal neurologic deficit Skin: Skin is warm and dry.  Psychiatric: She has a normal mood and  affect.           Labs on Admission:  Basic Metabolic Panel:  Recent Labs Lab 10/25/13 1045  NA 137  K 4.2  CL 91*  CO2 30  GLUCOSE 703*  BUN 23  CREATININE 1.20*  CALCIUM 10.6*   CBC:  Recent Labs Lab 10/25/13 1045  WBC 5.1  NEUTROABS 2.8  HGB 13.1  HCT 38.3  MCV 89.1  PLT 193   Cardiac Enzymes:  Recent Labs Lab 10/25/13 1049  TROPONINI <0.30    CBG:  Recent Labs Lab 10/25/13 1102 10/25/13 1216 10/25/13 1350 10/25/13 1448 10/25/13 1555  GLUCAP 566* 490* 372* 272* 191*    Radiological Exams on Admission: Dg Chest 2 View  10/25/2013   CLINICAL DATA:  Weakness, dizziness, fatigue  EXAM: CHEST  2 VIEW  COMPARISON:  05/19/2007  FINDINGS: The heart size and mediastinal contours are within normal limits. Both lungs are clear. The visualized skeletal structures are unremarkable.  IMPRESSION: No active cardiopulmonary disease.   Electronically Signed   By: Daryll Brod M.D.   On: 10/25/2013 11:20    EKG:  Ventricular Rate: 87  PR Interval: 134  QRS Duration: 78  QT Interval: 344  QTC Calculation: 413  R Axis: 17  Text Interpretation: Normal sinus rhythm Nonspecific T wave abnormality  Abnormal ECG No change from prior   Assessment/Plan 1-hyperosmolar hyperglycemic no ketotic state: due to uncontrolled type 2 diabetes and recent use of steroids. -will continue IVF's -continue glucostabilizer with transition orders once CBG's < 200's in 4 consecutives readings -A1C 9.4 -no infection seen -patient reports not taking hypoglycemic regimen as instructed by PCP at home -will follow clinical response -Bicarb 23  2-dehydration and orthostatic changes: due to increase diuresis with hyperglycemia and recent prolonged heat exposure. -continue fluid resuscitation  3-acute on chronic renal failure: patient with stage 2 renal failure from HTN/DM most likely; now with acute worsening function from dehydration, continue use of indomethacin, diuretics and  ARB. -will hold nephrotoxic agents -provide fluid resuscitation -will check CPK -no signs of UTI on UA -follow renal function  4-Hyperlipidemia: continue statins. -recent LDL 134  5-Hypertension: will hold ARB/HCTZ. -will start bidil BID for now  6-GE reflux: continue PPI  7-Anxiety state, unspecified: continue PRN klonopin  8-Chronic back pain: continue prn analgesics -hold indomethacin   9-Hypercalcemia: due to dehydration and use of HCTZ -will follow BMET in am after IVF's -will check TSH and phosphorus level  10-dizziness and fatigue: combination of dehydration and hyperglycemia. -symptoms already improving -will provide IVF's and treat hyperglycemia -check TSH -follow clinical response  DVT: heparin   Code Status: Full Family Communication: husband at bedside Disposition Plan: inpatient, LOS > 2 midnights; med-surg bed  Time spent: 50 minutes  Barton Dubois [facility] Pager [phone]  **Disclaimer: This note may have been dictated with voice recognition software. Similar sounding words can inadvertently be transcribed and this note may contain transcription errors which may not have been corrected upon publication of note.**

## 2013-10-26 LAB — GLUCOSE, CAPILLARY
Glucose-Capillary: 190 mg/dL — ABNORMAL HIGH (ref 70–99)
Glucose-Capillary: 251 mg/dL — ABNORMAL HIGH (ref 70–99)
Glucose-Capillary: 191 mg/dL — ABNORMAL HIGH (ref 70–99)
Glucose-Capillary: 153 mg/dL — ABNORMAL HIGH (ref 70–99)
Glucose-Capillary: 135 mg/dL — ABNORMAL HIGH (ref 70–99)
Glucose-Capillary: 114 mg/dL — ABNORMAL HIGH (ref 70–99)
Glucose-Capillary: 110 mg/dL — ABNORMAL HIGH (ref 70–99)
Glucose-Capillary: 114 mg/dL — ABNORMAL HIGH (ref 70–99)
Glucose-Capillary: 154 mg/dL — ABNORMAL HIGH (ref 70–99)
Glucose-Capillary: 255 mg/dL — ABNORMAL HIGH (ref 70–99)
Glucose-Capillary: 242 mg/dL — ABNORMAL HIGH (ref 70–99)
Glucose-Capillary: 232 mg/dL — ABNORMAL HIGH (ref 70–99)

## 2013-10-26 LAB — CBC
HCT: 32.4 % — ABNORMAL LOW (ref 36.0–46.0)
Hemoglobin: 11.4 g/dL — ABNORMAL LOW (ref 12.0–15.0)
MCH: 30.4 pg (ref 26.0–34.0)
MCHC: 35.2 g/dL (ref 30.0–36.0)
MCV: 86.4 fL (ref 78.0–100.0)
Platelets: 172 10*3/uL (ref 150–400)
RBC: 3.75 MIL/uL — ABNORMAL LOW (ref 3.87–5.11)
RDW: 12.7 % (ref 11.5–15.5)
WBC: 5 10*3/uL (ref 4.0–10.5)

## 2013-10-26 LAB — BASIC METABOLIC PANEL
Anion gap: 12 (ref 5–15)
BUN: 19 mg/dL (ref 6–23)
CO2: 27 mEq/L (ref 19–32)
Calcium: 9 mg/dL (ref 8.4–10.5)
Chloride: 105 mEq/L (ref 96–112)
Creatinine, Ser: 1.02 mg/dL (ref 0.50–1.10)
GFR calc Af Amer: 66 mL/min — ABNORMAL LOW (ref >90)
GFR calc non Af Amer: 57 mL/min — ABNORMAL LOW (ref >90)
Glucose, Bld: 119 mg/dL — ABNORMAL HIGH (ref 70–99)
Potassium: 3.6 mEq/L — ABNORMAL LOW (ref 3.7–5.3)
Sodium: 144 mEq/L (ref 137–147)

## 2013-10-26 MED ORDER — INSULIN DETEMIR 100 UNIT/ML ~~LOC~~ SOLN: 10.0000 [IU] | Freq: Every day | SUBCUTANEOUS | Status: DC

## 2013-10-26 MED ORDER — INSULIN DETEMIR 100 UNIT/ML ~~LOC~~ SOLN
10.0000 [IU] | Freq: Every day | SUBCUTANEOUS | Status: DC
  Filled 2013-10-26: qty 0.1

## 2013-10-26 MED ORDER — INSULIN ASPART 100 UNIT/ML ~~LOC~~ SOLN
0.0000 [IU] | Freq: Every day | SUBCUTANEOUS | Status: DC
  Administered 2013-10-26: 2 [IU] via SUBCUTANEOUS

## 2013-10-26 MED ORDER — GLIPIZIDE ER 5 MG PO TB24
5.0000 mg | ORAL_TABLET | Freq: Every day | ORAL | Status: DC
  Administered 2013-10-26 – 2013-10-27 (×2): 5 mg via ORAL
  Filled 2013-10-26 (×3): qty 1

## 2013-10-26 MED ORDER — METFORMIN HCL 850 MG PO TABS
850.0000 mg | ORAL_TABLET | Freq: Two times a day (BID) | ORAL | Status: DC
  Administered 2013-10-26 – 2013-10-27 (×2): 850 mg via ORAL
  Filled 2013-10-26 (×4): qty 1

## 2013-10-26 MED ORDER — INSULIN ASPART 100 UNIT/ML ~~LOC~~ SOLN
0.0000 [IU] | Freq: Three times a day (TID) | SUBCUTANEOUS | Status: DC
  Administered 2013-10-26: 8 [IU] via SUBCUTANEOUS
  Administered 2013-10-26: 3 [IU] via SUBCUTANEOUS
  Administered 2013-10-26 – 2013-10-27 (×3): 5 [IU] via SUBCUTANEOUS

## 2013-10-26 NOTE — Progress Notes (Addendum)
[facility] PROGRESS NOTE  Carla Oconnor ZOX:[id number] DOB: 1951/01/01 DOA: 10/25/2013 PCP: [name], MD  Assessment/Plan: Active Problems:   Hyperlipidemia   Hypertension   GE reflux   Anxiety state, unspecified   Hyperglycemia   Chronic back pain   Hypercalcemia   Dehydration   Acute on chronic renal failure   Hyperglycemia due to type 2 diabetes mellitus     hyperosmolar hyperglycemic no ketotic state: due to uncontrolled type 2 diabetes and recent use of steroids Off glucose stabilizer Continue sliding scale insulin and start the patient on glipizide and metformin and monitor CBG closely today Hemoglobin A1c 9.4, patient states that she had discontinued metformin, not very diligent about  checking  CBGs at home    Dehydration improved  acute on chronic renal failure: patient with stage 2 renal failure from HTN/DM most likely; now with acute worsening function from dehydration, continue use of indomethacin, diuretics and ARB.  -will hold nephrotoxic agents  -provide fluid resuscitation Follow renal function   4-Hyperlipidemia: continue statins.  -recent LDL 134  5-Hypertension: will hold ARB/HCTZ.  -will start bidil BID for now  6-GE reflux: continue PPI  7-Anxiety state, unspecified: continue PRN klonopin  8-Chronic back pain: continue prn analgesics  -hold indomethacin  9-Hypercalcemia: due to dehydration and use of HCTZ  -will follow BMET in am after IVF's  -will check TSH and phosphorus level  10-dizziness and fatigue: combination of dehydration and hyperglycemia.  -symptoms already improving  -will provide IVF's and treat hyperglycemia  -check TSH  -follow clinical response   Code Status: full Family Communication: family updated about patient's clinical progress Disposition Plan:  Discharge home tomorrow  Brief narrative: Carla Oconnor is a 63 y.o. female with PMH significant for HTN, HLD, GERD, type 2 diabetes (not on insulin),  CKD stage 2 and asthma; came to the hospital secondary to increase fatigue, dizziness, polyuria, polydipsia and hyperglycemia. Patient reports she recently finish steroids tapering use for her chronic back pain and on 7/8 she exposed herself to the heat for too long. After that she described that despite increase water intake, she is constantly thirsty and with increase urination. Patient also reports increase fatigue/weakness and on day of admission was complaining of dizziness/lightheadedness sensation. Patient deneis CP, SOB, cough, HA's, nausea, vomiting, abd pain, diarrhea, dysuria, hematuria, melena, hematemesis, hematochezia or any other complaints.  In ED found with acute on chronic renal failure (Cr 1.2), UA with >1000 glucose and specific gravity of 1.034 (suggesting dehydration); patient also with elevated CBG in the 7000 range. Normal bicarbonate (23). She was started on IVF's and IV insulin.  [facility] called to admit patient for further evaluation and treatment of her hyperosmolar hyperglycemic state and acute on chronic renal failure.   Consultants:  None  Procedures:  None  Antibiotics: None HPI/Subjective: No complaints today, no nausea no vomiting  Objective: Filed Vitals:   10/25/13 1800 10/25/13 1900 10/25/13 2015 10/26/13 0617  BP: 139/57 138/69 138/78 130/86  Pulse: 77 78 78 85  Temp:   98.3 F (36.8 C) 98.1 F (36.7 C)  TempSrc:   Oral Oral  Resp: 19 14 18 18   Height:      Weight:   83.3 kg (183 lb 10.3 oz) 83.3 kg (183 lb 10.3 oz)  SpO2: 96%  95% 94%    Intake/Output Summary (Last 24 hours) at 10/26/13 1109 Last data filed at 10/25/13 2100  Gross per 24 hour  Intake 260.28 ml  Output  0 ml  Net 260.28 ml    Exam:  HENT:  Head: Atraumatic.  Nose: Nose normal.  Mouth/Throat: Oropharynx is clear and moist.  Eyes: Conjunctivae are normal. Pupils are equal, round, and reactive to light. No scleral icterus.  Neck: Neck supple. No tracheal deviation  present.  Cardiovascular: Normal rate, regular rhythm, normal heart sounds and intact distal pulses.  Pulmonary/Chest: Effort normal and breath sounds normal. No respiratory distress.  Abdominal: Soft. Normal appearance and bowel sounds are normal. She exhibits no distension. There is no tenderness.  Musculoskeletal: She exhibits no edema and no tenderness.  Neurological: She is alert. No cranial nerve deficit.    Data Reviewed: Basic Metabolic Panel:  Recent Labs Lab 10/25/13 1045 10/25/13 1755 10/26/13 0606  NA 137 142 144  K 4.2 3.9 3.6*  CL 91* 102 105  CO2 30 26 27   GLUCOSE 703* 223* 119*  BUN 23 17 19   CREATININE 1.20* 0.87 1.02  CALCIUM 10.6* 9.8 9.0  MG  --  2.3  --   PHOS  --  2.5  --     Liver Function Tests: No results found for this basename: AST, ALT, ALKPHOS, BILITOT, PROT, ALBUMIN,  in the last 168 hours No results found for this basename: LIPASE, AMYLASE,  in the last 168 hours No results found for this basename: AMMONIA,  in the last 168 hours  CBC:  Recent Labs Lab 10/25/13 1045 10/25/13 1755 10/26/13 0606  WBC 5.1 5.3 5.0  NEUTROABS 2.8  --   --   HGB 13.1 12.3 11.4*  HCT 38.3 35.6* 32.4*  MCV 89.1 86.4 86.4  PLT 193 173 172    Cardiac Enzymes:  Recent Labs Lab 10/25/13 1049 10/25/13 1755  CKTOTAL  --  1266*  CKMB  --  3.8  TROPONINI <0.30  --    BNP (last 3 results) No results found for this basename: PROBNP,  in the last 8760 hours   CBG:  Recent Labs Lab 10/26/13 0354 10/26/13 0458 10/26/13 0544 10/26/13 0646 10/26/13 0753  GLUCAP 135* 114* 110* 114* 154*    Recent Results (from the past 240 hour(s))  MRSA PCR SCREENING     Status: Abnormal   Collection Time    10/25/13  3:50 PM      Result Value Ref Range Status   MRSA by PCR POSITIVE (*) NEGATIVE Final   Comment:            The GeneXpert MRSA Assay (FDA     approved for NASAL specimens     only), is one component of a     comprehensive MRSA colonization      surveillance program. It is not     intended to diagnose MRSA     infection nor to guide or     monitor treatment for     MRSA infections.     RESULT CALLED TO, READ BACK BY AND VERIFIED WITH:     M.STOCKS,RN AT 1910 ON 10/25/13 BY W.SHEA     Studies: Dg Chest 2 View  10/25/2013   CLINICAL DATA:  Weakness, dizziness, fatigue  EXAM: CHEST  2 VIEW  COMPARISON:  05/19/2007  FINDINGS: The heart size and mediastinal contours are within normal limits. Both lungs are clear. The visualized skeletal structures are unremarkable.  IMPRESSION: No active cardiopulmonary disease.   Electronically Signed   By: Ruel Favors M.D.   On: 10/25/2013 11:20   Dg Lumbar Spine Complete  10/08/2013   CLINICAL  DATA:  Low back pain with left sciatica  EXAM: LUMBAR SPINE - COMPLETE 4+ VIEW  COMPARISON:  CT 06/01/2010  FINDINGS: Progressive disc degeneration at L4-5 with disc space narrowing and early spurring. Remaining disc spaces are normal. No pars defect or fracture  Undigested tablets are present in the bowel.  IMPRESSION: Progressive disc degeneration at L4-5.   Electronically Signed   By: Marlan Palauharles  Clark M.D.   On: 10/08/2013 13:09    Scheduled Meds: . aspirin EC  81 mg Oral Daily  . atorvastatin  40 mg Oral q1800  . cyclobenzaprine  10 mg Oral QHS  . glipiZIDE  5 mg Oral Q breakfast  . heparin  5,000 Units Subcutaneous 3 times per day  . insulin aspart  0-15 Units Subcutaneous TID WC  . insulin aspart  0-5 Units Subcutaneous QHS  . isosorbide-hydrALAZINE  1 tablet Oral BID  . metFORMIN  850 mg Oral BID WC  . multivitamin with minerals  1 tablet Oral Daily  . pantoprazole  40 mg Oral Daily   Continuous Infusions: . sodium chloride Stopped (10/26/13 0202)    Active Problems:   Hyperlipidemia   Hypertension   GE reflux   Anxiety state, unspecified   Hyperglycemia   Chronic back pain   Hypercalcemia   Dehydration   Acute on chronic renal failure   Hyperglycemia due to type 2 diabetes  mellitus    Time spent: 40 minutes   [facility] Luc[facility] Surgery Center IncBROL,[place]  [facility] Pager [phone]. If 8PM-8AM, please contact night-coverage at [email or web address], password [email or web address] 10/26/2013, 11:09 AM  LOS: 1 day

## 2013-10-27 LAB — COMPREHENSIVE METABOLIC PANEL
ALT: 20 U/L (ref 0–35)
AST: 21 U/L (ref 0–37)
Albumin: 3.2 g/dL — ABNORMAL LOW (ref 3.5–5.2)
Alkaline Phosphatase: 68 U/L (ref 39–117)
Anion gap: 12 (ref 5–15)
BUN: 18 mg/dL (ref 6–23)
CO2: 24 mEq/L (ref 19–32)
Calcium: 8.5 mg/dL (ref 8.4–10.5)
Chloride: 104 mEq/L (ref 96–112)
Creatinine, Ser: 0.83 mg/dL (ref 0.50–1.10)
GFR calc Af Amer: 85 mL/min — ABNORMAL LOW (ref >90)
GFR calc non Af Amer: 73 mL/min — ABNORMAL LOW (ref >90)
Glucose, Bld: 237 mg/dL — ABNORMAL HIGH (ref 70–99)
Potassium: 3.8 mEq/L (ref 3.7–5.3)
Sodium: 140 mEq/L (ref 137–147)
Total Bilirubin: 0.6 mg/dL (ref 0.3–1.2)
Total Protein: 6.1 g/dL (ref 6.0–8.3)

## 2013-10-27 LAB — GLUCOSE, CAPILLARY
Glucose-Capillary: 232 mg/dL — ABNORMAL HIGH (ref 70–99)
Glucose-Capillary: 234 mg/dL — ABNORMAL HIGH (ref 70–99)

## 2013-10-27 MED ORDER — MUPIROCIN 2 % EX OINT
TOPICAL_OINTMENT | Freq: Two times a day (BID) | CUTANEOUS | Status: DC
  Administered 2013-10-27: 15:00:00 via NASAL
  Filled 2013-10-27: qty 22

## 2013-10-27 MED ORDER — METFORMIN HCL 1000 MG PO TABS: 1000.0000 mg | ORAL_TABLET | Freq: Two times a day (BID) | ORAL | Status: DC

## 2013-10-27 MED ORDER — GLIPIZIDE ER 5 MG PO TB24: 5.0000 mg | ORAL_TABLET | Freq: Every day | ORAL | Status: DC

## 2013-10-27 NOTE — Discharge Summary (Signed)
Physician Discharge Summary  Carla Oconnor MRN: [id number] DOB/AGE: [id number] y.o.  PCP: Elby Showers, MD   Admit date: 10/25/2013 Discharge date: 10/27/2013  Discharge Diagnoses:   hyperosmolar hyperglycemic no ketotic state: due to uncontrolled type 2 diabetes and recent use of steroids    Hyperlipidemia   Hypertension   GE reflux   Anxiety state, unspecified   Hyperglycemia   Chronic back pain   Hypercalcemia   Dehydration   Acute on chronic renal failure   Hyperglycemia due to type 2 diabetes mellitus  Followup recommendations #1 patient advised to follow Accu-Cheks 3 times a day, call if CBG greater than 250 PCP     Medication List         aspirin 81 MG tablet  Take 81 mg by mouth daily.     cholecalciferol 1000 UNITS tablet  Commonly known as:  VITAMIN D  Take 1,000 Units by mouth 2 (two) times daily.     clonazePAM 0.5 MG tablet  Commonly known as:  KLONOPIN  Take 1 tablet (0.5 mg total) by mouth at bedtime as needed for anxiety.     cyclobenzaprine 10 MG tablet  Commonly known as:  FLEXERIL  Take 1 tablet (10 mg total) by mouth at bedtime.     glipiZIDE 5 MG 24 hr tablet  Commonly known as:  GLUCOTROL XL  Take 1 tablet (5 mg total) by mouth daily with breakfast.     HYDROcodone-acetaminophen 5-325 MG per tablet  Commonly known as:  NORCO  Take 1 tablet by mouth 2 (two) times daily.     metFORMIN 1000 MG tablet  Commonly known as:  GLUCOPHAGE  Take 1 tablet (1,000 mg total) by mouth 2 (two) times daily with a meal.     multivitamin with minerals Tabs tablet  Take 1 tablet by mouth daily.     pantoprazole 40 MG tablet  Commonly known as:  PROTONIX  Take 40 mg by mouth daily.     potassium chloride SA 20 MEQ tablet  Commonly known as:  K-DUR,KLOR-CON  Take 40 mEq by mouth daily.     PredniSONE 10 MG Kit  Take  as directed- 6-6-5-5-4-4-3-3-2-2-1-1-taper with food     rosuvastatin 20 MG tablet  Commonly known as:  CRESTOR   Take 20 mg by mouth daily.     valsartan-hydrochlorothiazide 80-12.5 MG per tablet  Commonly known as:  DIOVAN-HCT  Take 1 tablet by mouth daily.        Discharge Condition:    Disposition: 01-Home or Self Care   Consults: None  Significant Diagnostic Studies: Dg Chest 2 View  10/25/2013   CLINICAL DATA:  Weakness, dizziness, fatigue  EXAM: CHEST  2 VIEW  COMPARISON:  05/19/2007  FINDINGS: The heart size and mediastinal contours are within normal limits. Both lungs are clear. The visualized skeletal structures are unremarkable.  IMPRESSION: No active cardiopulmonary disease.   Electronically Signed   By: Daryll Brod M.D.   On: 10/25/2013 11:20   Dg Lumbar Spine Complete  10/08/2013   CLINICAL DATA:  Low back pain with left sciatica  EXAM: LUMBAR SPINE - COMPLETE 4+ VIEW  COMPARISON:  CT 06/01/2010  FINDINGS: Progressive disc degeneration at L4-5 with disc space narrowing and early spurring. Remaining disc spaces are normal. No pars defect or fracture  Undigested tablets are present in the bowel.  IMPRESSION: Progressive disc degeneration at L4-5.   Electronically Signed   By: Franchot Gallo M.D.  On: 10/08/2013 13:09       Microbiology: Recent Results (from the past 240 hour(s))  MRSA PCR SCREENING     Status: Abnormal   Collection Time    10/25/13  3:50 PM      Result Value Ref Range Status   MRSA by PCR POSITIVE (*) NEGATIVE Final   Comment:            The GeneXpert MRSA Assay (FDA     approved for NASAL specimens     only), is one component of a     comprehensive MRSA colonization     surveillance program. It is not     intended to diagnose MRSA     infection nor to guide or     monitor treatment for     MRSA infections.     RESULT CALLED TO, READ BACK BY AND VERIFIED WITH:     M.STOCKS,RN AT 1910 ON 10/25/13 BY W.SHEA     Labs: Results for orders placed during the hospital encounter of 10/25/13 (from the past 48 hour(s))  HEMOGLOBIN A1C     Status: Abnormal    Collection Time    10/25/13 12:05 PM      Result Value Ref Range   Hemoglobin A1C 9.4 (*) <5.7 %   Comment: (NOTE)                                                                               According to the ADA Clinical Practice Recommendations for 2011, when     HbA1c is used as a screening test:      >=6.5%   Diagnostic of Diabetes Mellitus               (if abnormal result is confirmed)     5.7-6.4%   Increased risk of developing Diabetes Mellitus     References:Diagnosis and Classification of Diabetes Mellitus,Diabetes     MGQQ,7619,50(DTOIZ 1):S62-S69 and Standards of Medical Care in             Diabetes - 2011,Diabetes Care,2011,34 (Suppl 1):S11-S61.   Mean Plasma Glucose 223 (*) <117 mg/dL   Comment: Performed at [facility] MONITORING, ED     Status: Abnormal   Collection Time    10/25/13 12:16 PM      Result Value Ref Range   Glucose-Capillary 490 (*) 70 - 99 mg/dL  I-STAT VENOUS BLOOD GAS, ED     Status: Abnormal   Collection Time    10/25/13 12:53 PM      Result Value Ref Range   pH, Ven 7.323 (*) 7.250 - 7.300   pCO2, Ven 56.4 (*) 45.0 - 50.0 mmHg   pO2, Ven 29.0 (*) 30.0 - 45.0 mmHg   Bicarbonate 29.3 (*) 20.0 - 24.0 mEq/L   TCO2 31  0 - 100 mmol/L   O2 Saturation 50.0     Acid-Base Excess 2.0  0.0 - 2.0 mmol/L   Patient temperature 98.1 F     Collection site IV START     Drawn by Operator     Sample type VENOUS     Comment NOTIFIED PHYSICIAN    CBG MONITORING, ED  Status: Abnormal   Collection Time    10/25/13  1:50 PM      Result Value Ref Range   Glucose-Capillary 372 (*) 70 - 99 mg/dL  CBG MONITORING, ED     Status: Abnormal   Collection Time    10/25/13  2:48 PM      Result Value Ref Range   Glucose-Capillary 272 (*) 70 - 99 mg/dL  MRSA PCR SCREENING     Status: Abnormal   Collection Time    10/25/13  3:50 PM      Result Value Ref Range   MRSA by PCR POSITIVE (*) NEGATIVE   Comment:            The GeneXpert MRSA Assay (FDA      approved for NASAL specimens     only), is one component of a     comprehensive MRSA colonization     surveillance program. It is not     intended to diagnose MRSA     infection nor to guide or     monitor treatment for     MRSA infections.     RESULT CALLED TO, READ BACK BY AND VERIFIED WITH:     M.STOCKS,RN AT 1910 ON 10/25/13 BY W.SHEA  GLUCOSE, CAPILLARY     Status: Abnormal   Collection Time    10/25/13  3:55 PM      Result Value Ref Range   Glucose-Capillary 191 (*) 70 - 99 mg/dL  GLUCOSE, CAPILLARY     Status: Abnormal   Collection Time    10/25/13  5:42 PM      Result Value Ref Range   Glucose-Capillary 211 (*) 70 - 99 mg/dL   Comment 1 Documented in Chart     Comment 2 Notify RN    CBC     Status: Abnormal   Collection Time    10/25/13  5:55 PM      Result Value Ref Range   WBC 5.3  4.0 - 10.5 K/uL   RBC 4.12  3.87 - 5.11 MIL/uL   Hemoglobin 12.3  12.0 - 15.0 g/dL   HCT 35.6 (*) 36.0 - 46.0 %   MCV 86.4  78.0 - 100.0 fL   MCH 29.9  26.0 - 34.0 pg   MCHC 34.6  30.0 - 36.0 g/dL   RDW 12.5  11.5 - 15.5 %   Platelets 173  150 - 400 K/uL  MAGNESIUM     Status: None   Collection Time    10/25/13  5:55 PM      Result Value Ref Range   Magnesium 2.3  1.5 - 2.5 mg/dL  PHOSPHORUS     Status: None   Collection Time    10/25/13  5:55 PM      Result Value Ref Range   Phosphorus 2.5  2.3 - 4.6 mg/dL  BASIC METABOLIC PANEL     Status: Abnormal   Collection Time    10/25/13  5:55 PM      Result Value Ref Range   Sodium 142  137 - 147 mEq/L   Potassium 3.9  3.7 - 5.3 mEq/L   Chloride 102  96 - 112 mEq/L   CO2 26  19 - 32 mEq/L   Glucose, Bld 223 (*) 70 - 99 mg/dL   BUN 17  6 - 23 mg/dL   Creatinine, Ser 0.87  0.50 - 1.10 mg/dL   Calcium 9.8  8.4 - 10.5 mg/dL   GFR calc  non Af Amer 69 (*) >90 mL/min   GFR calc Af Amer 80 (*) >90 mL/min   Comment: (NOTE)     The eGFR has been calculated using the CKD EPI equation.     This calculation has not been validated in  all clinical situations.     eGFR's persistently <90 mL/min signify possible Chronic Kidney     Disease.   Anion gap 14  5 - 15  CK TOTAL AND CKMB     Status: Abnormal   Collection Time    10/25/13  5:55 PM      Result Value Ref Range   Total CK 1266 (*) 7 - 177 U/L   CK, MB 3.8  0.3 - 4.0 ng/mL   Relative Index 0.3  0.0 - 2.5   Comment: Performed at [facility]  TSH     Status: None   Collection Time    10/25/13  5:56 PM      Result Value Ref Range   TSH 0.657  0.350 - 4.500 uIU/mL   Comment: Performed at [place], CAPILLARY     Status: Abnormal   Collection Time    10/25/13  6:55 PM      Result Value Ref Range   Glucose-Capillary 179 (*) 70 - 99 mg/dL  GLUCOSE, CAPILLARY     Status: Abnormal   Collection Time    10/25/13  7:56 PM      Result Value Ref Range   Glucose-Capillary 190 (*) 70 - 99 mg/dL  GLUCOSE, CAPILLARY     Status: Abnormal   Collection Time    10/25/13  8:58 PM      Result Value Ref Range   Glucose-Capillary 186 (*) 70 - 99 mg/dL  GLUCOSE, CAPILLARY     Status: Abnormal   Collection Time    10/25/13 10:10 PM      Result Value Ref Range   Glucose-Capillary 251 (*) 70 - 99 mg/dL   Comment 1 Notify RN    GLUCOSE, CAPILLARY     Status: Abnormal   Collection Time    10/25/13 10:56 PM      Result Value Ref Range   Glucose-Capillary 329 (*) 70 - 99 mg/dL   Comment 1 Notify RN    GLUCOSE, CAPILLARY     Status: Abnormal   Collection Time    10/25/13 11:58 PM      Result Value Ref Range   Glucose-Capillary 297 (*) 70 - 99 mg/dL   Comment 1 Notify RN    GLUCOSE, CAPILLARY     Status: Abnormal   Collection Time    10/26/13  1:01 AM      Result Value Ref Range   Glucose-Capillary 251 (*) 70 - 99 mg/dL   Comment 1 Notify RN    GLUCOSE, CAPILLARY     Status: Abnormal   Collection Time    10/26/13  1:51 AM      Result Value Ref Range   Glucose-Capillary 191 (*) 70 - 99 mg/dL   Comment 1 Notify RN    GLUCOSE, CAPILLARY      Status: Abnormal   Collection Time    10/26/13  2:51 AM      Result Value Ref Range   Glucose-Capillary 153 (*) 70 - 99 mg/dL   Comment 1 Notify RN    GLUCOSE, CAPILLARY     Status: Abnormal   Collection Time    10/26/13  3:54 AM  Result Value Ref Range   Glucose-Capillary 135 (*) 70 - 99 mg/dL   Comment 1 Notify RN    GLUCOSE, CAPILLARY     Status: Abnormal   Collection Time    10/26/13  4:58 AM      Result Value Ref Range   Glucose-Capillary 114 (*) 70 - 99 mg/dL   Comment 1 Notify RN    GLUCOSE, CAPILLARY     Status: Abnormal   Collection Time    10/26/13  5:44 AM      Result Value Ref Range   Glucose-Capillary 110 (*) 70 - 99 mg/dL   Comment 1 Notify RN    BASIC METABOLIC PANEL     Status: Abnormal   Collection Time    10/26/13  6:06 AM      Result Value Ref Range   Sodium 144  137 - 147 mEq/L   Potassium 3.6 (*) 3.7 - 5.3 mEq/L   Chloride 105  96 - 112 mEq/L   CO2 27  19 - 32 mEq/L   Glucose, Bld 119 (*) 70 - 99 mg/dL   BUN 19  6 - 23 mg/dL   Creatinine, Ser 8.65  0.50 - 1.10 mg/dL   Calcium 9.0  8.4 - 16.8 mg/dL   GFR calc non Af Amer 57 (*) >90 mL/min   GFR calc Af Amer 66 (*) >90 mL/min   Comment: (NOTE)     The eGFR has been calculated using the CKD EPI equation.     This calculation has not been validated in all clinical situations.     eGFR's persistently <90 mL/min signify possible Chronic Kidney     Disease.   Anion gap 12  5 - 15  CBC     Status: Abnormal   Collection Time    10/26/13  6:06 AM      Result Value Ref Range   WBC 5.0  4.0 - 10.5 K/uL   RBC 3.75 (*) 3.87 - 5.11 MIL/uL   Hemoglobin 11.4 (*) 12.0 - 15.0 g/dL   HCT 61.0 (*) 42.4 - 73.1 %   MCV 86.4  78.0 - 100.0 fL   MCH 30.4  26.0 - 34.0 pg   MCHC 35.2  30.0 - 36.0 g/dL   RDW 92.4  38.3 - 65.4 %   Platelets 172  150 - 400 K/uL  GLUCOSE, CAPILLARY     Status: Abnormal   Collection Time    10/26/13  6:46 AM      Result Value Ref Range   Glucose-Capillary 114 (*) 70 - 99 mg/dL   GLUCOSE, CAPILLARY     Status: Abnormal   Collection Time    10/26/13  7:53 AM      Result Value Ref Range   Glucose-Capillary 154 (*) 70 - 99 mg/dL  GLUCOSE, CAPILLARY     Status: Abnormal   Collection Time    10/26/13 11:34 AM      Result Value Ref Range   Glucose-Capillary 255 (*) 70 - 99 mg/dL   Comment 1 Notify RN    GLUCOSE, CAPILLARY     Status: Abnormal   Collection Time    10/26/13  4:20 PM      Result Value Ref Range   Glucose-Capillary 242 (*) 70 - 99 mg/dL   Comment 1 Notify RN    GLUCOSE, CAPILLARY     Status: Abnormal   Collection Time    10/26/13  9:47 PM      Result Value  Ref Range   Glucose-Capillary 232 (*) 70 - 99 mg/dL   Comment 1 Documented in Chart     Comment 2 Notify RN    COMPREHENSIVE METABOLIC PANEL     Status: Abnormal   Collection Time    10/27/13  5:55 AM      Result Value Ref Range   Sodium 140  137 - 147 mEq/L   Potassium 3.8  3.7 - 5.3 mEq/L   Chloride 104  96 - 112 mEq/L   CO2 24  19 - 32 mEq/L   Glucose, Bld 237 (*) 70 - 99 mg/dL   BUN 18  6 - 23 mg/dL   Creatinine, Ser 0.83  0.50 - 1.10 mg/dL   Calcium 8.5  8.4 - 10.5 mg/dL   Total Protein 6.1  6.0 - 8.3 g/dL   Albumin 3.2 (*) 3.5 - 5.2 g/dL   AST 21  0 - 37 U/L   ALT 20  0 - 35 U/L   Alkaline Phosphatase 68  39 - 117 U/L   Total Bilirubin 0.6  0.3 - 1.2 mg/dL   GFR calc non Af Amer 73 (*) >90 mL/min   GFR calc Af Amer 85 (*) >90 mL/min   Comment: (NOTE)     The eGFR has been calculated using the CKD EPI equation.     This calculation has not been validated in all clinical situations.     eGFR's persistently <90 mL/min signify possible Chronic Kidney     Disease.   Anion gap 12  5 - 15  GLUCOSE, CAPILLARY     Status: Abnormal   Collection Time    10/27/13  7:00 AM      Result Value Ref Range   Glucose-Capillary 232 (*) 70 - 99 mg/dL   Comment 1 Notify RN    GLUCOSE, CAPILLARY     Status: Abnormal   Collection Time    10/27/13 11:36 AM      Result Value Ref Range    Glucose-Capillary 234 (*) 70 - 99 mg/dL   Comment 1 Notify RN       HPI : Carla Oconnor is a 63 y.o. female with PMH significant for HTN, HLD, GERD, type 2 diabetes (not on insulin), CKD stage 2 and asthma; came to the hospital secondary to increase fatigue, dizziness, polyuria, polydipsia and hyperglycemia. Patient reports she recently finish steroids tapering use for her chronic back pain and on 7/8 she exposed herself to the heat for too long. After that she described that despite increase water intake, she is constantly thirsty and with increase urination. Patient also reports increase fatigue/weakness and on day of admission was complaining of dizziness/lightheadedness sensation. Patient deneis CP, SOB, cough, HA's, nausea, vomiting, abd pain, diarrhea, dysuria, hematuria, melena, hematemesis, hematochezia or any other complaints.  In ED found with acute on chronic renal failure (Cr 1.2), UA with >1000 glucose and specific gravity of 1.034 (suggesting dehydration); patient also with elevated CBG in the 7000 range. Normal bicarbonate (23). She was started on IVF's and IV insulin.  [facility] called to admit patient for further evaluation and treatment of her hyperosmolar hyperglycemic state and acute on chronic renal failure  HOSPITAL COURSE:  -hyperosmolar hyperglycemic no ketotic state: due to uncontrolled type 2 diabetes and recent use of steroids Patient was admitted to step down Started on glucose stabilizer, improved overnight and transferred to the floor Patient states that she had recently discontinued metformin A1c has increased to 9.4 No signs of  any infection Patient restarted on metformin thousand twice a day, glipizide increased to 5 mg Accu-Chek stable Patient advised to check her CBG, 3 times a day   Acute on chronic renal failure use of indomethacin, diuretics and ARB Improve with fluid resuscitation  Hypertension stable continue outpatient medications     Discharge  Exam: Blood pressure 143/72, pulse 82, temperature 98.2 F (36.8 C), temperature source Oral, resp. rate 18, height 5' 2"  (1.575 m), weight 88.4 kg (194 lb 14.2 oz), SpO2 99.00%.  Cardiovascular: Normal rate, regular rhythm, normal heart sounds and intact distal pulses.  Pulmonary/Chest: Effort normal and breath sounds normal. No respiratory distress.  Abdominal: Soft. Normal appearance and bowel sounds are normal. She exhibits no distension. There is no tenderness.  Musculoskeletal: She exhibits no edema and no tenderness.  Neurological: She is alert. No cranial nerve deficit        Discharge Instructions   Diet - low sodium heart healthy    Complete by:  As directed      Diet - low sodium heart healthy    Complete by:  As directed      Increase activity slowly    Complete by:  As directed      Increase activity slowly    Complete by:  As directed            Follow-up Information   Follow up with Elby Showers, MD In 1 week.   Specialty:  Internal Medicine   Contact information:   [address] [place] [address] [phone]       Follow up with Elby Showers, MD In 1 week.   Specialty:  Internal Medicine   Contact information:   [address] [place] [address] [phone]       Signed: Reyne Dumas 10/27/2013, 11:43 AM

## 2013-10-29 ENCOUNTER — Ambulatory Visit (INDEPENDENT_AMBULATORY_CARE_PROVIDER_SITE_OTHER): Payer: BC Managed Care – PPO | Admitting: Internal Medicine

## 2013-10-29 ENCOUNTER — Telehealth: Payer: Self-pay | Admitting: Internal Medicine

## 2013-10-29 DIAGNOSIS — IMO0001 Reserved for inherently not codable concepts without codable children: Secondary | ICD-10-CM

## 2013-10-29 DIAGNOSIS — E1165 Type 2 diabetes mellitus with hyperglycemia: Principal | ICD-10-CM

## 2013-10-29 DIAGNOSIS — R110 Nausea: Secondary | ICD-10-CM

## 2013-10-29 DIAGNOSIS — R82998 Other abnormal findings in urine: Secondary | ICD-10-CM

## 2013-10-29 LAB — POCT URINALYSIS DIPSTICK
Bilirubin, UA: NEGATIVE
Nitrite, UA: NEGATIVE
Protein, UA: NEGATIVE
Spec Grav, UA: 1.02
Urobilinogen, UA: NEGATIVE
pH, UA: 5.5

## 2013-10-29 LAB — GLUCOSE, POCT (MANUAL RESULT ENTRY)

## 2013-10-29 MED ORDER — PROMETHAZINE HCL 25 MG/ML IJ SOLN
25.0000 mg | Freq: Once | INTRAMUSCULAR | Status: AC
  Administered 2013-10-29: 25 mg via INTRAMUSCULAR

## 2013-10-29 MED ORDER — PROMETHAZINE HCL 25 MG PO TABS: 25.0000 mg | ORAL_TABLET | Freq: Three times a day (TID) | ORAL | Status: DC | PRN

## 2013-10-29 NOTE — Telephone Encounter (Signed)
Bring her in this afternoon to be worked in.

## 2013-10-29 NOTE — Telephone Encounter (Signed)
Spoke with patient; appointment given for 4:30 today, 10/29/13 per Dr. Lenord FellersBaxley.  Patient will be on the way now.

## 2013-10-30 ENCOUNTER — Encounter: Payer: Self-pay | Admitting: Internal Medicine

## 2013-10-30 ENCOUNTER — Ambulatory Visit (INDEPENDENT_AMBULATORY_CARE_PROVIDER_SITE_OTHER): Payer: BC Managed Care – PPO | Admitting: Internal Medicine

## 2013-10-30 VITALS — BP 156/74 | Temp 97.8°F | Wt 190.0 lb

## 2013-10-30 DIAGNOSIS — E1165 Type 2 diabetes mellitus with hyperglycemia: Principal | ICD-10-CM

## 2013-10-30 DIAGNOSIS — IMO0001 Reserved for inherently not codable concepts without codable children: Secondary | ICD-10-CM

## 2013-10-30 LAB — POCT URINALYSIS DIPSTICK
Bilirubin, UA: NEGATIVE
Leukocytes, UA: NEGATIVE
Nitrite, UA: NEGATIVE
Protein, UA: NEGATIVE
Spec Grav, UA: 1.025
Urobilinogen, UA: NEGATIVE
pH, UA: 6

## 2013-10-30 LAB — BASIC METABOLIC PANEL
BUN: 13 mg/dL (ref 6–23)
CO2: 28 mEq/L (ref 19–32)
Calcium: 9.4 mg/dL (ref 8.4–10.5)
Chloride: 102 mEq/L (ref 96–112)
Creat: 0.85 mg/dL (ref 0.50–1.10)
Glucose, Bld: 211 mg/dL — ABNORMAL HIGH (ref 70–99)
Potassium: 3.8 mEq/L (ref 3.5–5.3)
Sodium: 139 mEq/L (ref 135–145)

## 2013-10-30 LAB — CBC WITH DIFFERENTIAL/PLATELET
Basophils Absolute: 0 10*3/uL (ref 0.0–0.1)
Basophils Relative: 0 % (ref 0–1)
Eosinophils Absolute: 0.1 10*3/uL (ref 0.0–0.7)
Eosinophils Relative: 1 % (ref 0–5)
HCT: 35.5 % — ABNORMAL LOW (ref 36.0–46.0)
Hemoglobin: 12.1 g/dL (ref 12.0–15.0)
Lymphocytes Relative: 35 % (ref 12–46)
Lymphs Abs: 1.8 10*3/uL (ref 0.7–4.0)
MCH: 30 pg (ref 26.0–34.0)
MCHC: 34.1 g/dL (ref 30.0–36.0)
MCV: 88.1 fL (ref 78.0–100.0)
Monocytes Absolute: 0.3 10*3/uL (ref 0.1–1.0)
Monocytes Relative: 6 % (ref 3–12)
Neutro Abs: 2.9 10*3/uL (ref 1.7–7.7)
Neutrophils Relative %: 58 % (ref 43–77)
Platelets: 225 10*3/uL (ref 150–400)
RBC: 4.03 MIL/uL (ref 3.87–5.11)
RDW: 13.7 % (ref 11.5–15.5)
WBC: 5 10*3/uL (ref 4.0–10.5)

## 2013-10-30 LAB — URINE CULTURE: Colony Count: 40000

## 2013-10-30 LAB — GLUCOSE, POCT (MANUAL RESULT ENTRY): POC Glucose: 251 mg/dl — AB (ref 70–99)

## 2013-10-31 NOTE — Progress Notes (Signed)
Patient informed. 

## 2013-11-01 ENCOUNTER — Telehealth: Payer: Self-pay

## 2013-11-01 LAB — ACETONE

## 2013-11-01 NOTE — Telephone Encounter (Signed)
Patient calls with blood glucose readings. 10/31/2013 fbs 232; 12:50 310mg , 3:45 250mg  9:40pm 252mg . Today FBS 228mg . Per verbal order of Dr. Lenord FellersBaxley, restart Metformin 500 mg bid ac, breakfast and dinner. Continue to monitor glucose 4 times daily. Call if nauseated. Return OV here Monday.

## 2013-11-04 ENCOUNTER — Encounter: Payer: Self-pay | Admitting: Internal Medicine

## 2013-11-04 ENCOUNTER — Ambulatory Visit (INDEPENDENT_AMBULATORY_CARE_PROVIDER_SITE_OTHER): Payer: BC Managed Care – PPO | Admitting: Internal Medicine

## 2013-11-04 VITALS — BP 124/84 | HR 76 | Wt 185.0 lb

## 2013-11-04 DIAGNOSIS — E119 Type 2 diabetes mellitus without complications: Secondary | ICD-10-CM

## 2013-11-04 NOTE — Patient Instructions (Signed)
Continue metformin and glipizide as previously directed. Return in 3 weeks.

## 2013-11-04 NOTE — Progress Notes (Signed)
   Subjective:    Patient ID: Carla Oconnor, female    DOB: 1950/06/13, 63 y.o.   MRN: [id number]  HPI  She called late last week with some readings and I had her add back metformin 500 mg twice daily in addition to glipizide. Blood sugar has come down nicely with that from over 200 to 187 and 167 at most. High-dose metformin made her sick. She has no nausea and vomiting now.    Review of Systems     Objective:   Physical Exam  Not examined. Spent 10 minutes with patient talking about these readings and when to check Accu-Cheks etc.      Assessment & Plan:  Diabetes mellitus  Hyperlipidemia  Plan: Gave her Crestor saving card at her request. Return in 3 weeks for followup. If necessary we may have to consider Lantus insulin at bedtime for better control. We need to give her some time to work with her diet and exercise regimen

## 2013-11-08 ENCOUNTER — Other Ambulatory Visit: Payer: Self-pay | Admitting: Internal Medicine

## 2013-11-09 ENCOUNTER — Other Ambulatory Visit: Payer: Self-pay | Admitting: Internal Medicine

## 2013-11-11 ENCOUNTER — Telehealth: Payer: Self-pay | Admitting: Internal Medicine

## 2013-11-11 NOTE — Telephone Encounter (Signed)
Carla QuinLinda to call her and see if she is eating enough calories and when she is taking meds.

## 2013-11-11 NOTE — Telephone Encounter (Signed)
Up and eating some peanut butter and crackers or something of that nature.  Wants to know if there's something she should be concerned with since her blood sugar is dropping that low after supper.  Please advise.

## 2013-11-12 NOTE — Telephone Encounter (Signed)
Spoke with patient. She states she is eating much more healthy diet, and feels full after meals. [facility] to [facility]. Patient is agreeable to this plan

## 2013-11-18 ENCOUNTER — Other Ambulatory Visit: Payer: Self-pay

## 2013-11-18 DIAGNOSIS — E119 Type 2 diabetes mellitus without complications: Secondary | ICD-10-CM

## 2013-11-18 MED ORDER — FINGERSTIX LANCETS MISC: Status: DC

## 2013-11-18 MED ORDER — GLIPIZIDE ER 10 MG PO TB24: 10.0000 mg | ORAL_TABLET | Freq: Every day | ORAL | Status: DC

## 2013-11-26 ENCOUNTER — Ambulatory Visit (INDEPENDENT_AMBULATORY_CARE_PROVIDER_SITE_OTHER): Payer: BC Managed Care – PPO | Admitting: Internal Medicine

## 2013-11-26 ENCOUNTER — Encounter: Payer: Self-pay | Admitting: Internal Medicine

## 2013-11-26 VITALS — BP 126/78 | HR 72 | Ht 62.5 in | Wt 187.0 lb

## 2013-11-26 DIAGNOSIS — E119 Type 2 diabetes mellitus without complications: Secondary | ICD-10-CM

## 2013-11-26 DIAGNOSIS — Z0000 Encounter for general adult medical examination without abnormal findings: Secondary | ICD-10-CM

## 2013-11-26 LAB — HEMOGLOBIN A1C
Hgb A1c MFr Bld: 8.8 % — ABNORMAL HIGH (ref <5.7)
Mean Plasma Glucose: 206 mg/dL — ABNORMAL HIGH (ref <117)

## 2013-11-26 NOTE — Progress Notes (Signed)
   Subjective:    Patient ID: Carla Oconnor, female    DOB: Sep 01, 1950, 63 y.o.   MRN: [id number]  HPI  Patient says Accu-Cheks have been running around 120 most of the time. She takes 500 mg of metformin and glipizide with breakfast. In 3 hours glucose is dropped to 90 and she needs to eat to peanut butter crackers or she feels just a bit jittery. Overall she is doing well. She is decreased metformin from 1000 to 500 mg daily and diarrhea has resolved.eats a good breakfast and seems to be getting enough calories. Is exercising. Feels well.  Has appointment see Dr. Nile RiggsShapiro for eye exam next week.  Needs to return for Pneumovax in the near future. Order placed for mammogram.  Will need influenza immunization after October 1.    Review of Systems     Objective:   Physical Exam  Not examined. Spent 15  minutes speaking with patient today      Assessment & Plan:  New onset diabetes mellitus controlled with oral agents  Plan: To return for pneumonia immunization in the next few days when we have some in. Return in 3 months for office visit in hemoglobin A 1C. Order for mammogram placed.

## 2013-11-26 NOTE — Patient Instructions (Signed)
Continue same meds and return in 3 months for office visit in hemoglobin A 1C.

## 2013-11-28 ENCOUNTER — Ambulatory Visit: Payer: BC Managed Care – PPO

## 2013-11-29 ENCOUNTER — Other Ambulatory Visit: Payer: Self-pay | Admitting: Internal Medicine

## 2013-12-03 ENCOUNTER — Ambulatory Visit: Payer: BC Managed Care – PPO

## 2013-12-05 ENCOUNTER — Ambulatory Visit: Payer: BC Managed Care – PPO

## 2013-12-10 ENCOUNTER — Ambulatory Visit
Admission: RE | Admit: 2013-12-10 | Discharge: 2013-12-10 | Disposition: A | Discharge status: Home / Self Care | Payer: BC Managed Care – PPO | Source: Ambulatory Visit | Attending: Internal Medicine | Admitting: Internal Medicine

## 2013-12-10 ENCOUNTER — Encounter: Payer: BC Managed Care – PPO | Attending: Internal Medicine

## 2013-12-10 VITALS — Ht 62.5 in | Wt 183.1 lb

## 2013-12-10 DIAGNOSIS — Z713 Dietary counseling and surveillance: Secondary | ICD-10-CM | POA: Insufficient documentation

## 2013-12-10 DIAGNOSIS — Z0000 Encounter for general adult medical examination without abnormal findings: Secondary | ICD-10-CM

## 2013-12-10 DIAGNOSIS — E119 Type 2 diabetes mellitus without complications: Secondary | ICD-10-CM | POA: Diagnosis not present

## 2013-12-10 NOTE — Progress Notes (Signed)
Patient was seen on 12/10/13 for the first of a series of three diabetes self-management courses at the [facility].  Patient Education Plan per assessed needs and concerns is to attend four course education program for Diabetes Self Management Education.  Current HbA1c: 8.8%  The following learning objectives were met by the patient during this class:  Describe diabetes  State some common risk factors for diabetes  Defines the role of glucose and insulin  Identifies type of diabetes and pathophysiology  Describe the relationship between diabetes and cardiovascular risk  State the members of the Healthcare Team  States the rationale for glucose monitoring  State when to test glucose  State their individual Target Range  State the importance of logging glucose readings  Describe how to interpret glucose readings  Identifies A1C target  Explain the correlation between A1c and eAG values  State symptoms and treatment of high blood glucose  State symptoms and treatment of low blood glucose  Explain proper technique for glucose testing  Identifies proper sharps disposal  Handouts given during class include:  Living Well with Diabetes book  Carb Counting and Meal Planning book  Meal Plan Card  Carbohydrate guide  Meal planning worksheet  Low Sodium Flavoring Tips  The diabetes portion plate  I7T to eAG Conversion Chart  Diabetes Medications  Diabetes Recommended Care Schedule  Support Group  Diabetes Success Plan  Core Class Satisfaction Survey  Follow-Up Plan:  Attend core 2

## 2013-12-12 ENCOUNTER — Ambulatory Visit: Payer: BC Managed Care – PPO

## 2013-12-17 ENCOUNTER — Encounter: Payer: BC Managed Care – PPO | Attending: Internal Medicine

## 2013-12-17 DIAGNOSIS — Z713 Dietary counseling and surveillance: Secondary | ICD-10-CM | POA: Insufficient documentation

## 2013-12-17 DIAGNOSIS — E119 Type 2 diabetes mellitus without complications: Secondary | ICD-10-CM

## 2013-12-17 NOTE — Progress Notes (Signed)

## 2013-12-23 NOTE — Progress Notes (Signed)
   Subjective:    Patient ID: Carla Oconnor, female    DOB: Aug 29, 1950, 63 y.o.   MRN: [id number]  HPI 63 year old Black Female presents today with chronic back pain for the past 3 months. Back pain is right-sided extending into right buttock. Says she's had a fair amount of pain and seems upset and anxious today. History of hypertension hyperlipidemia, diabetes mellitus, metabolic syndrome    Review of Systems     Objective:   Physical Exam Patient has some pain with lifting right leg at 90. Muscle strength is normal in the right lower extremity. Some tenderness over right posterior superior iliac spine       Assessment & Plan:  Right sciatica  Plan: LS-spine films. Hydrocodone/APAP as needed for pain. 12 day prednisone 10 mg taper.

## 2013-12-23 NOTE — Patient Instructions (Addendum)
Take steroids and hydrocodone as directed. LS-spine films to be ordered.

## 2013-12-24 DIAGNOSIS — E119 Type 2 diabetes mellitus without complications: Secondary | ICD-10-CM | POA: Diagnosis not present

## 2013-12-24 NOTE — Progress Notes (Signed)
Patient was seen on 12/24/13 for the third of a series of three diabetes self-management courses at the [facility]. The following learning objectives were met by the patient during this class:    State the amount of activity recommended for healthy living   Describe activities suitable for individual needs   Identify ways to regularly incorporate activity into daily life   Identify barriers to activity and ways to over come these barriers  Identify diabetes medications being personally used and their primary action for lowering glucose and possible side effects   Describe role of stress on blood glucose and develop strategies to address psychosocial issues   Identify diabetes complications and ways to prevent them  Explain how to manage diabetes during illness   Evaluate success in meeting personal goal   Establish 2-3 goals that they will plan to diligently work on until they return for the  13-monthfollow-up visit  Goals:  Follow Diabetes Meal Plan as instructed  Aim for 15-30 mins of physical activity daily as tolerated  Bring food record and glucose log to your follow up visit  Your patient has established the following 4 month goals in their individualized success plan:  I will count my carb choices at most meals and snacks  I will be active  4 times a week  I will take my diabetes medications as scheduled  I will eat less unhealthy fats by eating less carbs  I will test my glucose at least 2 times a day, 5 days a week  I will look at patterns in my record book at least 7 days a month  To help manage stress I will  walk at least 4 times a week  Your patient has identified these potential barriers to change:  Stress  Your patient has identified their diabetes self-care support plan as  [facility]  On-Line Resources  Plan:  Attend Core 4 in 4 months

## 2014-01-04 NOTE — Progress Notes (Signed)
   Subjective:    Patient ID: Carla Oconnor, female    DOB: 01-06-1951, 63 y.o.   MRN: [id number]  HPI Patient recently admitted to the hospital July 10 with hyperglycemia and new onset diabetes mellitus. Had been treated recently with prednisone for back pain on June 18. Also had gotten extremely hot on July 8 recently before going to hospital. Began to have polyuria and polydipsia with weakness before presenting to the hospital. Was discharged on glipizide XL 5 mg daily and metformin 1000 mg twice daily Now here for followup. Says she has been watching her diet. Has some questions about diabetic control which were answered today. Creatinine at admission was 1.20, chloride 91, serum glucose 703.  Called earlier today complaining of nausea and vomiting with metformin. Was given appointment for today on an urgent basis. Glucose in the office is 222. Serum acetone is negative. Be metastable.    Review of Systems     Objective:   Physical Exam Skin warm and dry. Nodes none. Chest clear. Cardiac exam regular rate and rhythm. Extremities without edema  Clean-catch urine shows 2+ occult blood, large glucose, 1+ ketones. Culture sent.     Assessment & Plan:  New onset  onset diabetes mellitus-currently on glipizide and metformin  Plan: Discussed placing patient on Lantus insulin in addition to glipizide and stopping metformin. Patient does not want to be on insulin. 25 minutes spent with patient

## 2014-01-05 ENCOUNTER — Encounter: Payer: Self-pay | Admitting: Internal Medicine

## 2014-01-05 NOTE — Patient Instructions (Signed)
Continue glipizide. Stay off metformin. Call later in the week with Accu-Chek readings.

## 2014-01-05 NOTE — Progress Notes (Signed)
   Subjective:    Patient ID: Carla Oconnor, female    DOB: Mar 05, 1951, 62 y.o.   MRN: [id number]  HPI  Brings in multiple Accu-Chek readings over the past 12 hours which are stable. Feels much better. Took Phenergan for nausea. Serum acetone negative.     Review of Systems     Objective:   Physical Exam  Spent 15 minutes speaking with patient. I think we can probably treat her with just oral agents.      Assessment & Plan:  New onset type 2 diabetes mellitus  Plan: Stay on glipizide. Hold metformin. Call later in the week with Accu-Chek readings

## 2014-01-05 NOTE — Patient Instructions (Addendum)
Given Phenergan for nausea. Monitor glucoses frequently over the next 12-24 hours and return tomorrow. Drink plenty of fluids.

## 2014-01-10 ENCOUNTER — Other Ambulatory Visit: Payer: Self-pay | Admitting: Internal Medicine

## 2014-01-10 NOTE — Telephone Encounter (Signed)
Refill x 6 months 

## 2014-01-20 ENCOUNTER — Telehealth: Payer: Self-pay | Admitting: Internal Medicine

## 2014-01-20 MED ORDER — FINGERSTIX LANCETS MISC: Status: DC

## 2014-01-20 NOTE — Telephone Encounter (Signed)
Lancet refill sent to pharmacy.  Patient aware.

## 2014-01-20 NOTE — Telephone Encounter (Signed)
Needs Lancets refill.  She[place] leaving to go [place] town [place] in the [place]  Forgot she needs Lancets.  She has a Primary school teacherBayer Contour meter.    Pharmacy:  [facility] [address]

## 2014-01-22 ENCOUNTER — Ambulatory Visit: Payer: BC Managed Care – PPO | Admitting: Internal Medicine

## 2014-01-27 ENCOUNTER — Other Ambulatory Visit: Payer: BC Managed Care – PPO | Admitting: Internal Medicine

## 2014-01-28 ENCOUNTER — Encounter: Payer: BC Managed Care – PPO | Admitting: Internal Medicine

## 2014-01-31 ENCOUNTER — Ambulatory Visit (INDEPENDENT_AMBULATORY_CARE_PROVIDER_SITE_OTHER): Payer: BC Managed Care – PPO | Admitting: Internal Medicine

## 2014-01-31 ENCOUNTER — Other Ambulatory Visit: Payer: Self-pay

## 2014-01-31 DIAGNOSIS — Z23 Encounter for immunization: Secondary | ICD-10-CM

## 2014-02-20 LAB — HM DIABETES EYE EXAM

## 2014-03-17 ENCOUNTER — Other Ambulatory Visit: Payer: BC Managed Care – PPO | Admitting: Internal Medicine

## 2014-03-17 DIAGNOSIS — E785 Hyperlipidemia, unspecified: Secondary | ICD-10-CM

## 2014-03-17 DIAGNOSIS — I10 Essential (primary) hypertension: Secondary | ICD-10-CM

## 2014-03-17 DIAGNOSIS — Z0000 Encounter for general adult medical examination without abnormal findings: Secondary | ICD-10-CM

## 2014-03-17 DIAGNOSIS — R739 Hyperglycemia, unspecified: Secondary | ICD-10-CM

## 2014-03-17 DIAGNOSIS — Z1321 Encounter for screening for nutritional disorder: Secondary | ICD-10-CM

## 2014-03-17 DIAGNOSIS — E1165 Type 2 diabetes mellitus with hyperglycemia: Secondary | ICD-10-CM

## 2014-03-17 DIAGNOSIS — Z1329 Encounter for screening for other suspected endocrine disorder: Secondary | ICD-10-CM

## 2014-03-17 LAB — CBC WITH DIFFERENTIAL/PLATELET
Basophils Absolute: 0 10*3/uL (ref 0.0–0.1)
Basophils Relative: 1 % (ref 0–1)
Eosinophils Absolute: 0.1 10*3/uL (ref 0.0–0.7)
Eosinophils Relative: 2 % (ref 0–5)
HCT: 34.7 % — ABNORMAL LOW (ref 36.0–46.0)
Hemoglobin: 11.5 g/dL — ABNORMAL LOW (ref 12.0–15.0)
Lymphocytes Relative: 46 % (ref 12–46)
Lymphs Abs: 1.9 10*3/uL (ref 0.7–4.0)
MCH: 29.4 pg (ref 26.0–34.0)
MCHC: 33.1 g/dL (ref 30.0–36.0)
MCV: 88.7 fL (ref 78.0–100.0)
MPV: 9.2 fL — ABNORMAL LOW (ref 9.4–12.4)
Monocytes Absolute: 0.2 10*3/uL (ref 0.1–1.0)
Monocytes Relative: 6 % (ref 3–12)
Neutro Abs: 1.8 10*3/uL (ref 1.7–7.7)
Neutrophils Relative %: 45 % (ref 43–77)
Platelets: 273 10*3/uL (ref 150–400)
RBC: 3.91 MIL/uL (ref 3.87–5.11)
RDW: 14.1 % (ref 11.5–15.5)
WBC: 4.1 10*3/uL (ref 4.0–10.5)

## 2014-03-17 LAB — COMPREHENSIVE METABOLIC PANEL
ALT: 12 U/L (ref 0–35)
AST: 16 U/L (ref 0–37)
Albumin: 4.2 g/dL (ref 3.5–5.2)
Alkaline Phosphatase: 59 U/L (ref 39–117)
BUN: 20 mg/dL (ref 6–23)
CO2: 31 mEq/L (ref 19–32)
Calcium: 9.3 mg/dL (ref 8.4–10.5)
Chloride: 101 mEq/L (ref 96–112)
Creat: 0.9 mg/dL (ref 0.50–1.10)
Glucose, Bld: 97 mg/dL (ref 70–99)
Potassium: 3.9 mEq/L (ref 3.5–5.3)
Sodium: 141 mEq/L (ref 135–145)
Total Bilirubin: 0.5 mg/dL (ref 0.2–1.2)
Total Protein: 6.5 g/dL (ref 6.0–8.3)

## 2014-03-17 LAB — LIPID PANEL
Cholesterol: 205 mg/dL — ABNORMAL HIGH (ref 0–200)
HDL: 54 mg/dL (ref >39)
LDL Cholesterol: 125 mg/dL — ABNORMAL HIGH (ref 0–99)
Total CHOL/HDL Ratio: 3.8 Ratio
Triglycerides: 131 mg/dL (ref <150)
VLDL: 26 mg/dL (ref 0–40)

## 2014-03-17 LAB — TSH: TSH: 1.646 u[IU]/mL (ref 0.350–4.500)

## 2014-03-18 LAB — HEMOGLOBIN A1C
Hgb A1c MFr Bld: 6.1 % — ABNORMAL HIGH (ref <5.7)
Mean Plasma Glucose: 128 mg/dL — ABNORMAL HIGH (ref <117)

## 2014-03-18 LAB — VITAMIN D 25 HYDROXY (VIT D DEFICIENCY, FRACTURES): Vit D, 25-Hydroxy: 21 ng/mL — ABNORMAL LOW (ref 30–100)

## 2014-03-21 ENCOUNTER — Ambulatory Visit (INDEPENDENT_AMBULATORY_CARE_PROVIDER_SITE_OTHER): Payer: BC Managed Care – PPO | Admitting: Internal Medicine

## 2014-03-21 ENCOUNTER — Ambulatory Visit
Admission: RE | Admit: 2014-03-21 | Discharge: 2014-03-21 | Disposition: A | Discharge status: Home / Self Care | Payer: BC Managed Care – PPO | Source: Ambulatory Visit | Attending: Internal Medicine | Admitting: Internal Medicine

## 2014-03-21 ENCOUNTER — Telehealth: Payer: Self-pay | Admitting: *Deleted

## 2014-03-21 ENCOUNTER — Telehealth: Payer: Self-pay

## 2014-03-21 ENCOUNTER — Encounter: Payer: Self-pay | Admitting: Internal Medicine

## 2014-03-21 VITALS — BP 132/84 | HR 74 | Temp 98.3°F | Ht 62.5 in | Wt 185.0 lb

## 2014-03-21 DIAGNOSIS — M25571 Pain in right ankle and joints of right foot: Secondary | ICD-10-CM | POA: Diagnosis not present

## 2014-03-21 DIAGNOSIS — E119 Type 2 diabetes mellitus without complications: Secondary | ICD-10-CM

## 2014-03-21 DIAGNOSIS — M5410 Radiculopathy, site unspecified: Secondary | ICD-10-CM | POA: Diagnosis not present

## 2014-03-21 DIAGNOSIS — Z0000 Encounter for general adult medical examination without abnormal findings: Secondary | ICD-10-CM | POA: Diagnosis not present

## 2014-03-21 DIAGNOSIS — I10 Essential (primary) hypertension: Secondary | ICD-10-CM | POA: Diagnosis not present

## 2014-03-21 DIAGNOSIS — Z8709 Personal history of other diseases of the respiratory system: Secondary | ICD-10-CM | POA: Diagnosis not present

## 2014-03-21 DIAGNOSIS — K219 Gastro-esophageal reflux disease without esophagitis: Secondary | ICD-10-CM

## 2014-03-21 DIAGNOSIS — E785 Hyperlipidemia, unspecified: Secondary | ICD-10-CM | POA: Diagnosis not present

## 2014-03-21 LAB — POCT URINALYSIS DIPSTICK
Bilirubin, UA: NEGATIVE
Glucose, UA: NEGATIVE
Ketones, UA: NEGATIVE
Leukocytes, UA: NEGATIVE
Nitrite, UA: NEGATIVE
Protein, UA: NEGATIVE
Spec Grav, UA: 1.01
Urobilinogen, UA: 0.2
pH, UA: 6.5

## 2014-03-21 MED ORDER — HYDROCODONE-ACETAMINOPHEN 5-325 MG PO TABS: 1.0000 | ORAL_TABLET | Freq: Two times a day (BID) | ORAL | Status: DC

## 2014-03-21 NOTE — Telephone Encounter (Signed)
-----   Message from Margaree MackintoshMary J Baxley, MD sent at 03/21/2014  2:33 PM EST ----- No foot fracture. Please call her.

## 2014-03-21 NOTE — Telephone Encounter (Signed)
Patient aware she has not foot fracture.  She will call and schedule MRI with [facility].

## 2014-03-21 NOTE — Telephone Encounter (Signed)
[employer]  Authorization  Valid 03/21/14 -04/19/14  Authorization # [id number].  [facility], They will contact patient.

## 2014-03-22 LAB — MICROALBUMIN, URINE: Microalb, Ur: 0.5 mg/dL (ref <2.0)

## 2014-03-27 ENCOUNTER — Encounter: Payer: Self-pay | Admitting: Internal Medicine

## 2014-04-04 ENCOUNTER — Ambulatory Visit
Admission: RE | Admit: 2014-04-04 | Discharge: 2014-04-04 | Disposition: A | Discharge status: Home / Self Care | Payer: BC Managed Care – PPO | Source: Ambulatory Visit | Attending: Internal Medicine | Admitting: Internal Medicine

## 2014-04-04 DIAGNOSIS — M5410 Radiculopathy, site unspecified: Secondary | ICD-10-CM

## 2014-04-09 ENCOUNTER — Telehealth: Payer: Self-pay | Admitting: *Deleted

## 2014-04-09 DIAGNOSIS — M545 Low back pain: Secondary | ICD-10-CM

## 2014-04-09 NOTE — Progress Notes (Signed)
Referral ordered and information faxed

## 2014-04-09 NOTE — Telephone Encounter (Signed)
Reviewed results with patient informed her referral has been sent for Neurosurgery consult

## 2014-04-17 ENCOUNTER — Emergency Department (HOSPITAL_BASED_OUTPATIENT_CLINIC_OR_DEPARTMENT_OTHER)
Admission: EM | Admit: 2014-04-17 | Discharge: 2014-04-17 | Disposition: A | Discharge status: Home / Self Care | Payer: BC Managed Care – PPO | Attending: Emergency Medicine | Admitting: Emergency Medicine

## 2014-04-17 DIAGNOSIS — R21 Rash and other nonspecific skin eruption: Secondary | ICD-10-CM | POA: Diagnosis not present

## 2014-04-17 DIAGNOSIS — I10 Essential (primary) hypertension: Secondary | ICD-10-CM | POA: Insufficient documentation

## 2014-04-17 DIAGNOSIS — Z8669 Personal history of other diseases of the nervous system and sense organs: Secondary | ICD-10-CM | POA: Diagnosis not present

## 2014-04-17 DIAGNOSIS — Z8742 Personal history of other diseases of the female genital tract: Secondary | ICD-10-CM | POA: Diagnosis not present

## 2014-04-17 DIAGNOSIS — E119 Type 2 diabetes mellitus without complications: Secondary | ICD-10-CM | POA: Diagnosis not present

## 2014-04-17 DIAGNOSIS — Z90710 Acquired absence of both cervix and uterus: Secondary | ICD-10-CM | POA: Diagnosis not present

## 2014-04-17 DIAGNOSIS — J45909 Unspecified asthma, uncomplicated: Secondary | ICD-10-CM | POA: Insufficient documentation

## 2014-04-17 DIAGNOSIS — Z79899 Other long term (current) drug therapy: Secondary | ICD-10-CM | POA: Insufficient documentation

## 2014-04-17 DIAGNOSIS — E785 Hyperlipidemia, unspecified: Secondary | ICD-10-CM | POA: Insufficient documentation

## 2014-04-17 DIAGNOSIS — Z7982 Long term (current) use of aspirin: Secondary | ICD-10-CM | POA: Diagnosis not present

## 2014-04-17 DIAGNOSIS — K219 Gastro-esophageal reflux disease without esophagitis: Secondary | ICD-10-CM | POA: Insufficient documentation

## 2014-04-17 MED ORDER — MUPIROCIN CALCIUM 2 % EX CREA: 1.0000 "application " | TOPICAL_CREAM | Freq: Two times a day (BID) | CUTANEOUS | Status: DC

## 2014-04-17 NOTE — Discharge Instructions (Signed)

## 2014-04-17 NOTE — ED Provider Notes (Signed)
CSN: [id number]     Arrival date & time 04/17/14  1238 History   First MD Initiated Contact with Patient 04/17/14 1446     Chief Complaint  Patient presents with  . Rash     (Consider location/radiation/quality/duration/timing/severity/associated sxs/prior Treatment) Patient is a 63 y.o. female presenting with rash. The history is provided by the patient. No language interpreter was used.  Rash Location:  Shoulder/arm and leg Shoulder/arm rash location:  L arm and R arm Leg rash location:  L lower leg and R lower leg Quality: redness   Severity:  Mild Onset quality:  Gradual Timing:  Constant Chronicity:  New Context: not animal contact   Relieved by:  Nothing Worsened by:  Nothing tried Ineffective treatments:  None tried  Pt is worried about MRSA.  Pt has had in the past Past Medical History  Diagnosis Date  . Hypertension   . S/P hysterectomy   . GERD (gastroesophageal reflux disease)   . Hyperlipidemia   . Fibrocystic breast   . Asthma     as child and seasonal  . Neuromuscular disorder     tennis elbow on right and left arm  . Diabetes mellitus without complication    Past Surgical History  Procedure Laterality Date  . Cesarean section    . Breast surgery      biopsy rt breast-1960- benign.Left breast 1975  - lumpectomy - benign  . Abdominal hysterectomy      2003- still has left ovary  . Trigger finger release  02/16/2012    Procedure: RELEASE TRIGGER FINGER/A-1 PULLEY;  Surgeon: Wyn Forsterobert V Sypher Jr., MD;  Location: [facility];  Service: Orthopedics;  Laterality: Right;  RIGHT RING FINGER    Family History  Problem Relation Age of Onset  . Stroke Father   . Asthma Other   . Hypertension Other   . Cancer Other   . Diabetes Other   . Hyperlipidemia Other    History  Substance Use Topics  . Smoking status: Never Smoker   . Smokeless tobacco: Never Used  . Alcohol Use: No   OB History    No data available     Review of Systems   Skin: Positive for rash.  All other systems reviewed and are negative.     Allergies  Tramadol and Percocet  Home Medications   Prior to Admission medications   Medication Sig Start Date End Date Taking? Authorizing Provider  aspirin 81 MG tablet Take 81 mg by mouth daily.    Historical Provider, MD  BAYER CONTOUR TEST test strip USE AS DIRECTED 11/08/13   Margaree MackintoshMary J Baxley, MD  cholecalciferol (VITAMIN D) 1000 UNITS tablet Take 1,000 Units by mouth 2 (two) times daily.    Historical Provider, MD  clonazePAM (KLONOPIN) 0.5 MG tablet TAKE ONE TABLET BY MOUTH EVERY DAY AT BEDTIME 01/10/14   Margaree MackintoshMary J Baxley, MD  CRESTOR 20 MG tablet TAKE ONE TABLET BY MOUTH ONCE DAILY 11/29/13   Margaree MackintoshMary J Baxley, MD  cyclobenzaprine (FLEXERIL) 10 MG tablet Take 1 tablet (10 mg total) by mouth at bedtime. 07/23/13   Margaree MackintoshMary J Baxley, MD  FINGERSTIX LANCETS MISC Check blood sugar 4-5 times daily. She has a [employer] Contour meter 01/20/14   Margaree MackintoshMary J Baxley, MD  glipiZIDE (GLUCOTROL XL) 10 MG 24 hr tablet Take 1 tablet (10 mg total) by mouth daily with breakfast. 11/18/13   Margaree MackintoshMary J Baxley, MD  HYDROcodone-acetaminophen (NORCO) 5-325 MG per tablet Take 1 tablet by  mouth 2 (two) times daily. 03/21/14   Margaree MackintoshMary J Baxley, MD  KLOR-CON M20 20 MEQ tablet TAKE TWO TABLETS BY MOUTH ONCE DAILY    Margaree MackintoshMary J Baxley, MD  metFORMIN (GLUCOPHAGE) 1000 MG tablet  12/18/13   Historical Provider, MD  metFORMIN (GLUCOPHAGE) 500 MG tablet Take by mouth 2 (two) times daily with a meal.    Historical Provider, MD  Multiple Vitamin (MULTIVITAMIN WITH MINERALS) TABS tablet Take 1 tablet by mouth daily.    Historical Provider, MD  mupirocin cream (BACTROBAN) 2 % Apply 1 application topically 2 (two) times daily. 04/17/14   Elson AreasLeslie K Khristie Sak, PA-C  omeprazole (PRILOSEC) 20 MG capsule Take 20 mg by mouth daily.    Historical Provider, MD  potassium chloride SA (K-DUR,KLOR-CON) 20 MEQ tablet Take 40 mEq by mouth daily.    Historical Provider, MD  promethazine (PHENERGAN) 25  MG tablet Take 1 tablet (25 mg total) by mouth every 8 (eight) hours as needed for nausea or vomiting. 10/29/13   Margaree MackintoshMary J Baxley, MD  valACYclovir (VALTREX) 500 MG tablet  10/15/13   Historical Provider, MD  valsartan-hydrochlorothiazide (DIOVAN-HCT) 80-12.5 MG per tablet Take 1 tablet by mouth daily. 10/02/13   Margaree MackintoshMary J Baxley, MD   BP 136/87 mmHg  Pulse 66  Temp(Src) 98.2 F (36.8 C) (Oral)  Resp 20  Ht 5' 2.25" (1.581 m)  Wt 179 lb (81.194 kg)  BMI 32.48 kg/m2  SpO2 99% Physical Exam  Constitutional: She appears well-developed and well-nourished.  HENT:  Head: Normocephalic.  Cardiovascular: Normal rate.   Pulmonary/Chest: Effort normal.  Abdominal: Soft.  Skin: Rash noted.  Scattered red pimples,  No oozing  Psychiatric: She has a normal mood and affect.  Nursing note and vitals reviewed.   ED Course  Procedures (including critical care time) Labs Review Labs Reviewed - No data to display  Imaging Review No results found.   EKG Interpretation None      MDM  I suspect bites/  No drainage no erythema surrounding.   I will treat with bactroban topically   Final diagnoses:  Rash        Elson AreasLeslie K Geoff Dacanay, PA-C 04/17/14 2157  Rolan BuccoMelanie Belfi, MD 04/17/14 [phone]

## 2014-04-17 NOTE — ED Notes (Signed)
Several single itching pimples on her arms and legs.

## 2014-04-18 ENCOUNTER — Encounter (HOSPITAL_BASED_OUTPATIENT_CLINIC_OR_DEPARTMENT_OTHER): Payer: Self-pay | Admitting: *Deleted

## 2014-04-21 ENCOUNTER — Encounter: Payer: BC Managed Care – PPO | Attending: Internal Medicine

## 2014-04-21 DIAGNOSIS — E119 Type 2 diabetes mellitus without complications: Secondary | ICD-10-CM | POA: Diagnosis not present

## 2014-04-21 DIAGNOSIS — Z713 Dietary counseling and surveillance: Secondary | ICD-10-CM | POA: Diagnosis not present

## 2014-04-21 NOTE — Progress Notes (Signed)
Appt start time: 0900 end time:  1000.  Patient was seen on 04/21/2013 for a review of the series of three diabetes self-management courses at the [facility]. The following learning objectives were met by the patient during this class:  . Reviewed blood glucose monitoring and interpretation including the recommended target ranges and Hgb A1c.  . Reviewed on carb counting, importance of regularly scheduled meals/snacks, and meal planning.  . Reviewed the effects of physical activity on glucose levels and long-term glucose control.  Recommended goal of 150 minutes of physical activity/week. . Reviewed patient medications and discussed role of medication on blood glucose and possible side effects. . Discussed strategies to manage stress, psychosocial issues, and other obstacles to diabetes management. . Encouraged moderate weight reduction to improve glucose levels.   . Reviewed short-term complications: hyper- and hypo-glycemia.  Discussed causes, symptoms, and treatment options. . Reviewed prevention, detection, and treatment of long-term complications.  Discussed the role of prolonged elevated glucose levels on body systems.  Goals:  Follow Diabetes Meal Plan as instructed  Eat 3 meals and 2 snacks, every 3-5 hrs  Limit carbohydrate intake to 45 grams carbohydrate/meal Limit carbohydrate intake to 15 grams carbohydrate/snack Add lean protein foods to meals/snacks  Monitor glucose levels as instructed by your doctor  Aim for goal of 15-30 mins of physical activity daily as tolerated  Bring food record and glucose log to your next nutrition visit

## 2014-04-22 ENCOUNTER — Telehealth: Payer: Self-pay | Admitting: Internal Medicine

## 2014-04-22 MED ORDER — CYCLOBENZAPRINE HCL 10 MG PO TABS: 10.0000 mg | ORAL_TABLET | Freq: Every day | ORAL | Status: DC

## 2014-04-22 MED ORDER — VALSARTAN-HYDROCHLOROTHIAZIDE 80-12.5 MG PO TABS: 1.0000 | ORAL_TABLET | Freq: Every day | ORAL | Status: DC

## 2014-04-22 NOTE — Telephone Encounter (Addendum)
Please have her call pharmacy. We have not received an order. Remind her we have lots of trouble with this pharmacy.  Have refilled both Rxs by fax and EPIC e-scribe

## 2014-04-22 NOTE — Telephone Encounter (Signed)
Patient states she [facility] [address] on Saturday for a refill on two medications and they state that they haven't heard from us.  The two meds are:  Diovan HCT 80 - 12.5 mg.  Take 1 tablet daily.   Flexeril 10 mg.  Take 1 tablet at bedtime as needed.

## 2014-04-23 ENCOUNTER — Other Ambulatory Visit: Payer: Self-pay | Admitting: *Deleted

## 2014-04-23 MED ORDER — VALSARTAN-HYDROCHLOROTHIAZIDE 80-12.5 MG PO TABS: 1.0000 | ORAL_TABLET | Freq: Every day | ORAL | Status: DC

## 2014-04-23 MED ORDER — CYCLOBENZAPRINE HCL 10 MG PO TABS: 10.0000 mg | ORAL_TABLET | Freq: Every day | ORAL | Status: DC

## 2014-04-24 ENCOUNTER — Ambulatory Visit (INDEPENDENT_AMBULATORY_CARE_PROVIDER_SITE_OTHER): Payer: BC Managed Care – PPO | Admitting: Internal Medicine

## 2014-04-24 ENCOUNTER — Encounter: Payer: Self-pay | Admitting: Internal Medicine

## 2014-04-24 VITALS — BP 138/68 | HR 75 | Temp 98.0°F | Wt 185.0 lb

## 2014-04-24 DIAGNOSIS — B029 Zoster without complications: Secondary | ICD-10-CM

## 2014-04-24 MED ORDER — VALACYCLOVIR HCL 1 G PO TABS: 1000.0000 mg | ORAL_TABLET | Freq: Three times a day (TID) | ORAL | Status: DC

## 2014-04-24 MED ORDER — TRIAMCINOLONE ACETONIDE 0.1 % EX CREA: 1.0000 "application " | TOPICAL_CREAM | Freq: Three times a day (TID) | CUTANEOUS | Status: DC

## 2014-04-24 NOTE — Patient Instructions (Signed)
Take Valtrex 500 mg 3 times daily x 7 days. Use Triamcinolone cream on rash 3 times daily

## 2014-04-24 NOTE — Progress Notes (Signed)
   Subjective:    Patient ID: Carla BarthelBarbara I Oconnor, female    DOB: May 01, 1950, 64 y.o.   MRN: [id number]  HPI Was seen in the Emergency Department December 31 and was diagnosed with presumed insect bites on arms and legs. Patient says she actually had a lesion on her right breast. Bactroban was prescribed. Today he has rash right neck area which she describes as burning and stinging. Has not changed any lotions or detergents. Has not had Zostavax vaccine.   Review of Systems     Objective:   Physical Exam   She has 3 discrete lesions on her neck and one at hairline on the right. They are not vesicular but appear to be perhaps early vesicular lesions. They have an erythematous base and are papular. They do follow a dermatome.      Assessment & Plan:  Probable herpes zoster  Plan: Triamcinolone cream 0.1% to apply topically 3 times daily  Valtrex 1 g 3 times daily for 7 days

## 2014-05-10 ENCOUNTER — Encounter (HOSPITAL_COMMUNITY): Payer: Self-pay | Admitting: Emergency Medicine

## 2014-05-10 ENCOUNTER — Emergency Department (HOSPITAL_COMMUNITY)
Admission: EM | Admit: 2014-05-10 | Discharge: 2014-05-10 | Disposition: A | Discharge status: Home / Self Care | Payer: BC Managed Care – PPO | Attending: Emergency Medicine | Admitting: Emergency Medicine

## 2014-05-10 DIAGNOSIS — K219 Gastro-esophageal reflux disease without esophagitis: Secondary | ICD-10-CM | POA: Diagnosis not present

## 2014-05-10 DIAGNOSIS — Z8614 Personal history of Methicillin resistant Staphylococcus aureus infection: Secondary | ICD-10-CM | POA: Insufficient documentation

## 2014-05-10 DIAGNOSIS — I10 Essential (primary) hypertension: Secondary | ICD-10-CM | POA: Insufficient documentation

## 2014-05-10 DIAGNOSIS — E119 Type 2 diabetes mellitus without complications: Secondary | ICD-10-CM | POA: Insufficient documentation

## 2014-05-10 DIAGNOSIS — L739 Follicular disorder, unspecified: Secondary | ICD-10-CM | POA: Insufficient documentation

## 2014-05-10 DIAGNOSIS — Z7982 Long term (current) use of aspirin: Secondary | ICD-10-CM | POA: Diagnosis not present

## 2014-05-10 DIAGNOSIS — R21 Rash and other nonspecific skin eruption: Secondary | ICD-10-CM | POA: Diagnosis present

## 2014-05-10 DIAGNOSIS — Z8669 Personal history of other diseases of the nervous system and sense organs: Secondary | ICD-10-CM | POA: Diagnosis not present

## 2014-05-10 DIAGNOSIS — J45909 Unspecified asthma, uncomplicated: Secondary | ICD-10-CM | POA: Diagnosis not present

## 2014-05-10 DIAGNOSIS — Z79899 Other long term (current) drug therapy: Secondary | ICD-10-CM | POA: Diagnosis not present

## 2014-05-10 LAB — CBG MONITORING, ED
Glucose-Capillary: 58 mg/dL — ABNORMAL LOW (ref 70–99)
Glucose-Capillary: 65 mg/dL — ABNORMAL LOW (ref 70–99)
Glucose-Capillary: 76 mg/dL (ref 70–99)

## 2014-05-10 MED ORDER — SULFAMETHOXAZOLE-TRIMETHOPRIM 800-160 MG PO TABS: 1.0000 | ORAL_TABLET | Freq: Two times a day (BID) | ORAL | Status: DC

## 2014-05-10 MED ORDER — SULFAMETHOXAZOLE-TRIMETHOPRIM 800-160 MG PO TABS: 1.0000 | ORAL_TABLET | Freq: Two times a day (BID) | ORAL | Status: AC

## 2014-05-10 NOTE — Discharge Instructions (Signed)
Please call your doctor for a followup appointment within 24-48 hours. When you talk to your doctor please let them know that you were seen in the emergency department and have them acquire all of your records so that they can discuss the findings with you and formulate a treatment plan to fully care for your new and ongoing problems. Please call and set up an appointment with your primary care provider to be seen and reassessed Please take antibiotics as prescribed and on a full stomach Please continue to monitor symptoms closely and if symptoms are to worsen or change (fever greater than 101, chills, sweating, nausea, vomiting, chest pain, shortness of breathe, difficulty breathing, weakness, numbness, tingling, worsening or changes to pain pattern, worsening or changes to rash, red streaks, neck pain, neck stiffness, neck swelling, throat closing sensation, tongue swelling) please report back to the Emergency Department immediately.  Folliculitis  Folliculitis is redness, soreness, and swelling (inflammation) of the hair follicles. This condition can occur anywhere on the body. People with weakened immune systems, diabetes, or obesity have a greater risk of getting folliculitis. CAUSES  Bacterial infection. This is the most common cause.  Fungal infection.  Viral infection.  Contact with certain chemicals, especially oils and tars. Long-term folliculitis can result from bacteria that live in the nostrils. The bacteria may trigger multiple outbreaks of folliculitis over time. SYMPTOMS Folliculitis most commonly occurs on the scalp, thighs, legs, back, buttocks, and areas where hair is shaved frequently. An early sign of folliculitis is a small, white or yellow, pus-filled, itchy lesion (pustule). These lesions appear on a red, inflamed follicle. They are usually less than 0.2 inches (5 mm) wide. When there is an infection of the follicle that goes deeper, it becomes a boil or furuncle. A group of  closely packed boils creates a larger lesion (carbuncle). Carbuncles tend to occur in hairy, sweaty areas of the body. DIAGNOSIS  Your caregiver can usually tell what is wrong by doing a physical exam. A sample may be taken from one of the lesions and tested in a lab. This can help determine what is causing your folliculitis. TREATMENT  Treatment may include:  Applying warm compresses to the affected areas.  Taking antibiotic medicines orally or applying them to the skin.  Draining the lesions if they contain a large amount of pus or fluid.  Laser hair removal for cases of long-lasting folliculitis. This helps to prevent regrowth of the hair. HOME CARE INSTRUCTIONS  Apply warm compresses to the affected areas as directed by your caregiver.  If antibiotics are prescribed, take them as directed. Finish them even if you start to feel better.  You may take over-the-counter medicines to relieve itching.  Do not shave irritated skin.  Follow up with your caregiver as directed. SEEK IMMEDIATE MEDICAL CARE IF:   You have increasing redness, swelling, or pain in the affected area.  You have a fever. MAKE SURE YOU:  Understand these instructions.  Will watch your condition.  Will get help right away if you are not doing well or get worse. Document Released: 06/13/2001 Document Revised: 10/04/2011 Document Reviewed: 07/05/2011 [facility] Patient Information 2015 [place]. This information is not intended to replace advice given to you by your health care provider. Make sure you discuss any questions you have with your health care provider.

## 2014-05-10 NOTE — ED Notes (Addendum)
Per pt, states she has rash all over, states the are scattered, raised, and "feverish"-occur at night and keep her awake-saw PCP and was given Valtrex, which did not help

## 2014-05-10 NOTE — ED Notes (Signed)
Gave patient a cup of orange juice and a ham sandwich and cheese.

## 2014-05-10 NOTE — ED Provider Notes (Signed)
Patient seen and evaluated. Discussed with PA [place].  Exam shows multiple areas of erythema to the skin. Some she is stressed and are somewhat excoriated. No abscess formation. No heart lesions. No vesicles. Not unilateral or zoster.  No new medications or exposures. Is not apparent bites, mites, scabies, or bedbugs. And he is point diagnosis likely a folliculitis. Plan will be Bactrim, Atarax, primary care follow-up.   Rolland PorterMark Edmon Magid, MD 05/10/14 1440

## 2014-05-10 NOTE — ED Provider Notes (Signed)
CSN: [id number]     Arrival date & time 05/10/14  1222 History   First MD Initiated Contact with Patient 05/10/14 1250     Chief Complaint  Patient presents with  . Rash     (Consider location/radiation/quality/duration/timing/severity/associated sxs/prior Treatment) The history is provided by the patient. No language interpreter was used.  Carla Oconnor is a 64 year old female with past mental history of hypertension, fibrocystic ovarian syndrome, neuromuscular disorder, diabetes presenting to emergency department with a rash is been ongoing since 04/11/2014. Patient reported that she noticed a bump on her right breast that has gotten larger and harder. Patient reported that she thought she was bit by an insect and misplacing itching in by cream on there. Reported that the rash went widespread spreading towards her shoulders and on 04/17/2014 she was seen in the ED setting where she was discharged with Bactroban. 2 weeks later the patient reported that the rash went widespread and later the rash spread to her arms, kneecaps bilaterally. Stated that she was started on Valtrex by her primary care provider from January 7 to 05/01/2014. Patient reported that the lesions keep forming on her skin bilaterally and are extremely itchy. Reported that she did use a new detergent November 2015-reports she continues to discharge and hasn't had any complications. Denied drainage, fever, neck pain, neck stiffness, blurred vision, sudden loss of vision, nausea, vomiting, travels, new family members or friends stay in the house, hiking, stomach pain, joint swelling, muscle pain, arthralgia. PCP Dr. Lenord Fellers  Past Medical History  Diagnosis Date  . Hypertension   . S/P hysterectomy   . GERD (gastroesophageal reflux disease)   . Hyperlipidemia   . Fibrocystic breast   . Asthma     as child and seasonal  . Neuromuscular disorder     tennis elbow on right and left arm  . Diabetes mellitus without complication     Past Surgical History  Procedure Laterality Date  . Cesarean section    . Breast surgery      biopsy rt breast-1960- benign.Left breast 1975  - lumpectomy - benign  . Abdominal hysterectomy      2003- still has left ovary  . Trigger finger release  02/16/2012    Procedure: RELEASE TRIGGER FINGER/A-1 PULLEY;  Surgeon: Wyn Forster., MD;  Location: [facility];  Service: Orthopedics;  Laterality: Right;  RIGHT RING FINGER    Family History  Problem Relation Age of Onset  . Stroke Father   . Asthma Other   . Hypertension Other   . Cancer Other   . Diabetes Other   . Hyperlipidemia Other    History  Substance Use Topics  . Smoking status: Never Smoker   . Smokeless tobacco: Never Used  . Alcohol Use: No   OB History    No data available     Review of Systems  Constitutional: Negative for fever and chills.  Respiratory: Negative for shortness of breath.   Cardiovascular: Negative for chest pain.  Gastrointestinal: Negative for nausea and vomiting.  Musculoskeletal: Negative for myalgias, joint swelling, arthralgias, neck pain and neck stiffness.  Skin: Positive for rash.      Allergies  Tramadol and Percocet  Home Medications   Prior to Admission medications   Medication Sig Start Date End Date Taking? Authorizing Provider  albuterol (PROVENTIL HFA;VENTOLIN HFA) 108 (90 BASE) MCG/ACT inhaler Inhale 2 puffs into the lungs every 6 (six) hours as needed for wheezing or shortness of breath.  Yes Historical Provider, MD  aspirin 81 MG tablet Take 81 mg by mouth daily.   Yes Historical Provider, MD  cholecalciferol (VITAMIN D) 1000 UNITS tablet Take 1,000 Units by mouth 2 (two) times daily.   Yes Historical Provider, MD  clonazePAM (KLONOPIN) 0.5 MG tablet Take 0.5 mg by mouth at bedtime as needed (sleep).   Yes Historical Provider, MD  CRESTOR 20 MG tablet TAKE ONE TABLET BY MOUTH ONCE DAILY 11/29/13  Yes [name], MD  cyclobenzaprine (FLEXERIL)  10 MG tablet Take 1 tablet (10 mg total) by mouth at bedtime. Patient taking differently: Take 10 mg by mouth at bedtime as needed for muscle spasms.  04/23/14  Yes [name], MD  diphenhydrAMINE (BENYLIN) 12.5 MG/5ML syrup Take 25 mg by mouth 4 (four) times daily as needed for itching or allergies.   Yes Historical Provider, MD  glipiZIDE (GLUCOTROL XL) 10 MG 24 hr tablet Take 1 tablet (10 mg total) by mouth daily with breakfast. 11/18/13  Yes [name], MD  HYDROcodone-acetaminophen (NORCO) 5-325 MG per tablet Take 1 tablet by mouth 2 (two) times daily. Patient taking differently: Take 1 tablet by mouth 2 (two) times daily as needed for moderate pain.  03/21/14  Yes [name], MD  metFORMIN (GLUCOPHAGE) 500 MG tablet Take 500 mg by mouth 2 (two) times daily with a meal.    Yes Historical Provider, MD  Multiple Vitamin (MULTIVITAMIN WITH MINERALS) TABS tablet Take 1 tablet by mouth daily.   Yes Historical Provider, MD  mupirocin cream (BACTROBAN) 2 % Apply 1 application topically 2 (two) times daily. 04/17/14  Yes Lonia Skinner Sofia, PA-C  omeprazole (PRILOSEC) 20 MG capsule Take 20 mg by mouth daily as needed (heart burn).    Yes Historical Provider, MD  potassium chloride SA (K-DUR,KLOR-CON) 20 MEQ tablet Take 40 mEq by mouth every other day.    Yes Historical Provider, MD  triamcinolone cream (KENALOG) 0.1 % Apply 1 application topically 3 (three) times daily. 04/24/14  Yes [name], MD  valsartan-hydrochlorothiazide (DIOVAN-HCT) 80-12.5 MG per tablet Take 1 tablet by mouth daily. 04/23/14  Yes [name], MD  BAYER CONTOUR TEST test strip USE AS DIRECTED 11/08/13   [name], MD  clonazePAM (KLONOPIN) 0.5 MG tablet TAKE ONE TABLET BY MOUTH EVERY DAY AT BEDTIME Patient not taking: Reported on 05/10/2014 01/10/14   [name], MD  [facility] LANCETS MISC Check blood sugar 4-5 times daily. 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 Patient not taking: Reported on 05/10/2014 10/29/13   [name], MD  sulfamethoxazole-trimethoprim (BACTRIM DS,SEPTRA DS) 800-160 MG per tablet Take 1 tablet by mouth 2 (two) times daily. 05/10/14 05/17/14  Keymani Glynn, PA-C  valACYclovir (VALTREX) 1000 MG tablet Take 1 tablet (1,000 mg total) by mouth 3 (three) times daily. Patient not taking: Reported on 05/10/2014 04/24/14   [name], MD   BP 137/71 mmHg  Pulse 72  Temp(Src) 97.7 F (36.5 C) (Oral)  Resp 16  SpO2 98% Physical Exam  Constitutional: She is oriented to person, place, and time. She appears well-developed and well-nourished. No distress.  HENT:  Head: Normocephalic and atraumatic.  Eyes: Conjunctivae and EOM are normal. Pupils are equal, round, and reactive to light. Right eye exhibits no discharge. Left eye exhibits no discharge.  Neck: Normal range of motion. Neck supple. No tracheal deviation present.  Cardiovascular: Normal rate, regular rhythm and normal heart sounds.  Exam reveals no friction rub.   No murmur heard. Pulses:      Radial pulses are 2+ on the right side, and 2+ on the left side.  Pulmonary/Chest: Effort normal and breath sounds normal. No respiratory distress. She has no wheezes. She has no rales.  Musculoskeletal: Normal range of motion.  Lymphadenopathy:    She has no cervical adenopathy.  Neurological: She is alert and oriented to person, place, and time. No cranial nerve deficit. She exhibits normal muscle tone. Coordination normal.  Skin: Rash noted. She is not diaphoretic.  Psychiatric: She has a normal mood and affect. Her behavior is normal. Thought content normal.  Nursing note and vitals reviewed.   ED Course  Procedures (including critical care time)  Results  for orders placed or performed during the hospital encounter of 05/10/14  CBG monitoring, ED  Result Value Ref Range   Glucose-Capillary 65 (L) 70 - 99 mg/dL  CBG monitoring, ED  Result Value Ref Range   Glucose-Capillary 58 (L) 70 - 99 mg/dL   Comment 1 Notify RN   CBG monitoring, ED  Result Value Ref Range   Glucose-Capillary 76 70 - 99 mg/dL    Labs Review Labs Reviewed  CBG MONITORING, ED - Abnormal; Notable for the following:    Glucose-Capillary 65 (*)    All other components within normal limits  CBG MONITORING, ED - Abnormal; Notable for the following:    Glucose-Capillary 58 (*)    All other components within normal limits  CBG MONITORING, ED    Imaging Review No results found.   EKG Interpretation None      MDM   Final diagnoses:  Folliculitis  Rash and nonspecific skin eruption    Medications - No data to display  Filed Vitals:   05/10/14 1250  BP: 137/71  Pulse: 72  Temp: 97.7 F (36.5 C)  TempSrc: Oral  Resp: 16  SpO2: 98%   Patient presenting to the emergency department with rash is been ongoing since 04/11/2014. Patient is been seen in the ED setting on 04/17/2014 and started on Bactroban. Patient has been seen and assessed by her primary doctor and started on Valtrex without relief. Rash appears to be a folliculitis identified widespread to the body. Patient seen and assessed by attending physician, Dr. Rolland PorterMark James to recommends patient to be started on Bactrim. CBG initially was 65-patient given food and fluids. Second CBG was 58, repeated was 76-discussed case with Dr. Fayrene FearingJames, as per physician recommends patient be discharged. Patient denied any symptomatically complaints. Doubt shingles since it's bilateral. Doubt Maricela. Doubt bedbugs. Doubt scabies. Suspicion to be folliculitis. Patient started on Bactrim secondary to history of MRSA. Patient stable, afebrile. Patient septic appearing. Discharged patient. Referred patient to PCP. Discussed  with patient to avoid any physical shortness activity. Discussed with patient to closely monitor symptoms and if symptoms are to worsen or change to report back to the ED - strict return instructions given.  Patient agreed to plan of care, understood, all questions answered.   Raymon MuttonMarissa Kenyada Dosch, PA-C 05/10/14 1545  Rolland PorterMark James, MD 05/11/14 [phone]

## 2014-05-21 ENCOUNTER — Telehealth: Payer: Self-pay | Admitting: *Deleted

## 2014-05-23 NOTE — Telephone Encounter (Signed)
Patient didn't return call

## 2014-07-12 NOTE — Progress Notes (Signed)
Subjective:    Patient ID: Carla Oconnor, female    DOB: 30-Jun-1950, 64 y.o.   MRN: [id number]  HPI 64 year old Black Female in today for health maintenance exam and evaluation of medical problems. She has a history of type 2 diabetes mellitus, GE reflux, asthma, allergic rhinitis, fibrocystic disease.  Past medical history: History of right oophorectomy in 2003. Dr. Duane LopeAlan Ross is GYN physician. History of left lateral epicondylitis 2013. Carpal tunnel release right hand October 2013 by Dr. Teressa SenterSypher. Cesarean sections 1975 and 1977. Benign right breast biopsy done in 1966 and also 1976. Right trigger finger release September 2009. Right third trigger finger treated July 2007.  Social history: She is married. Has one set of twins a boy and a girl in their 30s. She also has another son. 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 She has never smoked. No alcohol consumption. Husband is a English as a second language teachernatural gas operator.  Family history: Father died at age 65 with a stroke. Mother with history of diabetes and hypertension. One brother in fairly good health who has had some trouble with crack addiction. Total of 4 sisters. She has to hold sisters and 2 half sisters. One sister died of kidney failure and breast cancer. One sister with history of diabetes and coronary artery disease.  Drug intolerances: Prevacid causes hives. Patient says ibuprofen causes palpitations.  In the Spring of 2014 she was seen by cardiologist regarding chest pain. She had a Cardiolite study which was negative. She had a previous Cardiolite study in 2009 that was negative. History of cardiac catheterization by Dr. Nicolette Bangsyinger in 2003.  Eye  exams are done by Dr. Nile RiggsShapiro and are due in the Fall of each year.    Review of Systems history of anxiety. New complaint is low back pain.     Objective:   Physical Exam  Constitutional: She is oriented to person, place, and time. She appears well-developed and  well-nourished. No distress.  HENT:  Head: Normocephalic and atraumatic.  Right Ear: External ear normal.  Left Ear: External ear normal.  Mouth/Throat: Oropharynx is clear and moist. No oropharyngeal exudate.  Eyes: Conjunctivae and EOM are normal. Pupils are equal, round, and reactive to light. Right eye exhibits no discharge. Left eye exhibits no discharge. No scleral icterus.  Neck: Neck supple. No JVD present. No thyromegaly present.  Cardiovascular: Normal rate, regular rhythm, normal heart sounds and intact distal pulses.   No murmur heard. Pulmonary/Chest: Effort normal and breath sounds normal. No respiratory distress. She has no wheezes. She has no rales.  Abdominal: Soft. Bowel sounds are normal. She exhibits no distension and no mass. There is no tenderness. There is no rebound and no guarding.  Genitourinary:  Pap taken of vaginal cuff 2014. No masses. Patient is status post hysterectomy with right oophorectomy.  Musculoskeletal: Normal range of motion. She exhibits no edema or tenderness.  Lymphadenopathy:    She has no cervical adenopathy.  Neurological: She is alert and oriented to person, place, and time. She has normal reflexes. She displays normal reflexes. No cranial nerve deficit. Coordination normal.  Skin: Skin is warm and dry. No rash noted. She is not diaphoretic. No erythema. No pallor.  Psychiatric: She has a normal mood and affect. Her behavior is normal. Judgment and thought content normal.  Vitals reviewed.  Straight leg raising is negative at 90 bilaterally. Muscle strength normal and lower extremities. Deep tendon reflexes 2+ and symmetrical  Assessment & Plan:  Hypertension-stable medication  GE reflux-stable on PPI  Type 2 diabetes mellitus-controlled. Hemoglobin A1c 6.1%.  Hyperlipidemia-patient is on Crestor. LDL is 125. Watch diet.  Allergic rhinitis  History of asthma  Low-back pain-patient is having low back pain radiating into left  buttock and down left leg consistent with radiculopathy. Prescribed hydrocodone/APAP. Have MRI of the LS-spine.  Right foot and ankle pain-have x-ray of foot looking especially at fourth and fifth metatarsals for possible fracture  Plan: See above regarding MRI of LS-spine and right foot x-ray. Return in 6 months. Continue same medications.  Addendum: Patient referred to neurosurgery after reviewing results of MRI.

## 2014-07-14 NOTE — Patient Instructions (Signed)
Take hydrocodone/APAP as needed for back pain. Continue other medications as previously prescribed. Return in 6 months.

## 2014-07-22 ENCOUNTER — Other Ambulatory Visit: Payer: Self-pay | Admitting: Internal Medicine

## 2014-08-27 ENCOUNTER — Telehealth: Payer: Self-pay | Admitting: Internal Medicine

## 2014-08-27 ENCOUNTER — Other Ambulatory Visit: Payer: Self-pay | Admitting: *Deleted

## 2014-08-27 MED ORDER — HYDROCODONE-ACETAMINOPHEN 5-325 MG PO TABS
1.0000 | ORAL_TABLET | Freq: Two times a day (BID) | ORAL | Status: DC | PRN
Start: 2014-08-27 — End: 2014-12-02

## 2014-08-27 NOTE — Telephone Encounter (Signed)
Would like to get a refill Rx on her Norco 5-325mg .  She takes it 1 tablet twice daily as needed for moderate pain.  Last filled Dec. 2015.  She has made an appointment for 6 month follow up on 6/10.    Please call when ready for pick up.

## 2014-08-27 NOTE — Telephone Encounter (Signed)
Refill once 

## 2014-08-29 NOTE — Telephone Encounter (Signed)
Patient called; adv Rx is ready for pick up.

## 2014-09-23 ENCOUNTER — Other Ambulatory Visit: Payer: BC Managed Care – PPO | Admitting: Internal Medicine

## 2014-09-23 DIAGNOSIS — E785 Hyperlipidemia, unspecified: Secondary | ICD-10-CM

## 2014-09-23 DIAGNOSIS — E119 Type 2 diabetes mellitus without complications: Secondary | ICD-10-CM

## 2014-09-23 DIAGNOSIS — Z79899 Other long term (current) drug therapy: Secondary | ICD-10-CM

## 2014-09-23 LAB — LIPID PANEL
Cholesterol: 277 mg/dL — ABNORMAL HIGH (ref 0–200)
HDL: 47 mg/dL (ref >=46)
LDL Cholesterol: 175 mg/dL — ABNORMAL HIGH (ref 0–99)
Total CHOL/HDL Ratio: 5.9 Ratio
Triglycerides: 275 mg/dL — ABNORMAL HIGH (ref <150)
VLDL: 55 mg/dL — ABNORMAL HIGH (ref 0–40)

## 2014-09-23 LAB — HEPATIC FUNCTION PANEL
ALT: 15 U/L (ref 0–35)
AST: 17 U/L (ref 0–37)
Albumin: 4.1 g/dL (ref 3.5–5.2)
Alkaline Phosphatase: 64 U/L (ref 39–117)
Bilirubin, Direct: 0.1 mg/dL (ref 0.0–0.3)
Indirect Bilirubin: 0.3 mg/dL (ref 0.2–1.2)
Total Bilirubin: 0.4 mg/dL (ref 0.2–1.2)
Total Protein: 6.9 g/dL (ref 6.0–8.3)

## 2014-09-23 LAB — HEMOGLOBIN A1C
Hgb A1c MFr Bld: 6.3 % — ABNORMAL HIGH (ref <5.7)
Mean Plasma Glucose: 134 mg/dL — ABNORMAL HIGH (ref <117)

## 2014-09-25 ENCOUNTER — Other Ambulatory Visit: Payer: BC Managed Care – PPO | Admitting: Internal Medicine

## 2014-09-26 ENCOUNTER — Encounter: Payer: Self-pay | Admitting: Internal Medicine

## 2014-09-26 ENCOUNTER — Ambulatory Visit (INDEPENDENT_AMBULATORY_CARE_PROVIDER_SITE_OTHER): Payer: BC Managed Care – PPO | Admitting: Internal Medicine

## 2014-09-26 VITALS — BP 132/68 | HR 94 | Temp 98.1°F | Wt 191.0 lb

## 2014-09-26 DIAGNOSIS — M549 Dorsalgia, unspecified: Secondary | ICD-10-CM | POA: Diagnosis not present

## 2014-09-26 DIAGNOSIS — E785 Hyperlipidemia, unspecified: Secondary | ICD-10-CM

## 2014-09-26 DIAGNOSIS — F439 Reaction to severe stress, unspecified: Secondary | ICD-10-CM

## 2014-09-26 DIAGNOSIS — G8929 Other chronic pain: Secondary | ICD-10-CM

## 2014-09-26 DIAGNOSIS — M4316 Spondylolisthesis, lumbar region: Secondary | ICD-10-CM

## 2014-09-26 DIAGNOSIS — Z658 Other specified problems related to psychosocial circumstances: Secondary | ICD-10-CM

## 2014-09-26 DIAGNOSIS — E119 Type 2 diabetes mellitus without complications: Secondary | ICD-10-CM

## 2014-09-26 DIAGNOSIS — M79671 Pain in right foot: Secondary | ICD-10-CM | POA: Diagnosis not present

## 2014-09-26 NOTE — Progress Notes (Signed)
   Subjective:    Patient ID: Carla Oconnor, female    DOB: [birth year], 64 y.o.   MRN: [id number]  HPI  For follow up of DM and hyperlipidemia. Has chronic back pain and wants to see chiropractor instead of epidural steroid injections. Hx L4-5 spondylolisthesis. She wants to be referred to [facility]rop[facility]. We will arrange that.  She continues to have issues with chronic right lateral foot pain along fifth metatarsal. X-ray was negative. Sometimes it hurts after walking most of the day. It is helped by Aleve and soaking it. Have suggested she ice it down for 20 minutes at night and try Aleve twice daily when she's going to be walking on specific days. Says sometimes it gets puffy. If this does not help she will need to see orthopedist. She has not seen orthopedist regarding her foot pain.  She's had her mother with her recently and is doing a lot of cooking. Mother is going home to [place] next week. Unfortunately she's been taking half of 20 mg of Crestor daily and she has significant hyperlipidemia and hypertriglyceridemia. We will have her take 20 mg of Crestor daily and return in 8 weeks for lipid panel liver functions and office visit. These were reviewed with her today.  Hemoglobin A 1C is elevated at 6.3% and previously was 6.1%. Not doing as much walking because mother has been at home with her. This was reviewed with her as well.    Review of Systems     Objective:   Physical Exam Skin warm and dry. Nodes none. Neck supple without thyromegaly. Chest clear. Cardiac exam regular rate and rhythm normal S1 and S2. No redness or tenderness right fifth metatarsal.       Assessment & Plan:  Hyperlipidemia-needs take Crestor 20 mg daily and not half of 20 mg tablet. Return in 8 weeks for follow-up with lipid panel liver functions and office visit. Significant hypercholesterolemia and hypertriglyceridemia on 10 mg of Crestor daily area has been doing a lot of cooking at home.  Chronic  back pain-takes hydrocodone/APAP but recent prescription pill did not seem to be quite as strong has to take a whole pill instead of a half a pill. Was to see chiropractor. History of spondylolisthesis L4-L5  Right lateral foot pain-no injury noted on x-ray. Recommend ice on days she is walking for 20 minutes after exercise. Take Aleve twice daily on days that she is walking.  Situational stress-mother going home soon  GE reflux-stable  Type 2 diabetes mellitus-hemoglobin A1c slightly elevated. Continue diet and exercise

## 2014-09-26 NOTE — Patient Instructions (Addendum)
Repeat lipid panel and liver functions on Crestor 20 mg daily in 8 weeks with OV. Referral made to chiropractor. May take Aleve twice daily for foot going on days that she is walking a lot. Ice foot down for 20 minutes daily.

## 2014-09-28 ENCOUNTER — Encounter (HOSPITAL_COMMUNITY): Payer: Self-pay | Admitting: *Deleted

## 2014-09-28 ENCOUNTER — Emergency Department (HOSPITAL_COMMUNITY): Payer: BC Managed Care – PPO

## 2014-09-28 ENCOUNTER — Emergency Department (HOSPITAL_COMMUNITY)
Admission: EM | Admit: 2014-09-28 | Discharge: 2014-09-28 | Disposition: A | Discharge status: Home / Self Care | Payer: BC Managed Care – PPO | Attending: Emergency Medicine | Admitting: Emergency Medicine

## 2014-09-28 DIAGNOSIS — Z9889 Other specified postprocedural states: Secondary | ICD-10-CM | POA: Insufficient documentation

## 2014-09-28 DIAGNOSIS — E119 Type 2 diabetes mellitus without complications: Secondary | ICD-10-CM | POA: Insufficient documentation

## 2014-09-28 DIAGNOSIS — Z7982 Long term (current) use of aspirin: Secondary | ICD-10-CM | POA: Diagnosis not present

## 2014-09-28 DIAGNOSIS — E785 Hyperlipidemia, unspecified: Secondary | ICD-10-CM | POA: Diagnosis not present

## 2014-09-28 DIAGNOSIS — R079 Chest pain, unspecified: Secondary | ICD-10-CM | POA: Diagnosis present

## 2014-09-28 DIAGNOSIS — Z792 Long term (current) use of antibiotics: Secondary | ICD-10-CM | POA: Diagnosis not present

## 2014-09-28 DIAGNOSIS — Z8739 Personal history of other diseases of the musculoskeletal system and connective tissue: Secondary | ICD-10-CM | POA: Diagnosis not present

## 2014-09-28 DIAGNOSIS — K219 Gastro-esophageal reflux disease without esophagitis: Secondary | ICD-10-CM | POA: Diagnosis not present

## 2014-09-28 DIAGNOSIS — J45909 Unspecified asthma, uncomplicated: Secondary | ICD-10-CM | POA: Insufficient documentation

## 2014-09-28 DIAGNOSIS — I10 Essential (primary) hypertension: Secondary | ICD-10-CM | POA: Diagnosis not present

## 2014-09-28 DIAGNOSIS — Z79899 Other long term (current) drug therapy: Secondary | ICD-10-CM | POA: Diagnosis not present

## 2014-09-28 DIAGNOSIS — Z8742 Personal history of other diseases of the female genital tract: Secondary | ICD-10-CM | POA: Diagnosis not present

## 2014-09-28 LAB — CBC
HCT: 35 % — ABNORMAL LOW (ref 36.0–46.0)
Hemoglobin: 11.8 g/dL — ABNORMAL LOW (ref 12.0–15.0)
MCH: 30.5 pg (ref 26.0–34.0)
MCHC: 33.7 g/dL (ref 30.0–36.0)
MCV: 90.4 fL (ref 78.0–100.0)
Platelets: 254 10*3/uL (ref 150–400)
RBC: 3.87 MIL/uL (ref 3.87–5.11)
RDW: 12.7 % (ref 11.5–15.5)
WBC: 6.5 10*3/uL (ref 4.0–10.5)

## 2014-09-28 LAB — BASIC METABOLIC PANEL
Anion gap: 12 (ref 5–15)
BUN: 18 mg/dL (ref 6–20)
CO2: 29 mmol/L (ref 22–32)
Calcium: 9.3 mg/dL (ref 8.9–10.3)
Chloride: 100 mmol/L — ABNORMAL LOW (ref 101–111)
Creatinine, Ser: 0.82 mg/dL (ref 0.44–1.00)
GFR calc Af Amer: >60 mL/min (ref >60)
GFR calc non Af Amer: >60 mL/min (ref >60)
Glucose, Bld: 136 mg/dL — ABNORMAL HIGH (ref 65–99)
Potassium: 3.3 mmol/L — ABNORMAL LOW (ref 3.5–5.1)
Sodium: 141 mmol/L (ref 135–145)

## 2014-09-28 LAB — I-STAT TROPONIN, ED: Troponin i, poc: 0.01 ng/mL (ref 0.00–0.08)

## 2014-09-28 MED ORDER — NITROGLYCERIN 0.4 MG SL SUBL
0.4000 mg | SUBLINGUAL_TABLET | SUBLINGUAL | Status: DC | PRN
  Filled 2014-09-28: qty 1

## 2014-09-28 NOTE — ED Provider Notes (Signed)
CSN: [id number]     Arrival date & time 09/28/14  1919 History   First MD Initiated Contact with Patient 09/28/14 1934     Chief Complaint  Patient presents with  . Chest Pain     (Consider location/radiation/quality/duration/timing/severity/associated sxs/prior Treatment) HPI   64 year old female with history of non-insulin dependent diabetes, hypertension, GERD, hyperlipidemia who presented with complaints of chest pain. Patient reports she has been experiencing intermittent left upper chest pain throughout the day today. Pain started around 6:30 AM after she got up. She describes pain as a dull and achy sensation, lasting 20-30 minutes, waxing and waning but present throughout the day. Pain is nonradiating, and there is no associated exertional pain, lightheadedness, dizziness, diaphoresis, or shortness of breath. She did try taking 2 aspirin and went to sleep however pain was still present when she woke up. She went to church and did have some intermittent pain at that time. The pain is currently 2 out of 10. No complaints of fever, chills, headache, neck pain, back pain, productive cough, hemoptysis, abdominal pain, nausea vomiting diarrhea, focal numbness or weakness, or rash. She recall a similar episode 20 years ago when she had a full cardiac evaluation and subsequently told that it was muscle related. She denies any recent strenuous activity. She did some yard work a week ago but none recently. Patient also mentioned that she had a regular checkup with her primary care provider this past week and aside from mildly elevated cholesterol level everything else seems to be normal.  Past Medical History  Diagnosis Date  . Hypertension   . S/P hysterectomy   . GERD (gastroesophageal reflux disease)   . Hyperlipidemia   . Fibrocystic breast   . Asthma     as child and seasonal  . Neuromuscular disorder     tennis elbow on right and left arm  . Diabetes mellitus without complication     Past Surgical History  Procedure Laterality Date  . Cesarean section    . Breast surgery      biopsy rt breast-1960- benign.Left breast 1975  - lumpectomy - benign  . Abdominal hysterectomy      2003- still has left ovary  . Trigger finger release  02/16/2012    Procedure: RELEASE TRIGGER FINGER/A-1 PULLEY;  Surgeon: Wyn Forster., MD;  Location: [facility];  Service: Orthopedics;  Laterality: Right;  RIGHT RING FINGER    Family History  Problem Relation Age of Onset  . Stroke Father   . Asthma Other   . Hypertension Other   . Cancer Other   . Diabetes Other   . Hyperlipidemia Other    History  Substance Use Topics  . Smoking status: Never Smoker   . Smokeless tobacco: Never Used  . Alcohol Use: No   OB History    No data available     Review of Systems  All other systems reviewed and are negative.     Allergies  Tramadol and Percocet  Home Medications   Prior to Admission medications   Medication Sig Start Date End Date Taking? Authorizing Provider  albuterol (PROVENTIL HFA;VENTOLIN HFA) 108 (90 BASE) MCG/ACT inhaler Inhale 2 puffs into the lungs every 6 (six) hours as needed for wheezing or shortness of breath.    Historical Provider, MD  aspirin 81 MG tablet Take 81 mg by mouth daily.    Historical Provider, MD  BAYER CONTOUR TEST test strip USE AS DIRECTED 11/08/13   Luanna Cole  Baxley, MD  cholecalciferol (VITAMIN D) 1000 UNITS tablet Take 1,000 Units by mouth 2 (two) times daily.    Historical Provider, MD  clonazePAM (KLONOPIN) 0.5 MG tablet TAKE ONE TABLET BY MOUTH EVERY DAY AT BEDTIME 01/10/14   [name], MD  CRESTOR 20 MG tablet TAKE ONE TABLET BY MOUTH ONCE DAILY 11/29/13   [name], MD  cyclobenzaprine (FLEXERIL) 10 MG tablet Take 1 tablet (10 mg total) by mouth at bedtime. Patient taking differently: Take 10 mg by mouth at bedtime as needed for muscle spasms.  04/23/14   [name], MD  FINGERSTIX LANCETS MISC Check blood  sugar 4-5 times daily. She has a [employer] 01/20/14   [name], MD  glipiZIDE (GLUCOTROL XL) 10 MG 24 hr tablet TAKE ONE TABLET BY MOUTH ONCE DAILY WITH BREAKFAST 07/22/14   [name], MD  HYDROcodone-acetaminophen (NORCO) 5-325 MG per tablet Take 1 tablet by mouth 2 (two) times daily as needed for moderate pain. 08/27/14   [name], MD  KLOR-CON M20 20 MEQ tablet TAKE TWO TABLETS BY MOUTH ONCE DAILY    [name], MD  metFORMIN (GLUCOPHAGE) 500 MG tablet Take 500 mg by mouth 2 (two) times daily with a meal.     Historical Provider, MD  Multiple Vitamin (MULTIVITAMIN WITH MINERALS) TABS tablet Take 1 tablet by mouth daily.    Historical Provider, MD  mupirocin cream (BACTROBAN) 2 % Apply 1 application topically 2 (two) times daily. 04/17/14   Elson Areas, PA-C  omeprazole (PRILOSEC) 20 MG capsule Take 20 mg by mouth daily as needed (heart burn).     Historical Provider, MD  promethazine (PHENERGAN) 25 MG tablet Take 1 tablet (25 mg total) by mouth every 8 (eight) hours as needed for nausea or vomiting. 10/29/13   [name], MD  triamcinolone cream (KENALOG) 0.1 % Apply 1 application topically 3 (three) times daily. 04/24/14   [name], MD  valACYclovir (VALTREX) 1000 MG tablet Take 1 tablet (1,000 mg total) by mouth 3 (three) times daily. 04/24/14   [name], MD  valsartan-hydrochlorothiazide (DIOVAN-HCT) 80-12.5 MG per tablet Take 1 tablet by mouth daily. 04/23/14   [name], MD   BP 148/78 mmHg  Pulse 78  Temp(Src) 98.2 F (36.8 C) (Oral)  Resp 20  Ht  (1.575 m)  Wt 198 lb (89.812 kg)  BMI 36.21 kg/m2  SpO2 99% Physical Exam  Constitutional: She is oriented to person, place, and time. She appears well-developed and well-nourished. No distress.  HENT:  Head: Atraumatic.  Eyes: Conjunctivae are normal.  Neck: Neck supple.  Cardiovascular: Normal rate, regular rhythm and intact distal pulses.   Pulmonary/Chest: Effort normal and breath sounds  normal.  Abdominal: Soft. She exhibits no distension. There is no tenderness.  Musculoskeletal: She exhibits no edema.  Neurological: She is alert and oriented to person, place, and time.  Skin: No rash noted.  Psychiatric: She has a normal mood and affect.  Nursing note and vitals reviewed.   ED Course  Procedures (including critical care time)  Patient presents with atypical chest pain. However with risk factors, her heart score is 3.    Work up today has been reassuring.  Care discussed with Dr. Hyacinth Meeker who evaluated pt and agrees with plan to have pt f/u closely with PCP and cardiology for further workout outpt.  Her work up currently is unremarkable.  Pt voice understanding and agrees  with plan.  Return precaution discussed.    Labs Review Labs Reviewed  BASIC METABOLIC PANEL - Abnormal; Notable for the following:    Potassium 3.3 (*)    Chloride 100 (*)    Glucose, Bld 136 (*)    All other components within normal limits  CBC - Abnormal; Notable for the following:    Hemoglobin 11.8 (*)    HCT 35.0 (*)    All other components within normal limits  Rosezena Sensor, ED    Imaging Review Dg Chest 2 View  09/28/2014   CLINICAL DATA:  Acute onset of generalized chest pain. Initial encounter.  EXAM: CHEST  2 VIEW  COMPARISON:  Chest radiograph performed 10/25/2013  FINDINGS: The lungs are well-aerated. Minimal left basilar atelectasis is noted. There is no evidence of pleural effusion or pneumothorax.  The heart is normal in size; the mediastinal contour is within normal limits. No acute osseous abnormalities are seen.  IMPRESSION: Minimal left basilar atelectasis noted; lungs otherwise clear.   Electronically Signed   By: Roanna Raider M.D.   On: 09/28/2014 20:37     EKG Interpretation None      Date: 09/28/2014  Rate: 78  Rhythm: normal sinus rhythm  QRS Axis: normal  Intervals: normal  ST/T Wave abnormalities: normal  Conduction Disutrbances: none  Narrative  Interpretation: Borderline T wave abnormalities  Old EKG Reviewed: No significant changes noted EKG reviewed by me.     MDM   Final diagnoses:  Chest pain    BP 147/79 mmHg  Pulse 71  Temp(Src) 98.2 F (36.8 C) (Oral)  Resp 17  Ht 5\' 2"  (1.575 m)  Wt 198 lb (89.812 kg)  BMI 36.21 kg/m2  SpO2 98%  I have reviewed nursing notes and vital signs. I personally reviewed the imaging tests through PACS system  I reviewed available ER/hospitalization records thought the EMR     Fayrene Helper, Cordelia Poche 09/28/14 2119  Eber Hong, MD 09/29/14 1214

## 2014-09-28 NOTE — Discharge Instructions (Signed)
Please follow up with your doctor and with cardiologist in the next 48 hrs for further evaluation of your chest discomfort.  Return to ER if your symptoms worsen or if you have other concerns.    Chest Pain (Nonspecific) It is often hard to give a specific diagnosis for the cause of chest pain. There is always a chance that your pain could be related to something serious, such as a heart attack or a blood clot in the lungs. You need to follow up with your health care provider for further evaluation. CAUSES   Heartburn.  Pneumonia or bronchitis.  Anxiety or stress.  Inflammation around your heart (pericarditis) or lung (pleuritis or pleurisy).  A blood clot in the lung.  A collapsed lung (pneumothorax). It can develop suddenly on its own (spontaneous pneumothorax) or from trauma to the chest.  Shingles infection (herpes zoster virus). The chest wall is composed of bones, muscles, and cartilage. Any of these can be the source of the pain.  The bones can be bruised by injury.  The muscles or cartilage can be strained by coughing or overwork.  The cartilage can be affected by inflammation and become sore (costochondritis). DIAGNOSIS  Lab tests or other studies may be needed to find the cause of your pain. Your health care provider may have you take a test called an ambulatory electrocardiogram (ECG). An ECG records your heartbeat patterns over a 24-hour period. You may also have other tests, such as:  Transthoracic echocardiogram (TTE). During echocardiography, sound waves are used to evaluate how blood flows through your heart.  Transesophageal echocardiogram (TEE).  Cardiac monitoring. This allows your health care provider to monitor your heart rate and rhythm in real time.  Holter monitor. This is a portable device that records your heartbeat and can help diagnose heart arrhythmias. It allows your health care provider to track your heart activity for several days, if  needed.  Stress tests by exercise or by giving medicine that makes the heart beat faster. TREATMENT   Treatment depends on what may be causing your chest pain. Treatment may include:  Acid blockers for heartburn.  Anti-inflammatory medicine.  Pain medicine for inflammatory conditions.  Antibiotics if an infection is present.  You may be advised to change lifestyle habits. This includes stopping smoking and avoiding alcohol, caffeine, and chocolate.  You may be advised to keep your head raised (elevated) when sleeping. This reduces the chance of acid going backward from your stomach into your esophagus. Most of the time, nonspecific chest pain will improve within 2-3 days with rest and mild pain medicine.  HOME CARE INSTRUCTIONS   If antibiotics were prescribed, take them as directed. Finish them even if you start to feel better.  For the next few days, avoid physical activities that bring on chest pain. Continue physical activities as directed.  Do not use any tobacco products, including cigarettes, chewing tobacco, or electronic cigarettes.  Avoid drinking alcohol.  Only take medicine as directed by your health care provider.  Follow your health care provider's suggestions for further testing if your chest pain does not go away.  Keep any follow-up appointments you made. If you do not go to an appointment, you could develop lasting (chronic) problems with pain. If there is any problem keeping an appointment, call to reschedule. SEEK MEDICAL CARE IF:   Your chest pain does not go away, even after treatment.  You have a rash with blisters on your chest.  You have a fever. SEEK  IMMEDIATE MEDICAL CARE IF:   You have increased chest pain or pain that spreads to your arm, neck, jaw, back, or abdomen.  You have shortness of breath.  You have an increasing cough, or you cough up blood.  You have severe back or abdominal pain.  You feel nauseous or vomit.  You have severe  weakness.  You faint.  You have chills. This is an emergency. Do not wait to see if the pain will go away. Get medical help at once. Call your local emergency services (911 in [place] Do not drive yourself to the hospital. MAKE SURE YOU:   Understand these instructions.  Will watch your condition.  Will get help right away if you are not doing well or get worse. Document Released: 01/12/2005 Document Revised: 04/09/2013 Document Reviewed: 11/08/2007 [facility] Patient Information 2015 [place], [place]. This information is not intended to replace advice given to you by your health care provider. Make sure you discuss any questions you have with your health care provider.

## 2014-09-28 NOTE — ED Notes (Signed)
Pt alert, oriented, and ambulatory upon DC. She was advised to follow up with PCP and Cardiology in 2 days.  Wheelchair offered to lobby however, patient declines. Pt husband driving her home.

## 2014-09-28 NOTE — ED Notes (Signed)
Pt began having left sided CP this am.  Pain subsided after taking 2 aspirin and laying down but returned when she got back up. No increase in pain with movement.  Pain is intermittent and it is an aching/squeezing pain.  No sob, n/v or diaphoresis with this.

## 2014-09-28 NOTE — ED Provider Notes (Signed)
The patient is a 64 year old female, known to have high blood pressure and diabetes, she had a normal stress test approximately 3 years ago when a normal heart catheterization more than 10 years ago, never has chest pain, today she has had several episodes of chest pain that started at 6:30 this morning, it is a throbbing in her left upper chest that last for less than 1 minute and has been present several times today. It is not related to exertion or position, no trouble with deep breathing, no coughing fevers chills nausea vomiting swelling of the legs. Her EKG is better than her last EKG, troponin is normal, she is low risk from a heart disease standpoint both in that her story does not sound cardiac and her heart score of 3. She will need close follow-up in the outpatient setting, she is agreeable to this plan.  Medical screening examination/treatment/procedure(s) were conducted as a shared visit with non-physician practitioner(s) and myself.  I personally evaluated the patient during the encounter.  Clinical Impression:   Final diagnoses:  Chest pain     EKG Interpretation  Date/Time:  Sunday September 28 2014 19:26:05 EDT Ventricular Rate:  78 PR Interval:  152 QRS Duration: 74 QT Interval:  312 QTC Calculation: 355 R Axis:   39 Text Interpretation:  Sinus rhythm Borderline T wave abnormalities since last tracing no significant change Abnormal ekg Confirmed by Hyacinth Meeker  MD, Reagen Haberman ([id number]) on 09/28/2014 8:26:22 PM        Eber Hong, MD 09/29/14 1214

## 2014-10-13 ENCOUNTER — Other Ambulatory Visit: Payer: Self-pay

## 2014-10-16 ENCOUNTER — Encounter: Payer: Self-pay | Admitting: Internal Medicine

## 2014-10-16 ENCOUNTER — Ambulatory Visit
Admission: RE | Admit: 2014-10-16 | Discharge: 2014-10-16 | Disposition: A | Discharge status: Home / Self Care | Payer: BC Managed Care – PPO | Source: Ambulatory Visit | Attending: Internal Medicine | Admitting: Internal Medicine

## 2014-10-16 ENCOUNTER — Ambulatory Visit (INDEPENDENT_AMBULATORY_CARE_PROVIDER_SITE_OTHER): Payer: BC Managed Care – PPO | Admitting: Internal Medicine

## 2014-10-16 ENCOUNTER — Telehealth: Payer: Self-pay | Admitting: *Deleted

## 2014-10-16 VITALS — BP 126/76 | HR 71 | Temp 97.4°F | Wt 196.0 lb

## 2014-10-16 DIAGNOSIS — R05 Cough: Secondary | ICD-10-CM

## 2014-10-16 DIAGNOSIS — R059 Cough, unspecified: Secondary | ICD-10-CM

## 2014-10-16 DIAGNOSIS — R6883 Chills (without fever): Secondary | ICD-10-CM

## 2014-10-16 LAB — CBC WITH DIFFERENTIAL/PLATELET
Basophils Absolute: 0 10*3/uL (ref 0.0–0.1)
Basophils Relative: 0 % (ref 0–1)
Eosinophils Absolute: 0.1 10*3/uL (ref 0.0–0.7)
Eosinophils Relative: 2 % (ref 0–5)
HCT: 35.2 % — ABNORMAL LOW (ref 36.0–46.0)
Hemoglobin: 11.9 g/dL — ABNORMAL LOW (ref 12.0–15.0)
Lymphocytes Relative: 43 % (ref 12–46)
Lymphs Abs: 2 10*3/uL (ref 0.7–4.0)
MCH: 30.6 pg (ref 26.0–34.0)
MCHC: 33.8 g/dL (ref 30.0–36.0)
MCV: 90.5 fL (ref 78.0–100.0)
MPV: 9.8 fL (ref 8.6–12.4)
Monocytes Absolute: 0.5 10*3/uL (ref 0.1–1.0)
Monocytes Relative: 10 % (ref 3–12)
Neutro Abs: 2.1 10*3/uL (ref 1.7–7.7)
Neutrophils Relative %: 45 % (ref 43–77)
Platelets: 255 10*3/uL (ref 150–400)
RBC: 3.89 MIL/uL (ref 3.87–5.11)
RDW: 12.3 % (ref 11.5–15.5)
WBC: 4.6 10*3/uL (ref 4.0–10.5)

## 2014-10-16 LAB — POCT URINALYSIS DIPSTICK
Bilirubin, UA: NEGATIVE
Blood, UA: NEGATIVE
Glucose, UA: NEGATIVE
Ketones, UA: NEGATIVE
Leukocytes, UA: NEGATIVE
Nitrite, UA: NEGATIVE
Protein, UA: NEGATIVE
Spec Grav, UA: 1.01
Urobilinogen, UA: NEGATIVE
pH, UA: 6.5

## 2014-10-16 NOTE — Patient Instructions (Signed)
Continue close observation and call if any concerns. No prescriptions given today.

## 2014-10-16 NOTE — Telephone Encounter (Signed)
Left message with lab results on patient voice mail 

## 2014-10-16 NOTE — Progress Notes (Signed)
   Subjective:    Patient ID: Carla Oconnor, female    DOB: September 23, 1950, 64 y.o.   MRN: [id number]  HPI This past weekend she developed a slight cough and a dry throat. Has been having chills since that time but no documented fever. Has no significant cough at this point in time. No UTI symptoms. No headache.    Review of Systems     Objective:   Physical Exam Skin warm and dry. Nodes none. TMs and pharynx are clear. Neck is supple. Chest some coarse rhonchi noted. Chest x-ray is negative. Urinalysis is normal. CBC shows a normal white blood cell count       Assessment & Plan:  ? Viral syndrome  Plan: Patient reassured. No treatment at this point in time. Continue to observe.

## 2014-10-24 NOTE — Telephone Encounter (Signed)
Reviewed lab results with patient.

## 2014-11-24 ENCOUNTER — Other Ambulatory Visit: Payer: BC Managed Care – PPO | Admitting: Internal Medicine

## 2014-11-25 ENCOUNTER — Ambulatory Visit: Payer: BC Managed Care – PPO | Admitting: Internal Medicine

## 2014-12-01 ENCOUNTER — Other Ambulatory Visit: Payer: Self-pay | Admitting: Internal Medicine

## 2014-12-01 ENCOUNTER — Other Ambulatory Visit: Payer: Medicare PPO | Admitting: Internal Medicine

## 2014-12-01 DIAGNOSIS — E785 Hyperlipidemia, unspecified: Secondary | ICD-10-CM

## 2014-12-01 LAB — LIPID PANEL
Cholesterol: 152 mg/dL (ref 125–200)
HDL: 58 mg/dL (ref >=46)
LDL Cholesterol: 75 mg/dL (ref <130)
Total CHOL/HDL Ratio: 2.6 Ratio (ref <=5.0)
Triglycerides: 97 mg/dL (ref <150)
VLDL: 19 mg/dL (ref <30)

## 2014-12-02 ENCOUNTER — Encounter: Payer: Self-pay | Admitting: Internal Medicine

## 2014-12-02 ENCOUNTER — Ambulatory Visit (INDEPENDENT_AMBULATORY_CARE_PROVIDER_SITE_OTHER): Payer: Medicare PPO | Admitting: Internal Medicine

## 2014-12-02 VITALS — BP 132/68 | HR 76 | Temp 97.6°F | Wt 189.0 lb

## 2014-12-02 DIAGNOSIS — E119 Type 2 diabetes mellitus without complications: Secondary | ICD-10-CM | POA: Diagnosis not present

## 2014-12-02 DIAGNOSIS — E8881 Metabolic syndrome: Secondary | ICD-10-CM

## 2014-12-02 DIAGNOSIS — E785 Hyperlipidemia, unspecified: Secondary | ICD-10-CM | POA: Diagnosis not present

## 2014-12-02 DIAGNOSIS — I10 Essential (primary) hypertension: Secondary | ICD-10-CM

## 2014-12-02 DIAGNOSIS — E669 Obesity, unspecified: Secondary | ICD-10-CM | POA: Diagnosis not present

## 2014-12-02 LAB — HEMOGLOBIN A1C
Hgb A1c MFr Bld: 6.2 % — ABNORMAL HIGH (ref <5.7)
Mean Plasma Glucose: 131 mg/dL — ABNORMAL HIGH (ref <117)

## 2014-12-02 MED ORDER — HYDROCODONE-ACETAMINOPHEN 5-325 MG PO TABS: 1.0000 | ORAL_TABLET | Freq: Two times a day (BID) | ORAL | Status: DC | PRN

## 2014-12-02 NOTE — Progress Notes (Signed)
   Subjective:    Patient ID: Carla Oconnor, female    DOB: 29-Oct-1950, 64 y.o.   MRN: [id number]  HPI Says Accu-Cheks of been stable. Will and hemoglobin A1c. Lipid panel  shows considerable improvement from last reading. She's been doing diet and exercise only. Not taking statin medication. Doing water aerobics several times weekly and walking. Eating better. I suspecting globin A1c will have improved as well. Lipid panel is now normal Has lost 9 pounds since last visit.  She is requesting refill on Norco for musculoskeletal pain. Given #60 with no refill. Last refill was in May. Review of Systems     Objective:   Physical Exam Neck is supple without JVD thyromegaly carotid bruits. Chest clear to auscultation. Cardiac exam regular rate and rhythm normal S1 and S2. Extremity without edema. Blood pressure is normal.       Assessment & Plan:  Essential hypertension-stable  Hyperlipidemia-markedly improved with diet and exercise alone  Type 2 diabetes-in globin A1c to be added  Metabolic syndrome  Obesity-continue diet and exercise efforts  Plan: Physical exam due in December

## 2014-12-02 NOTE — Patient Instructions (Signed)
Take Norco sparingly for pain. Continue same medications and diet and exercise efforts. Return in December.

## 2014-12-03 ENCOUNTER — Telehealth: Payer: Self-pay | Admitting: *Deleted

## 2014-12-03 NOTE — Telephone Encounter (Signed)
Reviewed HgbA1C results with patient

## 2015-01-07 ENCOUNTER — Other Ambulatory Visit: Payer: Self-pay | Admitting: *Deleted

## 2015-01-07 MED ORDER — METFORMIN HCL 1000 MG PO TABS: 1000.0000 mg | ORAL_TABLET | Freq: Two times a day (BID) | ORAL | Status: DC

## 2015-01-29 ENCOUNTER — Other Ambulatory Visit: Payer: Self-pay | Admitting: *Deleted

## 2015-01-29 MED ORDER — CLONAZEPAM 0.5 MG PO TABS: 0.5000 mg | ORAL_TABLET | Freq: Every day | ORAL | Status: DC

## 2015-01-29 NOTE — Telephone Encounter (Signed)
Klonopin Rx called to pharmacy

## 2015-02-11 ENCOUNTER — Other Ambulatory Visit: Payer: Self-pay

## 2015-02-11 DIAGNOSIS — N6310 Unspecified lump in the right breast, unspecified quadrant: Secondary | ICD-10-CM

## 2015-03-02 ENCOUNTER — Other Ambulatory Visit: Payer: Self-pay

## 2015-03-02 DIAGNOSIS — N644 Mastodynia: Secondary | ICD-10-CM

## 2015-03-05 ENCOUNTER — Ambulatory Visit
Admission: RE | Admit: 2015-03-05 | Discharge: 2015-03-05 | Disposition: A | Discharge status: Home / Self Care | Payer: Medicare PPO | Source: Ambulatory Visit

## 2015-03-05 DIAGNOSIS — N644 Mastodynia: Secondary | ICD-10-CM

## 2015-03-24 ENCOUNTER — Other Ambulatory Visit: Payer: Medicare PPO | Admitting: Internal Medicine

## 2015-03-24 DIAGNOSIS — E785 Hyperlipidemia, unspecified: Secondary | ICD-10-CM

## 2015-03-24 DIAGNOSIS — I10 Essential (primary) hypertension: Secondary | ICD-10-CM

## 2015-03-24 DIAGNOSIS — E8881 Metabolic syndrome: Secondary | ICD-10-CM

## 2015-03-24 DIAGNOSIS — Z0000 Encounter for general adult medical examination without abnormal findings: Secondary | ICD-10-CM

## 2015-03-24 DIAGNOSIS — Z79899 Other long term (current) drug therapy: Secondary | ICD-10-CM

## 2015-03-24 DIAGNOSIS — E669 Obesity, unspecified: Secondary | ICD-10-CM

## 2015-03-24 DIAGNOSIS — E119 Type 2 diabetes mellitus without complications: Secondary | ICD-10-CM

## 2015-03-24 LAB — COMPLETE METABOLIC PANEL WITH GFR
ALT: 12 U/L (ref 6–29)
AST: 16 U/L (ref 10–35)
Albumin: 4.1 g/dL (ref 3.6–5.1)
Alkaline Phosphatase: 56 U/L (ref 33–130)
BUN: 15 mg/dL (ref 7–25)
CO2: 31 mmol/L (ref 20–31)
Calcium: 8.9 mg/dL (ref 8.6–10.4)
Chloride: 102 mmol/L (ref 98–110)
Creat: 0.88 mg/dL (ref 0.50–0.99)
GFR, Est African American: 80 mL/min (ref >=60)
GFR, Est Non African American: 69 mL/min (ref >=60)
Glucose, Bld: 108 mg/dL — ABNORMAL HIGH (ref 65–99)
Potassium: 4 mmol/L (ref 3.5–5.3)
Sodium: 142 mmol/L (ref 135–146)
Total Bilirubin: 0.5 mg/dL (ref 0.2–1.2)
Total Protein: 6.4 g/dL (ref 6.1–8.1)

## 2015-03-24 LAB — CBC WITH DIFFERENTIAL/PLATELET
Basophils Absolute: 0 10*3/uL (ref 0.0–0.1)
Basophils Relative: 1 % (ref 0–1)
Eosinophils Absolute: 0.1 10*3/uL (ref 0.0–0.7)
Eosinophils Relative: 3 % (ref 0–5)
HCT: 36.7 % (ref 36.0–46.0)
Hemoglobin: 12.1 g/dL (ref 12.0–15.0)
Lymphocytes Relative: 46 % (ref 12–46)
Lymphs Abs: 2 10*3/uL (ref 0.7–4.0)
MCH: 29.6 pg (ref 26.0–34.0)
MCHC: 33 g/dL (ref 30.0–36.0)
MCV: 89.7 fL (ref 78.0–100.0)
MPV: 9.3 fL (ref 8.6–12.4)
Monocytes Absolute: 0.4 10*3/uL (ref 0.1–1.0)
Monocytes Relative: 10 % (ref 3–12)
Neutro Abs: 1.8 10*3/uL (ref 1.7–7.7)
Neutrophils Relative %: 40 % — ABNORMAL LOW (ref 43–77)
Platelets: 275 10*3/uL (ref 150–400)
RBC: 4.09 MIL/uL (ref 3.87–5.11)
RDW: 13.9 % (ref 11.5–15.5)
WBC: 4.4 10*3/uL (ref 4.0–10.5)

## 2015-03-24 LAB — LIPID PANEL
Cholesterol: 231 mg/dL — ABNORMAL HIGH (ref 125–200)
HDL: 47 mg/dL (ref >=46)
LDL Cholesterol: 142 mg/dL — ABNORMAL HIGH (ref <130)
Total CHOL/HDL Ratio: 4.9 Ratio (ref <=5.0)
Triglycerides: 210 mg/dL — ABNORMAL HIGH (ref <150)
VLDL: 42 mg/dL — ABNORMAL HIGH (ref <30)

## 2015-03-24 LAB — TSH: TSH: 0.949 u[IU]/mL (ref 0.350–4.500)

## 2015-03-24 LAB — HEMOGLOBIN A1C
Hgb A1c MFr Bld: 6.6 % — ABNORMAL HIGH (ref <5.7)
Mean Plasma Glucose: 143 mg/dL — ABNORMAL HIGH (ref <117)

## 2015-03-25 LAB — VITAMIN D 25 HYDROXY (VIT D DEFICIENCY, FRACTURES): Vit D, 25-Hydroxy: 35 ng/mL (ref 30–100)

## 2015-03-26 ENCOUNTER — Ambulatory Visit (INDEPENDENT_AMBULATORY_CARE_PROVIDER_SITE_OTHER): Payer: Medicare PPO | Admitting: Internal Medicine

## 2015-03-26 ENCOUNTER — Encounter: Payer: Self-pay | Admitting: Internal Medicine

## 2015-03-26 VITALS — BP 144/76 | HR 86 | Temp 97.2°F | Resp 20 | Ht 62.25 in | Wt 193.0 lb

## 2015-03-26 DIAGNOSIS — I10 Essential (primary) hypertension: Secondary | ICD-10-CM | POA: Diagnosis not present

## 2015-03-26 DIAGNOSIS — E785 Hyperlipidemia, unspecified: Secondary | ICD-10-CM

## 2015-03-26 DIAGNOSIS — E669 Obesity, unspecified: Secondary | ICD-10-CM

## 2015-03-26 DIAGNOSIS — M79671 Pain in right foot: Secondary | ICD-10-CM | POA: Diagnosis not present

## 2015-03-26 DIAGNOSIS — R319 Hematuria, unspecified: Secondary | ICD-10-CM | POA: Diagnosis not present

## 2015-03-26 DIAGNOSIS — R030 Elevated blood-pressure reading, without diagnosis of hypertension: Secondary | ICD-10-CM | POA: Diagnosis not present

## 2015-03-26 DIAGNOSIS — K219 Gastro-esophageal reflux disease without esophagitis: Secondary | ICD-10-CM

## 2015-03-26 DIAGNOSIS — IMO0001 Reserved for inherently not codable concepts without codable children: Secondary | ICD-10-CM

## 2015-03-26 DIAGNOSIS — Z0000 Encounter for general adult medical examination without abnormal findings: Secondary | ICD-10-CM

## 2015-03-26 DIAGNOSIS — E119 Type 2 diabetes mellitus without complications: Secondary | ICD-10-CM

## 2015-03-26 DIAGNOSIS — Z23 Encounter for immunization: Secondary | ICD-10-CM

## 2015-03-26 LAB — URINALYSIS, ROUTINE W REFLEX MICROSCOPIC
Bilirubin Urine: NEGATIVE
Glucose, UA: NEGATIVE
Hgb urine dipstick: NEGATIVE
Ketones, ur: NEGATIVE
Nitrite: NEGATIVE
Protein, ur: NEGATIVE
Specific Gravity, Urine: 1.022 (ref 1.001–1.035)
pH: 5.5 (ref 5.0–8.0)

## 2015-03-26 LAB — POCT URINALYSIS DIPSTICK
Bilirubin, UA: NEGATIVE
Glucose, UA: NEGATIVE
Ketones, UA: NEGATIVE
Leukocytes, UA: NEGATIVE
Nitrite, UA: NEGATIVE
Protein, UA: NEGATIVE
Spec Grav, UA: 1.02
Urobilinogen, UA: 0.2
pH, UA: 6

## 2015-03-26 NOTE — Patient Instructions (Addendum)
Patient to have diabetic eye exam in January. Return in 4 weeks for office visit blood pressure check and Prevnar. Flu vaccine given today. Please call Dr. Renae FicklePaul for appointment regarding right foot pain which has been chronic for year.

## 2015-03-27 LAB — URINALYSIS, MICROSCOPIC ONLY
Bacteria, UA: NONE SEEN [HPF]
Casts: NONE SEEN [LPF]
Crystals: NONE SEEN [HPF]
Squamous Epithelial / LPF: NONE SEEN [HPF] (ref <=5)
Yeast: NONE SEEN [HPF]

## 2015-03-27 LAB — MICROALBUMIN, URINE: Microalb, Ur: 1 mg/dL

## 2015-03-31 ENCOUNTER — Other Ambulatory Visit: Payer: Self-pay | Admitting: Internal Medicine

## 2015-04-02 ENCOUNTER — Other Ambulatory Visit: Payer: Self-pay

## 2015-04-02 MED ORDER — METFORMIN HCL 1000 MG PO TABS: 1000.0000 mg | ORAL_TABLET | Freq: Two times a day (BID) | ORAL | Status: DC

## 2015-04-02 MED ORDER — ROSUVASTATIN CALCIUM 20 MG PO TABS: 20.0000 mg | ORAL_TABLET | Freq: Every day | ORAL | Status: DC

## 2015-04-02 MED ORDER — VALSARTAN-HYDROCHLOROTHIAZIDE 80-12.5 MG PO TABS: 1.0000 | ORAL_TABLET | Freq: Every day | ORAL | Status: DC

## 2015-04-02 MED ORDER — POTASSIUM CHLORIDE CRYS ER 20 MEQ PO TBCR: 40.0000 meq | EXTENDED_RELEASE_TABLET | Freq: Every day | ORAL | Status: DC

## 2015-04-02 MED ORDER — GLIPIZIDE ER 10 MG PO TB24: 10.0000 mg | ORAL_TABLET | Freq: Every day | ORAL | Status: DC

## 2015-04-18 NOTE — Progress Notes (Signed)
Subjective:    Patient ID: Carla Oconnor, female    DOB: [birth year], 64 y.o.   MRN: [id number]  HPI 64 year old Black Female in today for Welcome to Medicare physical examination. She has a history of controlled type 2 diabetes mellitus, hyperlipidemia, essential hypertension, GE reflux and history of allergic rhinitis. History of fibrocystic breast disease and asthma.  Drug intolerances: Prevacid causes hives. Patient says ibuprofen causes palpitations.  Eye exams are done by Dr. Nile Riggs and are due in the Fall of each year.  Past medical history: History of right oophorectomy in 2003. Dr. Duane Lope is GYN physician. History of left lateral epicondylitis 2013. Carpal tunnel release right hand October 2013 by Dr. Teressa Senter. Cesarean sections 1975 and 1977. Benign right breast biopsy done in 1966 and 1976. Right trigger finger release September 2009. Right third trigger finger treated July 2007.  Social history: She is married. Has one set of twins a boy and a girl in their 30s. She also has another son. 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 She has never smoked. No alcohol consumption. Husband is a English as a second language teacher.  Family history: Father died at age 14 with a stroke. Mother with history of diabetes and hypertension. One brother in fairly good health who has had some trouble with crack addiction. Total of 4 sisters. She has 2 whole sisters and 2 half-sisters. One sister died of kidney failure and breast cancer. One sister with history of diabetes and coronary artery disease.  In the Spring of 2014 she was seen by Cardiologist regarding chest pain. She had a Cardiolite study which was negative. She also had a previous Cardiolite study in 2009 that was negative. History of cardiac catheterization by Dr. Aleen Campi in 2003.    Review of Systems  Constitutional: Negative.   Respiratory: Negative.   Cardiovascular: Negative.   Gastrointestinal: Negative.     Genitourinary: Negative.   Musculoskeletal:       Chronic right foot pain for the past year. Needs to see orthopedist.   history of anxiety, otherwise negative     Objective:   Physical Exam  Constitutional: She is oriented to person, place, and time. She appears well-developed and well-nourished. No distress.  HENT:  Head: Normocephalic and atraumatic.  Right Ear: External ear normal.  Left Ear: External ear normal.  Mouth/Throat: Oropharynx is clear and moist. No oropharyngeal exudate.  Eyes: Conjunctivae and EOM are normal. Pupils are equal, round, and reactive to light. Right eye exhibits no discharge. Left eye exhibits no discharge. No scleral icterus.  Neck: Neck supple. No JVD present. No thyromegaly present.  Cardiovascular: Normal rate and normal heart sounds.   No murmur heard. Pulmonary/Chest: Effort normal and breath sounds normal. No respiratory distress. She has no wheezes. She has no rales.  Breasts normal female without masses  Abdominal: Soft. Bowel sounds are normal. There is no tenderness. There is no rebound and no guarding.  Genitourinary:  Deferred to  GYN physician  Musculoskeletal: She exhibits no edema.  Lymphadenopathy:    She has no cervical adenopathy.  Neurological: She is alert and oriented to person, place, and time. She has normal reflexes. No cranial nerve deficit. Coordination normal.  Skin: Skin is warm and dry. No rash noted. She is not diaphoretic.  Psychiatric: She has a normal mood and affect. Her behavior is normal. Judgment and thought content normal.  Vitals reviewed.         Assessment & Plan:  Essential hypertension  GE reflux  Asthma  Allergic rhinitis  Fibrocystic breast disease  Controlled type 2 diabetes mellitus. Hemoglobin A1c 6.7%. Encouraged diet exercise and weight loss.  Hyperlipidemia. Significant increase in total cholesterol and triglycerides from last measurement. Needs to work on diet and exercise.  Right  foot pain-need to see orthopedist. This is been present for a year and x-ray was negative.  Elevated blood pressure-return in 4 weeks for recheck.  Subjective:   Patient presents for Medicare Annual/Subsequent preventive examination.  Review Past Medical/Family/Social: See above   Risk Factors  Current exercise habits: Some light exercise Dietary issues discussed: Low fat low carbohydrate  Cardiac risk factors: Essential hypertension, family history, diabetes mellitus, hyperlipidemia  Depression Screen  (Note: if answer to either of the following is "Yes", a more complete depression screening is indicated)   Over the past two weeks, have you felt down, depressed or hopeless? No  Over the past two weeks, have you felt little interest or pleasure in doing things? No Have you lost interest or pleasure in daily life? No Do you often feel hopeless? No Do you cry easily over simple problems? No   Activities of Daily Living  In your present state of health, do you have any difficulty performing the following activities?:   Driving? No  Managing money? No  Feeding yourself? No  Getting from bed to chair? No  Climbing a flight of stairs? No  Preparing food and eating?: No  Bathing or showering? No  Getting dressed: No  Getting to the toilet? No  Using the toilet:No  Moving around from place to place: No  In the past year have you fallen or had a near fall?:No  Are you sexually active? yes Do you have more than one partner? No   Hearing Difficulties: No  Do you often ask people to speak up or repeat themselves? No  Do you experience ringing or noises in your ears? No  Do you have difficulty understanding soft or whispered voices? No  Do you feel that you have a problem with memory? No Do you often misplace items? No    Home Safety:  Do you have a smoke alarm at your residence? Yes Do you have grab bars in the bathroom? Yes Do you have throw rugs in your house?  Yes   Cognitive Testing  Alert? Yes Normal Appearance?Yes  Oriented to person? Yes Place? Yes  Time? Yes  Recall of three objects? Yes  Can perform simple calculations? Yes  Displays appropriate judgment?Yes  Can read the correct time from a watch face?Yes   List the Names of Other Physician/Practitioners you currently use:  See referral list for the physicians patient is currently seeing.  Dr. Ky Barban   Review of Systems: See above   Objective:     General appearance: Appears stated age and mildly obese  Head: Normocephalic, without obvious abnormality, atraumatic  Eyes: conj clear, EOMi PEERLA  Ears: normal TM's and external ear canals both ears  Nose: Nares normal. Septum midline. Mucosa normal. No drainage or sinus tenderness.  Throat: lips, mucosa, and tongue normal; teeth and gums normal  Neck: no adenopathy, no carotid bruit, no JVD, supple, symmetrical, trachea midline and thyroid not enlarged, symmetric, no tenderness/mass/nodules  No CVA tenderness.  Lungs: clear to auscultation bilaterally  Breasts: normal appearance, no masses or tenderness Heart: regular rate and rhythm, S1, S2 normal, no murmur, click, rub or gallop  Abdomen: soft, non-tender; bowel sounds normal; no masses,  no organomegaly  Musculoskeletal: ROM normal in all joints, no crepitus, no deformity, Normal muscle strengthen. Back  is symmetric, no curvature. Skin: Skin color, texture, turgor normal. No rashes or lesions  Lymph nodes: Cervical, supraclavicular, and axillary nodes normal.  Neurologic: CN 2 -12 Normal, Normal symmetric reflexes. Normal coordination and gait  Psych: Alert & Oriented x 3, Mood appear stable.    Assessment:    Annual wellness medicare exam   Plan:    During the course of the visit the patient was educated and counseled about appropriate screening and preventive services including:   Annual mammogram  Annual diabetic eye exam  Prevnar  Annual  flu vaccine     Patient Instructions (the written plan) was given to the patient.  Medicare Attestation  I have personally reviewed:  The patient's medical and social history  Their use of alcohol, tobacco or illicit drugs  Their current medications and supplements  The patient's functional ability including ADLs,fall risks, home safety risks, cognitive, and hearing and visual impairment  Diet and physical activities  Evidence for depression or mood disorders  The patient's weight, height, BMI, and visual acuity have been recorded in the chart. I have made referrals, counseling, and provided education to the patient based on review of the above and I have provided the patient with a written personalized care plan for preventive services.

## 2015-04-23 ENCOUNTER — Encounter: Payer: Self-pay | Admitting: Internal Medicine

## 2015-04-23 ENCOUNTER — Ambulatory Visit (INDEPENDENT_AMBULATORY_CARE_PROVIDER_SITE_OTHER): Payer: Medicare Other | Admitting: Internal Medicine

## 2015-04-23 VITALS — BP 140/80 | HR 74 | Temp 98.0°F | Resp 20 | Ht 62.0 in | Wt 190.0 lb

## 2015-04-23 DIAGNOSIS — M6749 Ganglion, multiple sites: Secondary | ICD-10-CM | POA: Diagnosis not present

## 2015-04-23 DIAGNOSIS — IMO0002 Reserved for concepts with insufficient information to code with codable children: Secondary | ICD-10-CM

## 2015-04-23 DIAGNOSIS — R030 Elevated blood-pressure reading, without diagnosis of hypertension: Secondary | ICD-10-CM | POA: Diagnosis not present

## 2015-04-23 DIAGNOSIS — IMO0001 Reserved for inherently not codable concepts without codable children: Secondary | ICD-10-CM

## 2015-04-23 DIAGNOSIS — Z23 Encounter for immunization: Secondary | ICD-10-CM | POA: Diagnosis not present

## 2015-04-23 MED ORDER — VALSARTAN-HYDROCHLOROTHIAZIDE 80-12.5 MG PO TABS: 1.0000 | ORAL_TABLET | Freq: Every day | ORAL | Status: DC

## 2015-04-23 NOTE — Patient Instructions (Addendum)
RTC 4 weeks. Obtain new blood pressure medication from the drugstore.

## 2015-04-23 NOTE — Progress Notes (Signed)
   Subjective:    Patient ID: Carla Oconnor, female    DOB: 1951/02/14, 65 y.o.   MRN: [id number]  HPI She's here today to follow-up on elevated blood pressure at last visit. At last visit she was here for welcome to Medicare physical exam on December 8 and blood pressure was 144/76. Initially today was 150/74 but when I checked it it was 140/80 after resting a bit. Says that she's been told that her sister has degenerative brain disease and that there is very little that can be done for that. This is upsetting to her. She also tells me that the Diovan HCTZ that she was taking has been changed to another collar. She's changed drugstores. We've given her new prescription today. She'll follow-up with us in 4 weeks. Prevnar given today. She has a new problem regarding her left palm. She has yet to see orthopedist regarding her foot.  Review of Systems     Objective:   Physical Exam Left palm has cystic lesion between third and fourth fingers. This is a benign lesion and she was reassured. Says she noticed it when she was doing some housework.       Assessment & Plan:  Elevated blood pressure  Cystic lesion left palm  Plan: She is to return in 4 weeks for follow-up of blood pressure after starting new blood pressure medication from another drugstore. We may need to increase Diovan if blood pressure remains elevated.

## 2015-05-07 ENCOUNTER — Other Ambulatory Visit: Payer: Self-pay | Admitting: Internal Medicine

## 2015-05-26 ENCOUNTER — Encounter: Payer: Self-pay | Admitting: Internal Medicine

## 2015-05-26 ENCOUNTER — Ambulatory Visit (INDEPENDENT_AMBULATORY_CARE_PROVIDER_SITE_OTHER): Payer: Medicare Other | Admitting: Internal Medicine

## 2015-05-26 VITALS — BP 130/80 | HR 69 | Temp 97.5°F | Wt 191.0 lb

## 2015-05-26 DIAGNOSIS — E119 Type 2 diabetes mellitus without complications: Secondary | ICD-10-CM

## 2015-05-26 DIAGNOSIS — I10 Essential (primary) hypertension: Secondary | ICD-10-CM | POA: Diagnosis not present

## 2015-05-26 MED ORDER — POTASSIUM CHLORIDE CRYS ER 20 MEQ PO TBCR: 40.0000 meq | EXTENDED_RELEASE_TABLET | Freq: Every day | ORAL | Status: DC

## 2015-05-26 NOTE — Progress Notes (Signed)
   Subjective:    Patient ID: Carla Oconnor, female    DOB: 1950-12-23, 65 y.o.   MRN: [id number]  HPI Here today to follow-up on hypertension. She has changed drugstores and now is back on same generic blood pressure medicine that she had previously. Blood pressure is acceptable at 130/80. She feels well and has no new complaints. Needs order for Zostavax vaccine. Last visit blood pressure was 140/80 which was a bit high for her. She elected to change drugstores and get back on her old medication which was a different generic.    Review of Systems     Objective:   Physical Exam Not examined. Spent 15 minutes speaking with her       Assessment & Plan:  Essential hypertension-stable  Controlled Type 2 diabetes mellitus  Plan: She'll return late June for six-month recheck with fasting lipid panel and hemoglobin A1c. Did not change the dose of Diovan since blood pressure had improved.

## 2015-05-26 NOTE — Patient Instructions (Signed)
Continue same medications and return in late June for six-month follow-up

## 2015-06-21 ENCOUNTER — Other Ambulatory Visit: Payer: Self-pay | Admitting: Internal Medicine

## 2015-10-05 ENCOUNTER — Other Ambulatory Visit: Payer: Medicare Other | Admitting: Internal Medicine

## 2015-10-05 DIAGNOSIS — E785 Hyperlipidemia, unspecified: Secondary | ICD-10-CM

## 2015-10-05 DIAGNOSIS — E119 Type 2 diabetes mellitus without complications: Secondary | ICD-10-CM

## 2015-10-05 LAB — LIPID PANEL
Cholesterol: 244 mg/dL — ABNORMAL HIGH (ref 125–200)
HDL: 46 mg/dL
LDL Cholesterol: 144 mg/dL — ABNORMAL HIGH
Total CHOL/HDL Ratio: 5.3 ratio — ABNORMAL HIGH
Triglycerides: 272 mg/dL — ABNORMAL HIGH
VLDL: 54 mg/dL — ABNORMAL HIGH

## 2015-10-05 LAB — HEMOGLOBIN A1C
Hgb A1c MFr Bld: 6.2 % — ABNORMAL HIGH (ref <5.7)
Mean Plasma Glucose: 131 mg/dL

## 2015-10-06 ENCOUNTER — Encounter: Payer: Self-pay | Admitting: Internal Medicine

## 2015-10-06 ENCOUNTER — Ambulatory Visit (INDEPENDENT_AMBULATORY_CARE_PROVIDER_SITE_OTHER): Payer: Medicare Other | Admitting: Internal Medicine

## 2015-10-06 VITALS — BP 140/80 | HR 81 | Temp 97.4°F | Resp 18 | Wt 192.0 lb

## 2015-10-06 DIAGNOSIS — I10 Essential (primary) hypertension: Secondary | ICD-10-CM | POA: Diagnosis not present

## 2015-10-06 DIAGNOSIS — E785 Hyperlipidemia, unspecified: Secondary | ICD-10-CM

## 2015-10-06 DIAGNOSIS — F411 Generalized anxiety disorder: Secondary | ICD-10-CM | POA: Diagnosis not present

## 2015-10-06 DIAGNOSIS — M47816 Spondylosis without myelopathy or radiculopathy, lumbar region: Secondary | ICD-10-CM

## 2015-10-06 DIAGNOSIS — E119 Type 2 diabetes mellitus without complications: Secondary | ICD-10-CM

## 2015-10-06 MED ORDER — CLONAZEPAM 0.5 MG PO TABS: 0.5000 mg | ORAL_TABLET | Freq: Every day | ORAL | Status: DC

## 2015-10-06 NOTE — Progress Notes (Signed)
   Subjective:    Patient ID: Carla Oconnor, female    DOB: [birth year], 65 y.o.   MRN: [id number]  HPI In today for six-month office visit follow-up on essential hypertension, metabolic syndrome, hyperlipidemia, anxiety, controlled Type 2 diabetes mellitus. History of GE reflux and history of allergic rhinitis.  History of chronic back pain. Had MRI of lumbar spine 2015 showing lumbar spondylosis worst at L4-L5 with lateral recess, foraminal narrowing, central canal stenosis. Problem seemed worse on the left compared to the right at that time. Has had continued low back pain radiating into left buttock. She would like to have some treatment for this. She wanted to see Dr. Nickola Majoralton- [place]. Explained her Dr. Nickola Majoralton-[facility] may not be accepting insurance at this point in time. She will check and see.  Her sister died recently of complications of dementia. Patient has not been taking lipid-lowering medication regularly. Her lipid panel certainly reflects that today and was reviewed with her. Her hemoglobin A1c however has improved from 6.6%  to  6.2%  10 months ago her lipid panel was completely normal. Nail total cholesterol was 244, triglycerides 272 and LDL cholesterol 144. That she's not been taking her lipid-lowering medication on a regular basis due to death in the family.    Review of Systems see above     Objective:   Physical Exam  Skin warm and dry. Nodes none. Neck is supple without JVD thyromegaly or carotid bruits. Chest clear. Cardiac exam regular rate and rhythm. Extremities without edema.      Assessment & Plan:   Hyperlipidemia-noncompliance with medication. Restart lipid-lowering medication and return in 3 months for lipid panel liver functions without office visit. Physical exam due December 2017  Controlled type 2 diabetes mellitus-hemoglobin A1c is improved at 6.2%. Continue efforts at diet and exercise  Essential hypertension-stable. Is to monitor blood pressure at  home and continue same medications  Grief reaction-due to loss of sister  Anxiety-patient asked the clonazepam be refilled  Lumbar spondylosis-wants to see physiatrist. Will check and see if physiatrist of choice is accepting her insurance

## 2015-10-06 NOTE — Patient Instructions (Signed)
Please  restart lipid medication and take it on a regular basis. Continue other medications as previously prescribed. Please check with physiatrist. If not accepting your insurance, we will make another referral. Return in 3 months for lipid panel liver functions while taking lipid-lowering medication. Otherwise return in 6 months for physical exam.

## 2015-10-20 IMAGING — CR DG FOOT COMPLETE 3+V*R*
3 series · 3 of 3 positions shown · non-contrast
Comparison: None.

CLINICAL DATA: Inversion injury of the ankle and foot 2 months ago
with persistent pain in the fourth and fifth meta tarsal region

EXAM:
RIGHT FOOT COMPLETE - 3+ VIEW

[view not recorded (1 of 3)]
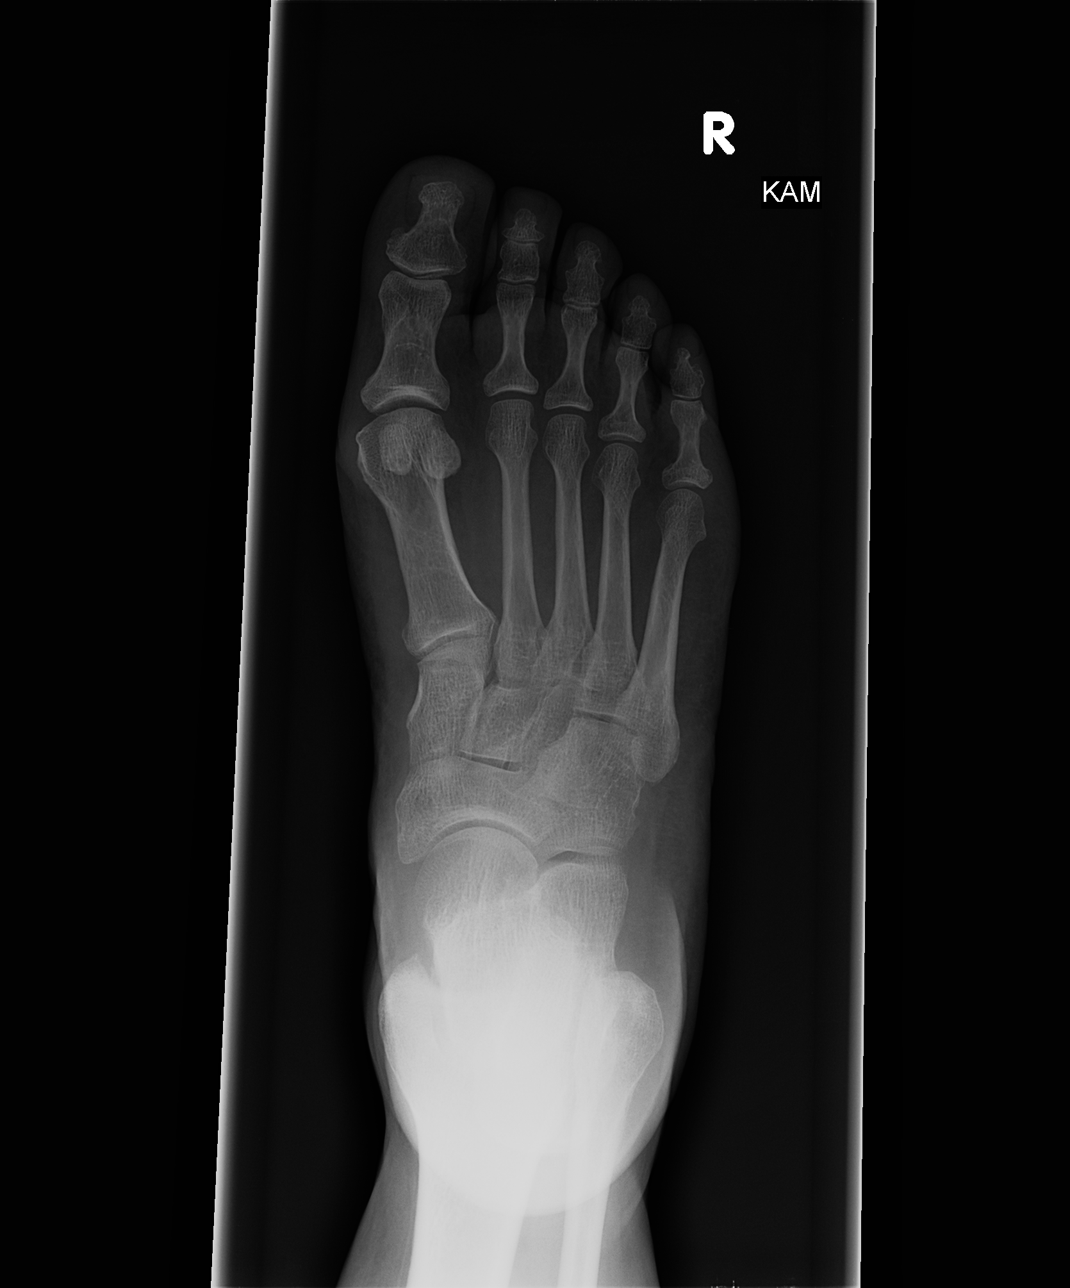

[view not recorded (2 of 3)]
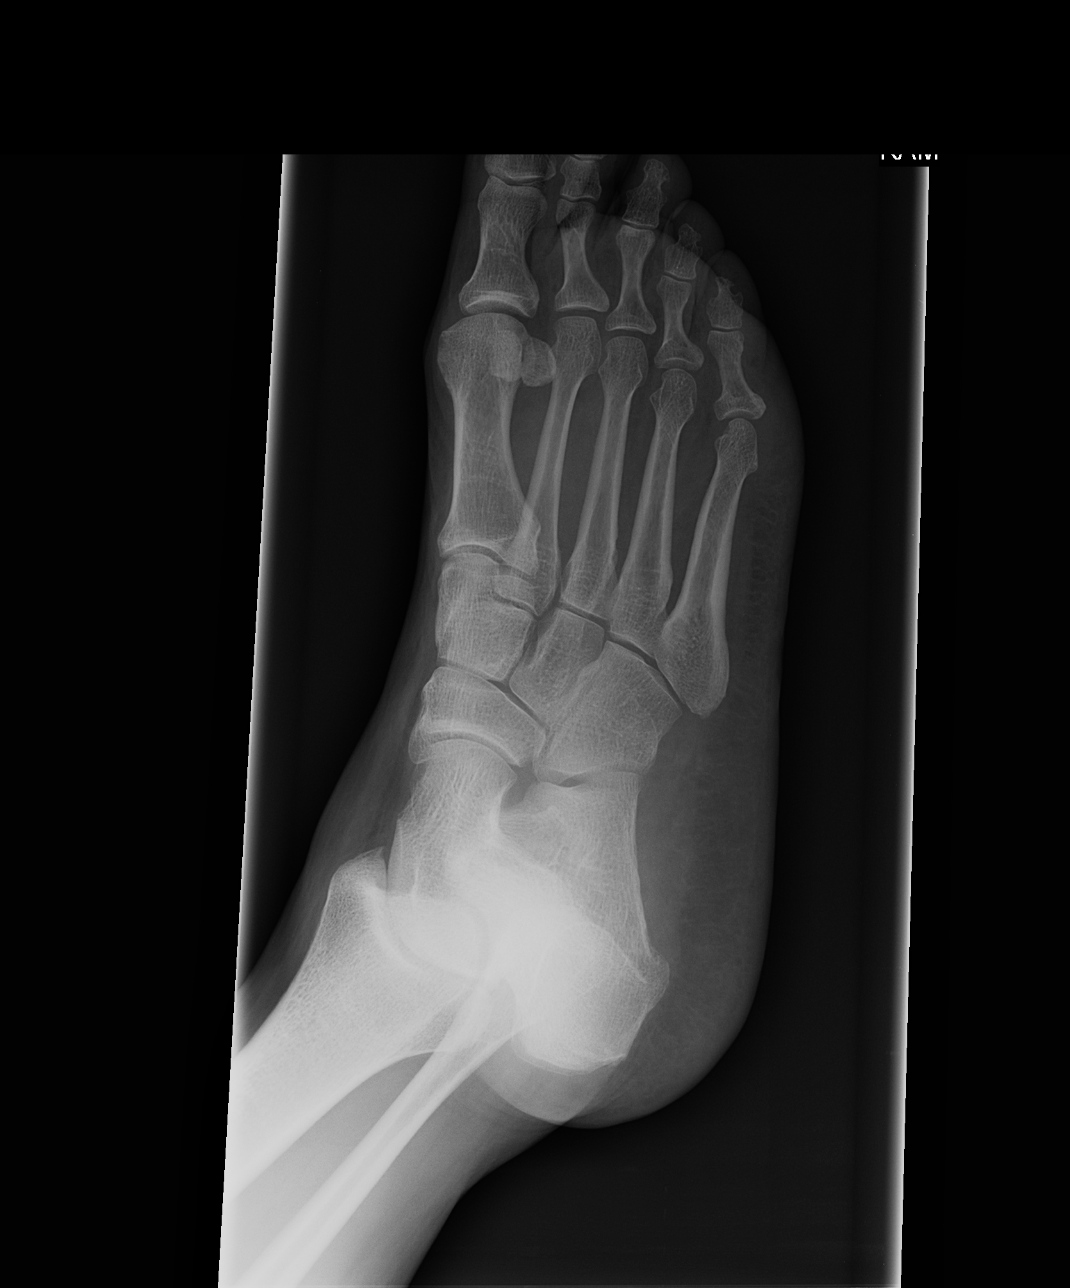

[view not recorded (3 of 3)]
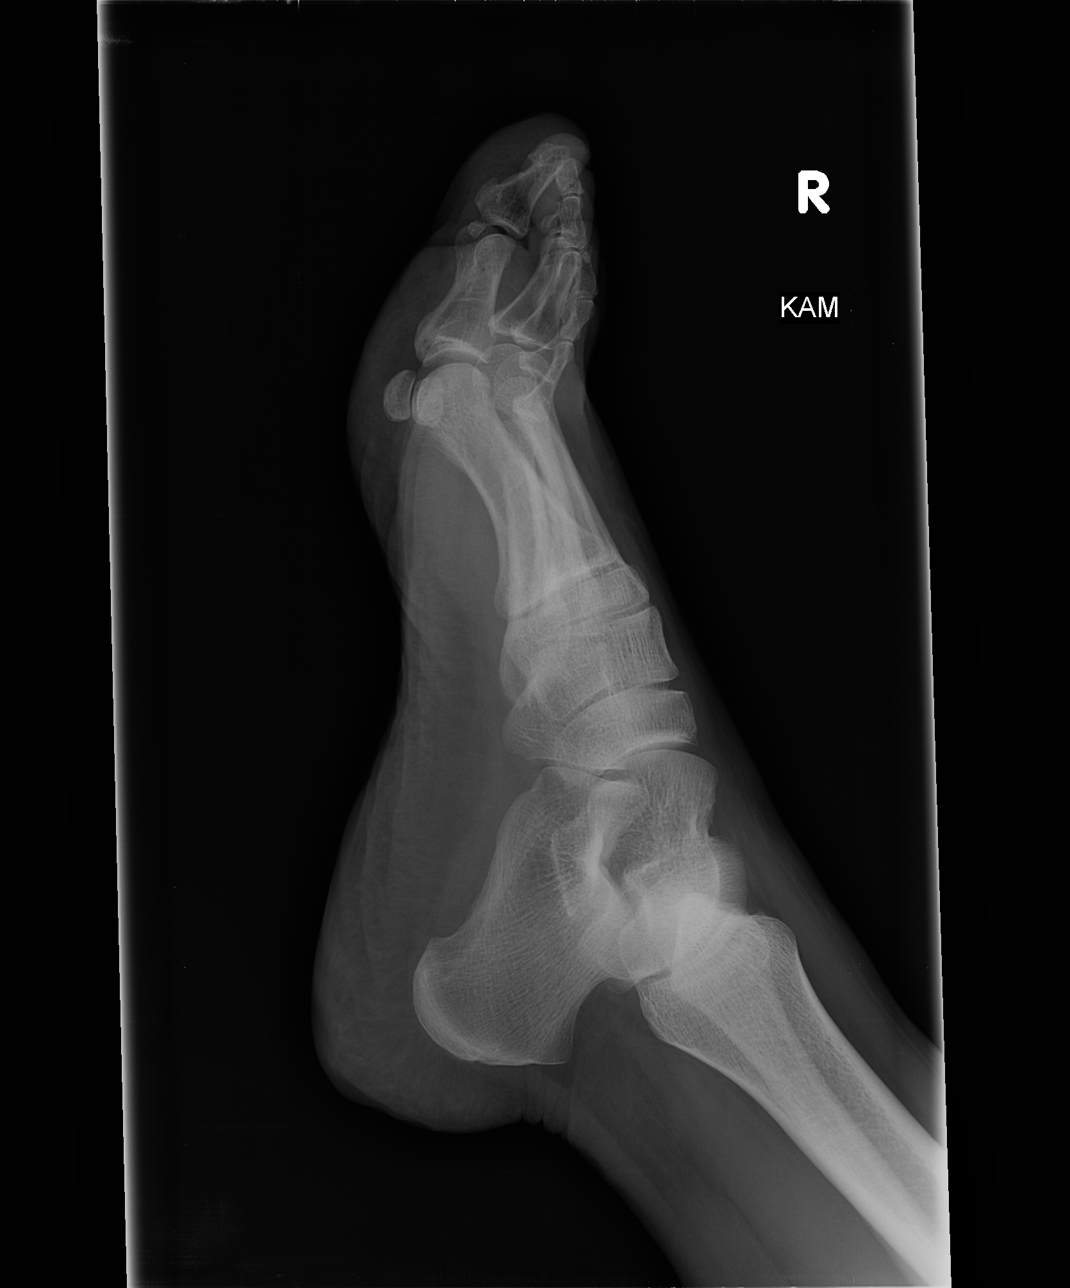

[3 of 3 positions shown; findings below may reference images not displayed]

FINDINGS: The bones of the right foot are adequately mineralized. There is no
acute or old fracture. There is no periosteal reaction. There is no
lytic or blastic lesion. The joint spaces are preserved. The bones
of the hindfoot are intact. Specific attention to the fourth and
fifth metatarsals reveals no acute abnormality.
IMPRESSION: There is no acute or chronic bony abnormality of the right foot.

## 2015-10-30 ENCOUNTER — Emergency Department (HOSPITAL_COMMUNITY): Payer: Medicare Other

## 2015-10-30 ENCOUNTER — Encounter (HOSPITAL_COMMUNITY): Payer: Self-pay | Admitting: Emergency Medicine

## 2015-10-30 DIAGNOSIS — Z7982 Long term (current) use of aspirin: Secondary | ICD-10-CM | POA: Diagnosis not present

## 2015-10-30 DIAGNOSIS — Z79899 Other long term (current) drug therapy: Secondary | ICD-10-CM | POA: Diagnosis not present

## 2015-10-30 DIAGNOSIS — E119 Type 2 diabetes mellitus without complications: Secondary | ICD-10-CM | POA: Insufficient documentation

## 2015-10-30 DIAGNOSIS — R0789 Other chest pain: Secondary | ICD-10-CM | POA: Insufficient documentation

## 2015-10-30 DIAGNOSIS — I10 Essential (primary) hypertension: Secondary | ICD-10-CM | POA: Insufficient documentation

## 2015-10-30 DIAGNOSIS — Z7984 Long term (current) use of oral hypoglycemic drugs: Secondary | ICD-10-CM | POA: Insufficient documentation

## 2015-10-30 DIAGNOSIS — J45909 Unspecified asthma, uncomplicated: Secondary | ICD-10-CM | POA: Diagnosis not present

## 2015-10-30 DIAGNOSIS — R079 Chest pain, unspecified: Secondary | ICD-10-CM | POA: Diagnosis present

## 2015-10-30 LAB — CBC
HCT: 36 % (ref 36.0–46.0)
Hemoglobin: 12.1 g/dL (ref 12.0–15.0)
MCH: 30 pg (ref 26.0–34.0)
MCHC: 33.6 g/dL (ref 30.0–36.0)
MCV: 89.1 fL (ref 78.0–100.0)
Platelets: 265 10*3/uL (ref 150–400)
RBC: 4.04 MIL/uL (ref 3.87–5.11)
RDW: 12.6 % (ref 11.5–15.5)
WBC: 6 10*3/uL (ref 4.0–10.5)

## 2015-10-30 LAB — BASIC METABOLIC PANEL
Anion gap: 8 (ref 5–15)
BUN: 17 mg/dL (ref 6–20)
CO2: 30 mmol/L (ref 22–32)
Calcium: 9.6 mg/dL (ref 8.9–10.3)
Chloride: 101 mmol/L (ref 101–111)
Creatinine, Ser: 0.88 mg/dL (ref 0.44–1.00)
GFR calc Af Amer: >60 mL/min (ref >60)
GFR calc non Af Amer: >60 mL/min (ref >60)
Glucose, Bld: 95 mg/dL (ref 65–99)
Potassium: 3.1 mmol/L — ABNORMAL LOW (ref 3.5–5.1)
Sodium: 139 mmol/L (ref 135–145)

## 2015-10-30 LAB — TROPONIN I: Troponin I: <0.03 ng/mL (ref <0.03)

## 2015-10-30 NOTE — ED Notes (Signed)
Pt states while driving today at 4pm she started having sharp left sided chest pains that were intermittent and when she got home she laid down and pain started becoming more constant and felt sob. Pt used inhaler which helped the sob. Pt is warm and dry in triage. Breathing is easy.

## 2015-10-31 ENCOUNTER — Emergency Department (HOSPITAL_COMMUNITY)
Admission: EM | Admit: 2015-10-31 | Discharge: 2015-10-31 | Disposition: A | Discharge status: Home / Self Care | Payer: Medicare Other | Attending: Emergency Medicine | Admitting: Emergency Medicine

## 2015-10-31 DIAGNOSIS — R0789 Other chest pain: Secondary | ICD-10-CM

## 2015-10-31 MED ORDER — KETOROLAC TROMETHAMINE 30 MG/ML IJ SOLN
30.0000 mg | Freq: Once | INTRAMUSCULAR | Status: AC
  Administered 2015-10-31: 30 mg via INTRAMUSCULAR
  Filled 2015-10-31: qty 1

## 2015-10-31 MED ORDER — IBUPROFEN 400 MG PO TABS: 400.0000 mg | ORAL_TABLET | Freq: Four times a day (QID) | ORAL | Status: DC | PRN

## 2015-10-31 NOTE — ED Provider Notes (Signed)
CSN: [id number]     Arrival date & time 10/30/15  2048 History   First MD Initiated Contact with Patient 10/31/15 0010     Chief Complaint  Patient presents with  . Chest Pain     (Consider location/radiation/quality/duration/timing/severity/associated sxs/prior Treatment) HPI Comments: This a 65 year old female who presents with 9 hours of the left chest pain.  She states it is located around her left breast and it moves in a circular pattern.  She can in juice.  The pain with certain arm movements or position.  She has not taken any medication for pain.  She states that she also developed some shortness of breath, which resolved with the use of an inhaler to use 1.  She states that she has been aggressively Q leaning her house, weeks and feels that this may be a muscle strain, but is concerned due to location of the pain. She has known hypokalemia and was restarted on her potassium supplement approximately one week ago by her primary care physician  Patient is a 65 y.o. female presenting with chest pain. The history is provided by the patient.  Chest Pain Pain location:  L chest Pain quality: aching   Pain radiates to:  Does not radiate Pain radiates to the back: no   Pain severity:  Moderate Onset quality:  Gradual Duration:  9 hours Timing:  Constant Progression:  Unchanged Chronicity:  New Context: lifting, movement and at rest   Relieved by:  Nothing Worsened by:  Certain positions Associated symptoms: no abdominal pain, no anxiety, no dizziness, no fever, no headache, no shortness of breath and no weakness     Past Medical History  Diagnosis Date  . Hypertension   . S/P hysterectomy   . GERD (gastroesophageal reflux disease)   . Hyperlipidemia   . Fibrocystic breast   . Asthma     as child and seasonal  . Neuromuscular disorder (HCC)     tennis elbow on right and left arm  . Diabetes mellitus without complication [facility])    Past Surgical History  Procedure  Laterality Date  . Cesarean section    . Breast surgery      biopsy rt breast-1960- benign.Left breast 1975  - lumpectomy - benign  . Abdominal hysterectomy      2003- still has left ovary  . Trigger finger release  02/16/2012    Procedure: RELEASE TRIGGER FINGER/A-1 PULLEY;  Surgeon: Wyn Forster., MD;  Location: [facility];  Service: Orthopedics;  Laterality: Right;  RIGHT RING FINGER    Family History  Problem Relation Age of Onset  . Stroke Father   . Asthma Other   . Hypertension Other   . Cancer Other   . Diabetes Other   . Hyperlipidemia Other    Social History  Substance Use Topics  . Smoking status: Never Smoker   . Smokeless tobacco: Never Used  . Alcohol Use: No   OB History    No data available     Review of Systems  Constitutional: Negative for fever and chills.  Respiratory: Negative for shortness of breath.   Cardiovascular: Positive for chest pain. Negative for leg swelling.  Gastrointestinal: Negative for abdominal pain.  Neurological: Negative for dizziness, weakness and headaches.  All other systems reviewed and are negative.     Allergies  Tramadol and Percocet  Home Medications   Prior to Admission medications   Medication Sig Start Date End Date Taking? Authorizing Provider  albuterol (PROVENTIL  HFA;VENTOLIN HFA) 108 (90 BASE) MCG/ACT inhaler Inhale 2 puffs into the lungs every 6 (six) hours as needed for wheezing or shortness of breath.   Yes Historical Provider, MD  aspirin 81 MG tablet Take 81 mg by mouth daily.   Yes Historical Provider, MD  cholecalciferol (VITAMIN D) 1000 UNITS tablet Take 1,000 Units by mouth daily.    Yes Historical Provider, MD  clonazePAM (KLONOPIN) 0.5 MG tablet Take 1 tablet (0.5 mg total) by mouth at bedtime. 10/06/15  Yes [name], MD  esomeprazole (NEXIUM) 20 MG capsule Take 20 mg by mouth daily at 12 noon.   Yes Historical Provider, MD  glipiZIDE (GLUCOTROL XL) 10 MG 24 hr tablet Take 1  tablet (10 mg total) by mouth daily with breakfast. 04/02/15  Yes [name], MD  metFORMIN (GLUCOPHAGE) 500 MG tablet TAKE 1 TABLET BY MOUTH TWICE DAILY WITH MEALS 05/07/15  Yes [name], MD  Multiple Vitamin (MULTIVITAMIN WITH MINERALS) TABS tablet Take 1 tablet by mouth daily.   Yes Historical Provider, MD  potassium chloride SA (KLOR-CON M20) 20 MEQ tablet Take 2 tablets (40 mEq total) by mouth daily. 05/26/15  Yes [name], MD  rosuvastatin (CRESTOR) 20 MG tablet Take 1 tablet (20 mg total) by mouth daily. 04/02/15  Yes [name], MD  valsartan-hydrochlorothiazide (DIOVAN-HCT) 80-12.5 MG tablet TAKE 1 TABLET BY MOUTH DAILY 06/22/15  Yes [name], MD  BAYER CONTOUR TEST test strip USE AS DIRECTED 11/08/13   [name], MD  [facility] LANCETS MISC Check blood sugar 4-5 times daily. She has a [employer] 01/20/14   [name], MD  ibuprofen (ADVIL,MOTRIN) 400 MG tablet Take 1 tablet (400 mg total) by mouth every 6 (six) hours as needed. 10/31/15   Earley Favor, NP  valACYclovir (VALTREX) 1000 MG tablet Take 1 tablet (1,000 mg total) by mouth 3 (three) times daily. Patient not taking: Reported on 10/31/2015 04/24/14   [name], MD   BP 141/69 mmHg  Pulse 61  Temp(Src) 97.8 F (36.6 C) (Oral)  Resp 16  Ht  (1.575 m)  Wt 87.998 kg  BMI 35.47 kg/m2  SpO2 99% Physical Exam  Constitutional: She is oriented to person, place, and time. She appears well-developed and well-nourished.  HENT:  Head: Normocephalic.  Eyes: Pupils are equal, round, and reactive to light.  Neck: Normal range of motion.  Cardiovascular: Normal rate and regular rhythm.   Pulmonary/Chest: Effort normal and breath sounds normal.  Abdominal: Soft.  Musculoskeletal: Normal range of motion. She exhibits no edema or tenderness.  Neurological: She is alert and oriented to person, place, and time.  Skin: Skin is warm.  Psychiatric: She has a normal mood and affect.  Nursing note and vitals  reviewed.   ED Course  Procedures (including critical care time) Labs Review Labs Reviewed  BASIC METABOLIC PANEL - Abnormal; Notable for the following:    Potassium 3.1 (*)    All other components within normal limits  CBC  TROPONIN I    Imaging Review Dg Chest 2 View  10/30/2015  CLINICAL DATA:  Left-sided chest pain, shortness of breath since 4 p.m. History of hypertension and diabetes. EXAM: CHEST  2 VIEW COMPARISON:  10/16/2014 FINDINGS: Shallow inspiration. Normal heart size and pulmonary vascularity. Slight linear atelectasis or fibrosis in the lung bases. No focal consolidation. No blunting of costophrenic angles. No pneumothorax. Tortuous aorta. IMPRESSION: Slight fibrosis or linear atelectasis in lung  bases. No focal consolidation. Electronically Signed   By: Burman NievesWilliam  Stevens M.D.   On: 10/30/2015 21:19   I have personally reviewed and evaluated these images and lab results as part of my medical decision-making.   EKG Interpretation   Date/Time:  Friday October 30 2015 20:52:43 EDT Ventricular Rate:  82 PR Interval:  156 QRS Duration: 76 QT Interval:  346 QTC Calculation: 404 R Axis:   27 Text Interpretation:  Normal sinus rhythm Nonspecific T wave abnormality  Abnormal ECG No significant change since last tracing Confirmed by POLLINA   MD, CHRISTOPHER [phone]([id number]) on 10/31/2015 1:52:08 AM     Will be given IM Toradol and reassessed for chest discomfort Has some relief from medications Will be discharged home with prescription for ibuprofen to take on a regular basis for the next several days.  She can safely apply heat or cold to the affected area.  She can follow-up with her primary care physician MDM   Final diagnoses:  Chest wall pain         Earley FavorGail Deana Krock, NP 10/31/15 [id number]  Gilda Creasehristopher J Pollina, MD 10/31/15 [phone]

## 2015-10-31 NOTE — Discharge Instructions (Signed)
Tonight your evaluated for chest pain your cardiac marker as discussed, is normal you EKG is unchanged.  Chest x-ray is normal , your labs are normal as well .  He been given an anti-inflammatory medication to take on a regular basis apply heat or cold to the affected area , which if a more comfortable for you  Chest Wall Pain Chest wall pain is pain in or around the bones and muscles of your chest. Sometimes, an injury causes this pain. Sometimes, the cause may not be known. This pain may take several weeks or longer to get better. HOME CARE Pay attention to any changes in your symptoms. Take these actions to help with your pain:  Rest as told by your doctor.  Avoid activities that cause pain. Try not to use your chest, belly (abdominal), or side muscles to lift heavy things.  If directed, apply ice to the painful area:  Put ice in a plastic bag.  Place a towel between your skin and the bag.  Leave the ice on for 20 minutes, 2-3 times per day.  Take over-the-counter and prescription medicines only as told by your doctor.  Do not use tobacco products, including cigarettes, chewing tobacco, and e-cigarettes. If you need help quitting, ask your doctor.  Keep all follow-up visits as told by your doctor. This is important. GET HELP IF:  You have a fever.  Your chest pain gets worse.  You have new symptoms. GET HELP RIGHT AWAY IF:  You feel sick to your stomach (nauseous) or you throw up (vomit).  You feel sweaty or light-headed.  You have a cough with phlegm (sputum) or you cough up blood.  You are short of breath.   This information is not intended to replace advice given to you by your health care provider. Make sure you discuss any questions you have with your health care provider.   Document Released: 09/21/2007 Document Revised: 12/24/2014 Document Reviewed: 06/30/2014 Elsevier Interactive Patient Education [employer]2016 Elsevier Inc.

## 2015-10-31 NOTE — ED Notes (Signed)
Patient Alert and oriented X4. Stable and ambulatory. Patient verbalized understanding of the discharge instructions.  Patient belongings were taken by the patient.  

## 2016-01-05 ENCOUNTER — Other Ambulatory Visit: Payer: Medicare Other | Admitting: Internal Medicine

## 2016-01-05 DIAGNOSIS — E785 Hyperlipidemia, unspecified: Secondary | ICD-10-CM

## 2016-01-05 LAB — HEPATIC FUNCTION PANEL
ALT: 19 U/L (ref 6–29)
AST: 19 U/L (ref 10–35)
Albumin: 4.2 g/dL (ref 3.6–5.1)
Alkaline Phosphatase: 57 U/L (ref 33–130)
Bilirubin, Direct: 0.1 mg/dL (ref <=0.2)
Indirect Bilirubin: 0.4 mg/dL (ref 0.2–1.2)
Total Bilirubin: 0.5 mg/dL (ref 0.2–1.2)
Total Protein: 6.6 g/dL (ref 6.1–8.1)

## 2016-01-05 LAB — LIPID PANEL
Cholesterol: 177 mg/dL (ref 125–200)
HDL: 53 mg/dL (ref >=46)
LDL Cholesterol: 93 mg/dL (ref <130)
Total CHOL/HDL Ratio: 3.3 Ratio (ref <=5.0)
Triglycerides: 153 mg/dL — ABNORMAL HIGH (ref <150)
VLDL: 31 mg/dL — ABNORMAL HIGH (ref <30)

## 2016-02-11 ENCOUNTER — Ambulatory Visit (INDEPENDENT_AMBULATORY_CARE_PROVIDER_SITE_OTHER): Payer: Medicare Other | Admitting: Internal Medicine

## 2016-02-11 ENCOUNTER — Encounter: Payer: Self-pay | Admitting: Internal Medicine

## 2016-02-11 VITALS — Wt 195.0 lb

## 2016-02-11 DIAGNOSIS — H6503 Acute serous otitis media, bilateral: Secondary | ICD-10-CM | POA: Diagnosis not present

## 2016-02-11 DIAGNOSIS — J0100 Acute maxillary sinusitis, unspecified: Secondary | ICD-10-CM

## 2016-02-11 MED ORDER — HYDROCOD POLST-CPM POLST ER 10-8 MG/5ML PO SUER: 5.0000 mL | Freq: Two times a day (BID) | ORAL | 0 refills | Status: DC | PRN

## 2016-02-11 MED ORDER — AZITHROMYCIN 250 MG PO TABS: ORAL_TABLET | ORAL | 0 refills | Status: DC

## 2016-02-11 NOTE — Progress Notes (Signed)
   Subjective:    Patient ID: Carla BarthelBarbara I Oconnor, female    DOB: 1950/07/12, 65 y.o.   MRN: [id number]  HPI 65 year old Female in today with cough and congestion for the past couple of weeks. Not getting better with over-the-counter medications. Has had some discolored nasal drainage and sputum production. Has felt chills. No documented fever. Has had some myalgias.    Review of Systems malaise and fatigue     Objective:   Physical Exam  Skin warm and dry. Nodes none. TMs are dull bilaterally. Pharynx is clear. She sounds nasally congested. Neck supple without adenopathy. Chest clear to auscultation      Assessment & Plan:   Acute serous BOM  Acute sinusitis  Plan Tussionex oneTeaspoon po q 12 hours prn cough Zithromax Z pak 2 po day 1 followed by one po days 2-5

## 2016-02-12 NOTE — Patient Instructions (Signed)
Zithromax Z-PAK take 2 tabs day one followed by 1 tab days 2 through 5. Tussionex 1 teaspoon by mouth every 12 hours when necessary cough

## 2016-03-02 ENCOUNTER — Telehealth: Payer: Self-pay | Admitting: Internal Medicine

## 2016-03-02 DIAGNOSIS — J069 Acute upper respiratory infection, unspecified: Secondary | ICD-10-CM

## 2016-03-02 MED ORDER — AZITHROMYCIN 250 MG PO TABS: ORAL_TABLET | ORAL | 0 refills | Status: DC

## 2016-03-02 NOTE — Telephone Encounter (Signed)
Informed patient zpak was refilled.

## 2016-03-02 NOTE — Telephone Encounter (Signed)
Refill Z-Pak once

## 2016-03-02 NOTE — Telephone Encounter (Signed)
Patient called to see if she can get a refill of her zpak.  She says she still has congestion.  No fever.  Finished medication about a week ago.  She is still taking the cough medicine but still has congestion and wants to know if she can get another dose of the zpak.  Please advise.

## 2016-03-24 ENCOUNTER — Other Ambulatory Visit: Payer: Self-pay | Admitting: Internal Medicine

## 2016-03-24 ENCOUNTER — Other Ambulatory Visit: Payer: Medicare Other | Admitting: Internal Medicine

## 2016-03-24 DIAGNOSIS — Z0000 Encounter for general adult medical examination without abnormal findings: Secondary | ICD-10-CM

## 2016-03-24 DIAGNOSIS — I10 Essential (primary) hypertension: Secondary | ICD-10-CM

## 2016-03-24 DIAGNOSIS — E785 Hyperlipidemia, unspecified: Secondary | ICD-10-CM

## 2016-03-24 DIAGNOSIS — E118 Type 2 diabetes mellitus with unspecified complications: Secondary | ICD-10-CM

## 2016-03-24 LAB — CBC WITH DIFFERENTIAL/PLATELET
Basophils Absolute: 0 cells/uL (ref 0–200)
Basophils Relative: 0 %
Eosinophils Absolute: 188 cells/uL (ref 15–500)
Eosinophils Relative: 4 %
HCT: 34.6 % — ABNORMAL LOW (ref 35.0–45.0)
Hemoglobin: 11.2 g/dL — ABNORMAL LOW (ref 11.7–15.5)
Lymphocytes Relative: 42 %
Lymphs Abs: 1974 cells/uL (ref 850–3900)
MCH: 29.2 pg (ref 27.0–33.0)
MCHC: 32.4 g/dL (ref 32.0–36.0)
MCV: 90.3 fL (ref 80.0–100.0)
MPV: 9.1 fL (ref 7.5–12.5)
Monocytes Absolute: 423 cells/uL (ref 200–950)
Monocytes Relative: 9 %
Neutro Abs: 2115 cells/uL (ref 1500–7800)
Neutrophils Relative %: 45 %
Platelets: 263 10*3/uL (ref 140–400)
RBC: 3.83 MIL/uL (ref 3.80–5.10)
RDW: 13.9 % (ref 11.0–15.0)
WBC: 4.7 10*3/uL (ref 3.8–10.8)

## 2016-03-24 LAB — COMPLETE METABOLIC PANEL WITH GFR
ALT: 11 U/L (ref 6–29)
AST: 16 U/L (ref 10–35)
Albumin: 4.2 g/dL (ref 3.6–5.1)
Alkaline Phosphatase: 58 U/L (ref 33–130)
BUN: 19 mg/dL (ref 7–25)
CO2: 29 mmol/L (ref 20–31)
Calcium: 9.2 mg/dL (ref 8.6–10.4)
Chloride: 103 mmol/L (ref 98–110)
Creat: 1.01 mg/dL — ABNORMAL HIGH (ref 0.50–0.99)
GFR, Est African American: 67 mL/min (ref >=60)
GFR, Est Non African American: 58 mL/min — ABNORMAL LOW (ref >=60)
Glucose, Bld: 120 mg/dL — ABNORMAL HIGH (ref 65–99)
Potassium: 4 mmol/L (ref 3.5–5.3)
Sodium: 142 mmol/L (ref 135–146)
Total Bilirubin: 0.6 mg/dL (ref 0.2–1.2)
Total Protein: 6.5 g/dL (ref 6.1–8.1)

## 2016-03-24 LAB — LIPID PANEL
Cholesterol: 175 mg/dL (ref <200)
HDL: 51 mg/dL (ref >50)
LDL Cholesterol: 101 mg/dL — ABNORMAL HIGH (ref <100)
Total CHOL/HDL Ratio: 3.4 Ratio (ref <5.0)
Triglycerides: 117 mg/dL (ref <150)
VLDL: 23 mg/dL (ref <30)

## 2016-03-24 LAB — TSH: TSH: 1.39 mIU/L

## 2016-03-25 LAB — HEMOGLOBIN A1C
Hgb A1c MFr Bld: 6.5 % — ABNORMAL HIGH (ref <5.7)
Mean Plasma Glucose: 140 mg/dL

## 2016-03-25 LAB — MICROALBUMIN, URINE: Microalb, Ur: 1.3 mg/dL

## 2016-03-28 ENCOUNTER — Telehealth: Payer: Self-pay | Admitting: Internal Medicine

## 2016-03-28 ENCOUNTER — Ambulatory Visit (INDEPENDENT_AMBULATORY_CARE_PROVIDER_SITE_OTHER): Payer: Medicare Other | Admitting: Internal Medicine

## 2016-03-28 ENCOUNTER — Encounter: Payer: Self-pay | Admitting: Internal Medicine

## 2016-03-28 VITALS — BP 142/86 | HR 95 | Temp 97.7°F | Ht 62.0 in | Wt 196.0 lb

## 2016-03-28 DIAGNOSIS — Z0000 Encounter for general adult medical examination without abnormal findings: Secondary | ICD-10-CM | POA: Diagnosis not present

## 2016-03-28 DIAGNOSIS — R7989 Other specified abnormal findings of blood chemistry: Secondary | ICD-10-CM | POA: Diagnosis not present

## 2016-03-28 DIAGNOSIS — Z23 Encounter for immunization: Secondary | ICD-10-CM | POA: Diagnosis not present

## 2016-03-28 DIAGNOSIS — I10 Essential (primary) hypertension: Secondary | ICD-10-CM

## 2016-03-28 DIAGNOSIS — K219 Gastro-esophageal reflux disease without esophagitis: Secondary | ICD-10-CM | POA: Diagnosis not present

## 2016-03-28 DIAGNOSIS — Z87448 Personal history of other diseases of urinary system: Secondary | ICD-10-CM

## 2016-03-28 DIAGNOSIS — E7800 Pure hypercholesterolemia, unspecified: Secondary | ICD-10-CM | POA: Diagnosis not present

## 2016-03-28 DIAGNOSIS — K625 Hemorrhage of anus and rectum: Secondary | ICD-10-CM

## 2016-03-28 DIAGNOSIS — J309 Allergic rhinitis, unspecified: Secondary | ICD-10-CM | POA: Diagnosis not present

## 2016-03-28 DIAGNOSIS — E119 Type 2 diabetes mellitus without complications: Secondary | ICD-10-CM | POA: Diagnosis not present

## 2016-03-28 LAB — POCT URINALYSIS DIPSTICK
Bilirubin, UA: NEGATIVE
Glucose, UA: NEGATIVE
Ketones, UA: NEGATIVE
Leukocytes, UA: NEGATIVE
Nitrite, UA: NEGATIVE
Protein, UA: NEGATIVE
Spec Grav, UA: 1.01
Urobilinogen, UA: NEGATIVE
pH, UA: 7

## 2016-03-28 LAB — BUN/CREATININE RATIO
BUN/Creatinine Ratio: 18.4 Ratio (ref 6–22)
BUN: 16 mg/dL (ref 7–25)
Creat: 0.87 mg/dL (ref 0.50–0.99)
GFR, Est African American: 80 mL/min (ref >=60)
GFR, Est Non African American: 70 mL/min (ref >=60)

## 2016-03-28 LAB — HEMOCCULT GUIAC POC 1CARD (OFFICE): Fecal Occult Blood, POC: POSITIVE — AB

## 2016-03-28 NOTE — Patient Instructions (Addendum)
It was a pleasure to see you today. Serum creatinine will be repeated today nonfasting. Ask pharmacist if there is a potassium supplement that she can try that is not so large. Appointment with Dr. Evette CristalGanem tomorrow regarding rectal bleeding. Return in 6 months or as needed. Flu vaccine given.

## 2016-03-28 NOTE — Telephone Encounter (Signed)
Call to [facility] to add Hep C test due to exposure.

## 2016-03-28 NOTE — Progress Notes (Signed)
Subjective:    Patient ID: KEYARA ENT, female    DOB: [birth year], 65 y.o.   MRN: [id number]  HPI  65 year old Female for health maintenance exam and evaluation of medical issues.  Patient has a history of controlled type 2 diabetes mellitus treated with medication, hyperlipidemia, essential hypertension, GE reflux and history of allergic rhinitis. History of fibrocystic breast disease and asthma.  She is status post hysterectomy with right oophorectomy and right salpingectomy in 2003. She has a history of occasionally having occult blood detected on urine dipstick. Recently has noticed some rectal bleedin when she eats certain vegetables. Has seen some dark brown spotting in her panties from time to time for the past few weeks. Is asymptomatic with regard to UTI, abdominal pain, dysuria.  She is due for colonoscopy.  History of left lateral epicondylitis 2013. Carpal tunnel release right hand October 2013 by Dr. Teressa Senter. Necessary and sections 1975 and 1977. Benign right breast biopsy done in 1966 and 1976. Right trigger finger release September 2009. Right third trigger finger treated July 2007.  Patient says she has had hepatitis C exposure via her husband. This is the first I have heard of this today. We will do hepatitis C antibody.  Social history: She is married. Has one set of twins, a boy and a girl in their 30s. She also has another son. 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 She has never smoked. No alcohol consumption. Husband is a English as a second language teacher in a [place] veteran.  In the Spring of 2014 she was seen by cardiologist regarding chest pain. She had a Cardiolite study which was negative. She also had a previous Cardiolite study in 2009 that was negative. History of cardiac catheterization by Dr. Marena Chancy 2003.  Family history: Father died at age 35 with a stroke. Mother with history of diabetes and hypertension. One brother in fairly  good health who has had some trouble with crack addiction. Total of 4 sisters. She has to hold sisters and 2 half sisters. One sister died of kidney failure and breast cancer. One sister with history of diabetes and coronary artery disease.    Review of Systems - has seen dark brown spot in panties past 3-4 months about every day. Had fibroids and underwent  Hysterectomy, right Fallopian tube removed and right oophorectomy in 2003.  Hx occult blood in urine on dipstick.     Objective:   Physical Exam  Cardiovascular: Normal rate.   Pulmonary/Chest:  Breasts normal female without masses  Genitourinary:  Genitourinary Comments: Inspection of vagina shows vaginal cuff. No evidence of any lesions that are bleeding. Bimanual exam is normal. Rectovaginal confirms. There is some bright red blood on exam glove. Stool was guaiac positive.          Assessment & Plan:  Elevated serum creatinine-Repeat creatinine today since she is not fasting  HTN-continue to monitor  Impaired glucose tolerance-stable and we'll continue to monitor all medication  History of hepatitis C exposure from husband. Hepatitis C antibody drawn today at patient's request  Rectal bleeding-to see Dr. Evette Cristal. Needs follow-up colonoscopy.  Allergic rhinitis  History of asthma  Fibrocystic breast disease  Hyperlipidemia-stable on medication.  GE reflux-treated with generic Nexium.    Plan: Flu vaccine given. [facility] gastroenterologist later this week. Continue glipizide and metformin for diabetes mellitus. Continue Crestor for hyperlipidemia. Continue valsartan HCTZ for hypertension. With regard to potassium supplement, she is complaining about the size  of the pill. She will be to pharmacist about this.  Otherwise return in 6 months for office visit, hemoglobin A1c, lipid panel liver functions, blood pressure check.  Appt. with Dr. Evette CristalGanem tomorrow regarding rectal bleeding.  Subjective:   Patient presents for  Medicare Annual/Subsequent preventive examination.  Review Past Medical/Family/Social:See above   Risk Factors  Current exercise habits: Low-fat low carbohydrate Dietary issues discussed: [facility] at [facility]  Cardiac risk factors:  Depression Screen  (Note: if answer to either of the following is "Yes", a more complete depression screening is indicated)   Over the past two weeks, have you felt down, depressed or hopeless? No  Over the past two weeks, have you felt little interest or pleasure in doing things? No Have you lost interest or pleasure in daily life? No Do you often feel hopeless? No Do you cry easily over simple problems? No   Activities of Daily Living  In your present state of health, do you have any difficulty performing the following activities?:   Driving? No  Managing money? No  Feeding yourself? No  Getting from bed to chair? Sometimes Climbing a flight of stairs? No  Preparing food and eating?: No  Bathing or showering? No  Getting dressed: No  Getting to the toilet? No  Using the toilet:No  Moving around from place to place: No  In the past year have you fallen or had a near fall?:No  Are you sexually active? No  Do you have more than one partner? No   Hearing Difficulties: No  Do you often ask people to speak up or repeat themselves? No  Do you experience ringing or noises in your ears? No  Do you have difficulty understanding soft or whispered voices? Sometimes Do you feel that you have a problem with memory? No Do you often misplace items? Sometimes tickly if in a hurry   Home Safety:  Do you have a smoke alarm at your residence? Yes Do you have grab bars in the bathroom?Yes Do you have throw rugs in your house? Yes   Cognitive Testing  Alert? Yes Normal Appearance?Yes  Oriented to person? Yes Place? Yes  Time? Yes  Recall of three objects? Yes  Can perform simple calculations? Yes  Displays appropriate judgment?Yes  Can read the correct  time from a watch face?Yes   List the Names of Other Physician/Practitioners you currently use:  See referral list for the physicians patient is currently seeing.     Review of Systems: See above  Objective:     General appearance: Appears stated age and mildly obese  Head: Normocephalic, without obvious abnormality, atraumatic  Eyes: conj clear, EOMi PEERLA  Ears: normal TM's and external ear canals both ears  Nose: Nares normal. Septum midline. Mucosa normal. No drainage or sinus tenderness.  Throat: lips, mucosa, and tongue normal; teeth and gums normal  Neck: no adenopathy, no carotid bruit, no JVD, supple, symmetrical, trachea midline and thyroid not enlarged, symmetric, no tenderness/mass/nodules  No CVA tenderness.  Lungs: clear to auscultation bilaterally  Breasts: normal appearance, no masses or tenderness,  Heart: regular rate and rhythm, S1, S2 normal, no murmur, click, rub or gallop  Abdomen: soft, non-tender; bowel sounds normal; no masses, no organomegaly  Musculoskeletal: ROM normal in all joints, no crepitus, no deformity, Normal muscle strengthen. Back  is symmetric, no curvature. Skin: Skin color, texture, turgor normal. No rashes or lesions  Lymph nodes: Cervical, supraclavicular, and axillary nodes normal.  Neurologic: CN 2 -12  Normal, Normal symmetric reflexes. Normal coordination and gait  Psych: Alert & Oriented x 3, Mood appear stable.    Assessment:    Annual wellness medicare exam   Plan:    During the course of the visit the patient was educated and counseled about appropriate screening and preventive services including:   Annual mammogram  Bone density study  Annual flu vaccine     Patient Instructions (the written plan) was given to the patient.  Medicare Attestation  I have personally reviewed:  The patient's medical and social history  Their use of alcohol, tobacco or illicit drugs  Their current medications and supplements  The  patient's functional ability including ADLs,fall risks, home safety risks, cognitive, and hearing and visual impairment  Diet and physical activities  Evidence for depression or mood disorders  The patient's weight, height, BMI, and visual acuity have been recorded in the chart. I have made referrals, counseling, and provided education to the patient based on review of the above and I have provided the patient with a written personalized care plan for preventive services.

## 2016-03-29 ENCOUNTER — Telehealth: Payer: Self-pay | Admitting: Internal Medicine

## 2016-03-29 LAB — URINALYSIS, MICROSCOPIC ONLY
Bacteria, UA: NONE SEEN [HPF]
Casts: NONE SEEN [LPF]
Crystals: NONE SEEN [HPF]
Yeast: NONE SEEN [HPF]

## 2016-03-29 LAB — HEPATITIS C ANTIBODY: HCV Ab: NEGATIVE

## 2016-03-29 NOTE — Telephone Encounter (Signed)
-----   Message from Margaree MackintoshMary J Baxley, MD sent at 03/29/2016 10:00 AM EST ----- No Hep C antibody

## 2016-03-29 NOTE — Telephone Encounter (Signed)
Patient aware of negative lab results.

## 2016-04-26 ENCOUNTER — Other Ambulatory Visit: Payer: Self-pay | Admitting: Internal Medicine

## 2016-04-26 NOTE — Telephone Encounter (Signed)
Refill x 6 months 

## 2016-04-27 ENCOUNTER — Telehealth: Payer: Self-pay | Admitting: Internal Medicine

## 2016-04-27 MED ORDER — GLIPIZIDE ER 10 MG PO TB24: 10.0000 mg | ORAL_TABLET | Freq: Every day | ORAL | 2 refills | Status: DC

## 2016-04-27 NOTE — Telephone Encounter (Signed)
Glipizide refilled for a year per Dr Lenord FellersBaxley.

## 2016-04-27 NOTE — Telephone Encounter (Signed)
Patient called requesting 90 day supply glipizide sent to [facility] [address].

## 2016-04-27 NOTE — Telephone Encounter (Signed)
Please do this for one year  

## 2016-06-17 ENCOUNTER — Telehealth: Payer: Self-pay | Admitting: Internal Medicine

## 2016-06-17 NOTE — Telephone Encounter (Signed)
Depends on insurance. Have her speak with pharmacist to see what is covered then let us know

## 2016-06-17 NOTE — Telephone Encounter (Signed)
Patient is wanting to know if she can switch over to a capsule for her Potassium?  She wants this instead of the tablet.

## 2016-06-17 NOTE — Telephone Encounter (Signed)
Patient called back and states she spoke with the pharmacist who states that her insurance will pay for the capsule form of Potassium and that they have it in stock.  Advised I would let Dr. Lenord FellersBaxley know this information.

## 2016-06-17 NOTE — Telephone Encounter (Signed)
Patient instructed to contact the Pharmacy and speak with Pharmacist to see what her insurance will cover in terms of capsule with Potassium.  Once she finds this information out, she is to call us back and we can proceed.  Patient verbalized understanding of these instructions.

## 2016-06-20 ENCOUNTER — Other Ambulatory Visit: Payer: Self-pay

## 2016-06-20 MED ORDER — POTASSIUM CHLORIDE CRYS ER 20 MEQ PO TBCR: 40.0000 meq | EXTENDED_RELEASE_TABLET | Freq: Every day | ORAL | 1 refills | Status: DC

## 2016-06-20 NOTE — Telephone Encounter (Signed)
Done

## 2016-06-20 NOTE — Telephone Encounter (Signed)
Carla Oconnor please call pharmacist directly and tell them pt wants capsule form of potassium replacement same dose with 6 monthrefills

## 2016-07-08 ENCOUNTER — Other Ambulatory Visit: Payer: Self-pay | Admitting: Internal Medicine

## 2016-07-25 ENCOUNTER — Other Ambulatory Visit: Payer: Self-pay | Admitting: Internal Medicine

## 2016-08-24 ENCOUNTER — Encounter: Payer: Self-pay | Admitting: Internal Medicine

## 2016-08-24 ENCOUNTER — Ambulatory Visit (INDEPENDENT_AMBULATORY_CARE_PROVIDER_SITE_OTHER): Payer: Medicare Other | Admitting: Internal Medicine

## 2016-08-24 ENCOUNTER — Telehealth: Payer: Self-pay | Admitting: Internal Medicine

## 2016-08-24 VITALS — BP 140/84 | HR 84 | Temp 100.1°F

## 2016-08-24 DIAGNOSIS — J069 Acute upper respiratory infection, unspecified: Secondary | ICD-10-CM | POA: Diagnosis not present

## 2016-08-24 DIAGNOSIS — H1032 Unspecified acute conjunctivitis, left eye: Secondary | ICD-10-CM

## 2016-08-24 MED ORDER — AZITHROMYCIN 250 MG PO TABS: ORAL_TABLET | ORAL | 1 refills | Status: DC

## 2016-08-24 MED ORDER — HYDROCOD POLST-CPM POLST ER 10-8 MG/5ML PO SUER: 5.0000 mL | Freq: Two times a day (BID) | ORAL | 0 refills | Status: DC | PRN

## 2016-08-24 NOTE — Telephone Encounter (Signed)
See OV note has URI and left conjunctivitis. Call in Z-pak, Ocuflox opthalmic solution and Rx for Tussionex.

## 2016-08-25 ENCOUNTER — Encounter: Payer: Self-pay | Admitting: Internal Medicine

## 2016-08-25 ENCOUNTER — Other Ambulatory Visit: Payer: Self-pay | Admitting: Internal Medicine

## 2016-08-25 MED ORDER — OFLOXACIN 0.3 % OP SOLN: 2.0000 [drp] | Freq: Four times a day (QID) | OPHTHALMIC | 0 refills | Status: DC

## 2016-08-25 NOTE — Progress Notes (Signed)
   Subjective:    Patient ID: Carla BarthelBarbara I Oconnor, female    DOB: 1950-11-13, 66 y.o.   MRN: [id number]  HPI Onset about 2-3 days ago of burning throat and now with cough with little sputum production. Has malaise and fatigue. Noticed drainage from left eye.    Review of Systems     Objective:   Physical Exam  Constitutional: She is oriented to person, place, and time. She appears well-nourished.  HENT:  Head: Normocephalic and atraumatic.  Right Ear: External ear normal.  Left Ear: External ear normal.  Pharynx red without exudate.  Eyes: Right eye exhibits no discharge. No scleral icterus.  Left eye conjuncitivitis  Neck: Neck supple. No thyromegaly present.  Cardiovascular: Normal rate, normal heart sounds and intact distal pulses.   Pulmonary/Chest: Effort normal and breath sounds normal. No respiratory distress. She has no wheezes. She has no rales.  Lymphadenopathy:    She has no cervical adenopathy.  Neurological: She is alert and oriented to person, place, and time.  Skin: Skin is warm and dry. She is not diaphoretic.          Assessment & Plan:  Acute URI  Conjunctivitis  Plan: Zithromax Z pak. Tussionex one tsp po q 12 hours prn cough, Ocuflox opthalmic solution 2 drops each eye 4 times daily x 5 days.

## 2016-08-25 NOTE — Patient Instructions (Signed)
Take Z pak as directed and Tussionex for cough as needed. Use eye drops for conjunctivitis as directed x 5 days.

## 2016-09-22 ENCOUNTER — Other Ambulatory Visit: Payer: Medicare Other | Admitting: Internal Medicine

## 2016-09-22 DIAGNOSIS — E119 Type 2 diabetes mellitus without complications: Secondary | ICD-10-CM

## 2016-09-22 DIAGNOSIS — E785 Hyperlipidemia, unspecified: Secondary | ICD-10-CM

## 2016-09-22 DIAGNOSIS — I10 Essential (primary) hypertension: Secondary | ICD-10-CM

## 2016-09-22 LAB — HEPATIC FUNCTION PANEL
ALT: 19 U/L (ref 6–29)
AST: 22 U/L (ref 10–35)
Albumin: 4.3 g/dL (ref 3.6–5.1)
Alkaline Phosphatase: 62 U/L (ref 33–130)
Bilirubin, Direct: 0.1 mg/dL (ref <=0.2)
Indirect Bilirubin: 0.4 mg/dL (ref 0.2–1.2)
Total Bilirubin: 0.5 mg/dL (ref 0.2–1.2)
Total Protein: 6.9 g/dL (ref 6.1–8.1)

## 2016-09-22 LAB — LIPID PANEL
Cholesterol: 160 mg/dL (ref <200)
HDL: 56 mg/dL (ref >50)
LDL Cholesterol: 77 mg/dL (ref <100)
Total CHOL/HDL Ratio: 2.9 Ratio (ref <5.0)
Triglycerides: 137 mg/dL (ref <150)
VLDL: 27 mg/dL (ref <30)

## 2016-09-22 LAB — BASIC METABOLIC PANEL
BUN: 15 mg/dL (ref 7–25)
CO2: 28 mmol/L (ref 20–31)
Calcium: 9.3 mg/dL (ref 8.6–10.4)
Chloride: 102 mmol/L (ref 98–110)
Creat: 0.91 mg/dL (ref 0.50–0.99)
Glucose, Bld: 112 mg/dL — ABNORMAL HIGH (ref 65–99)
Potassium: 4.1 mmol/L (ref 3.5–5.3)
Sodium: 141 mmol/L (ref 135–146)

## 2016-09-23 LAB — HEMOGLOBIN A1C
Hgb A1c MFr Bld: 6.5 % — ABNORMAL HIGH (ref <5.7)
Mean Plasma Glucose: 140 mg/dL

## 2016-09-26 ENCOUNTER — Ambulatory Visit: Payer: Medicare Other | Admitting: Internal Medicine

## 2016-09-29 ENCOUNTER — Ambulatory Visit (INDEPENDENT_AMBULATORY_CARE_PROVIDER_SITE_OTHER): Payer: Medicare Other | Admitting: Internal Medicine

## 2016-09-29 ENCOUNTER — Encounter: Payer: Self-pay | Admitting: Internal Medicine

## 2016-09-29 VITALS — BP 130/64 | HR 82 | Temp 97.8°F | Wt 197.0 lb

## 2016-09-29 DIAGNOSIS — Z6836 Body mass index (BMI) 36.0-36.9, adult: Secondary | ICD-10-CM

## 2016-09-29 DIAGNOSIS — E8881 Metabolic syndrome: Secondary | ICD-10-CM | POA: Diagnosis not present

## 2016-09-29 DIAGNOSIS — I10 Essential (primary) hypertension: Secondary | ICD-10-CM | POA: Diagnosis not present

## 2016-09-29 DIAGNOSIS — E7800 Pure hypercholesterolemia, unspecified: Secondary | ICD-10-CM | POA: Diagnosis not present

## 2016-09-29 DIAGNOSIS — K219 Gastro-esophageal reflux disease without esophagitis: Secondary | ICD-10-CM

## 2016-09-29 DIAGNOSIS — Z23 Encounter for immunization: Secondary | ICD-10-CM | POA: Diagnosis not present

## 2016-09-29 DIAGNOSIS — E119 Type 2 diabetes mellitus without complications: Secondary | ICD-10-CM

## 2016-09-29 DIAGNOSIS — G4709 Other insomnia: Secondary | ICD-10-CM

## 2016-09-29 NOTE — Progress Notes (Signed)
   Subjective:    Patient ID: Carla Oconnor, female    DOB: 09-Jul-1950, 65 y.o.   MRN: [id number]  HPI  66 year old Black Female for 6 month recheck.Had colonoscopy Dr. Penelope Coop 06/01/2016. Pneumovax 23 given today. This was done because of rectal bleeding.  Patient has a history of controlled type 2 diabetes mellitus without complication, hyperlipidemia, essential hypertension, GE reflux and history of allergic rhinitis. History of fibrocystic breast disease and asthma.  Currently feeling well without any new complaints.  Blood pressure stable on current regimen. She takes valsartan HCTZ. She takes Crestor for hyperlipidemia. She takes glipizide and metformin for diabetes mellitus.  B- met is normal except for glucose of 112, hemoglobin A1c is 6.5% and was 6.5% 6 months ago. Lipid panel and liver functions are entirely within normal limits.  BUN and creatinine are normal      Review of Systems see above     Objective:   Physical Exam  Skin warm and dry. Nodes none. Neck is supple without thyromegaly. No JVD or carotid bruits appreciated. Chest clear. Cardiac exam regular rate and rhythm normal S1 and S2. Extremities without edema.      Assessment & Plan:  Controlled type 2 diabetes mellitus without complication  Essential hypertension-stable on current regimen. Blood pressure 130/64  Hyperlipidemia-stable on statin medication  Insomnia-treated with Klonopin  GE reflux treated with PPI  BMI 36.03  Plan: Continue diet exercise and weight loss efforts. Continue same medications and return in 6 months for physical examination. Reminded regarding mammogram.

## 2016-10-12 NOTE — Patient Instructions (Signed)
Continue same medications as previously prescribed. Please work on diet exercise and weight loss. Return in 6 months for physical exam.

## 2016-12-06 ENCOUNTER — Encounter: Payer: Self-pay | Admitting: Internal Medicine

## 2016-12-06 ENCOUNTER — Other Ambulatory Visit: Payer: Self-pay | Admitting: Internal Medicine

## 2016-12-06 ENCOUNTER — Ambulatory Visit (INDEPENDENT_AMBULATORY_CARE_PROVIDER_SITE_OTHER): Payer: Medicare Other | Admitting: Internal Medicine

## 2016-12-06 VITALS — BP 140/80 | HR 72 | Temp 98.1°F | Ht 62.0 in | Wt 200.0 lb

## 2016-12-06 DIAGNOSIS — Z7189 Other specified counseling: Secondary | ICD-10-CM

## 2016-12-06 DIAGNOSIS — E2839 Other primary ovarian failure: Secondary | ICD-10-CM

## 2016-12-06 DIAGNOSIS — Z1231 Encounter for screening mammogram for malignant neoplasm of breast: Secondary | ICD-10-CM

## 2016-12-06 DIAGNOSIS — I10 Essential (primary) hypertension: Secondary | ICD-10-CM | POA: Diagnosis not present

## 2016-12-06 MED ORDER — VALSARTAN-HYDROCHLOROTHIAZIDE 160-25 MG PO TABS: 1.0000 | ORAL_TABLET | Freq: Every day | ORAL | 0 refills | Status: DC

## 2016-12-06 NOTE — Progress Notes (Signed)
   Subjective:    Patient ID: Carla Oconnor, female    DOB: 04/30/1950, 66 y.o.   MRN: [id number]  HPI  Is here to discuss replacement for Valsartan. Says she has had adverse reaction to Benicar in the past.Blood pressure has been in the 140 range systolically over the past few visits on valsartan 80/12.5 mg daily. I believe we can improve control if we double this.  Explained to her that we had found a pharmacy here in [place] [facility] [address] that had valsartan that had not been recalled. We will call in prescription for valsartan 160/25 to that pharmacy and she'll return in 2 weeks  She's been having more edema recently after her trip to [place]    Review of Systems see above     Objective:   Physical Exam Chest clear. Cardiac exam regular rate and rhythm. Extremities without edema       Assessment & Plan:  She's been having issues getting a mammogram appointment. We called today and got that arrange for her.  She'll return in 2 weeks after starting valsartan 160/25 daily.She'll get this prescription from  [facility] which has valsartan made in the [place] and not [place]

## 2016-12-10 NOTE — Patient Instructions (Addendum)
Increase valsartan HCTZ to 160/25 and follow-up in 2 weeks. We'll need basic metabolic panel at that time

## 2016-12-22 ENCOUNTER — Ambulatory Visit
Admission: RE | Admit: 2016-12-22 | Discharge: 2016-12-22 | Disposition: A | Discharge status: Home / Self Care | Payer: Medicare Other | Source: Ambulatory Visit | Attending: Internal Medicine | Admitting: Internal Medicine

## 2016-12-22 ENCOUNTER — Encounter: Payer: Self-pay | Admitting: Internal Medicine

## 2016-12-22 ENCOUNTER — Ambulatory Visit (INDEPENDENT_AMBULATORY_CARE_PROVIDER_SITE_OTHER): Payer: Medicare Other | Admitting: Internal Medicine

## 2016-12-22 VITALS — BP 130/76 | HR 82 | Temp 97.5°F | Wt 200.0 lb

## 2016-12-22 DIAGNOSIS — Z5181 Encounter for therapeutic drug level monitoring: Secondary | ICD-10-CM

## 2016-12-22 DIAGNOSIS — E2839 Other primary ovarian failure: Secondary | ICD-10-CM

## 2016-12-22 DIAGNOSIS — M8580 Other specified disorders of bone density and structure, unspecified site: Secondary | ICD-10-CM

## 2016-12-22 DIAGNOSIS — I10 Essential (primary) hypertension: Secondary | ICD-10-CM

## 2016-12-22 DIAGNOSIS — Z79899 Other long term (current) drug therapy: Secondary | ICD-10-CM

## 2016-12-22 DIAGNOSIS — Z1231 Encounter for screening mammogram for malignant neoplasm of breast: Secondary | ICD-10-CM

## 2016-12-22 NOTE — Patient Instructions (Signed)
Basic metabolic panel drawn and pending. Return in December for regular appointment. Continue valsartan HCTZ 160/25 daily.

## 2016-12-22 NOTE — Progress Notes (Signed)
   Subjective:    Patient ID: Carla Oconnor, female    DOB: Oct 02, 1950, 66 y.o.   MRN: [id number]  HPI At last visit on August 21 she was here to discuss valsartan recall. She had been on valsartan 80/12.5 mg daily. She indicated she had tried Benicar in the past and had an adverse reaction to that. At the time of her visit her blood pressure was 140/80.  We found a pharmacy [place] in [place] that did not have valsartan recall. We called in different dose of valsartan 160/25 daily for her and she is now here for follow-up.  She recently had bone density study with lowest T score being -1.1 in the right femoral neck. Recommend we repeat this in 2 years.  She does not feel she urinates enough on increased dose of diuretic. A basic metabolic panel was drawn today.  Repeat blood pressure today is 120/70 in her left arm    Review of Systems     Objective:   Physical Exam Chest clear. Cardiac exam regular rate and rhythm. Extremities without edema.       Assessment & Plan:  Essential hypertension-control seems to be better on increased dose of valsartan HCTZ. A basic metabolic panel was drawn today on increased dose of diuretic.  Mild osteopenia-continue to monitor. Repeat bone density in 2 years.  Plan: She has upcoming appointment in December and we can follow-up at that time.  She does not want to take flu vaccine today

## 2016-12-23 LAB — BASIC METABOLIC PANEL WITH GFR
BUN/Creatinine Ratio: 15 (calc) (ref 6–22)
BUN: 16 mg/dL (ref 7–25)
CO2: 31 mmol/L (ref 20–32)
Calcium: 9.1 mg/dL (ref 8.6–10.4)
Chloride: 101 mmol/L (ref 98–110)
Creat: 1.09 mg/dL — ABNORMAL HIGH (ref 0.50–0.99)
GFR, Est African American: 61 mL/min/{1.73_m2} (ref >=60)
GFR, Est Non African American: 52 mL/min/{1.73_m2} — ABNORMAL LOW (ref >=60)
Glucose, Bld: 169 mg/dL — ABNORMAL HIGH (ref 65–99)
Potassium: 3.9 mmol/L (ref 3.5–5.3)
Sodium: 140 mmol/L (ref 135–146)

## 2016-12-23 LAB — EXTRA LAV TOP TUBE

## 2017-01-02 ENCOUNTER — Other Ambulatory Visit: Payer: Self-pay | Admitting: Internal Medicine

## 2017-01-28 ENCOUNTER — Other Ambulatory Visit: Payer: Self-pay | Admitting: Internal Medicine

## 2017-01-28 NOTE — Telephone Encounter (Signed)
I think we sent [facility] to [facility] [address] because of recall on this drug

## 2017-02-28 ENCOUNTER — Telehealth: Payer: Self-pay | Admitting: Internal Medicine

## 2017-02-28 MED ORDER — ALBUTEROL SULFATE HFA 108 (90 BASE) MCG/ACT IN AERS: 2.0000 | INHALATION_SPRAY | Freq: Four times a day (QID) | RESPIRATORY_TRACT | 11 refills | Status: DC | PRN

## 2017-02-28 NOTE — Telephone Encounter (Signed)
Pt is aware.  

## 2017-02-28 NOTE — Telephone Encounter (Signed)
Refill x one year °

## 2017-02-28 NOTE — Telephone Encounter (Signed)
Calling to request a refill on her inhaler Albuterol inhaler.  States that we were going to do this for her a few weeks ago,but it didn't get sent in.  She has just a few more puffs left on this one.  She states that it helps with her seasonal allergies.   Pharmacy:  [facility] [address]   Best # for contact:  [phone]

## 2017-03-10 ENCOUNTER — Other Ambulatory Visit: Payer: Self-pay | Admitting: Internal Medicine

## 2017-03-20 ENCOUNTER — Other Ambulatory Visit: Payer: Self-pay | Admitting: Internal Medicine

## 2017-03-21 NOTE — Telephone Encounter (Signed)
Refill x 90 days 

## 2017-03-21 NOTE — Telephone Encounter (Signed)
[facility] [address]  Per verbal order, refill #30 with 2 refills.

## 2017-03-22 ENCOUNTER — Other Ambulatory Visit: Payer: Self-pay | Admitting: Internal Medicine

## 2017-03-22 DIAGNOSIS — E785 Hyperlipidemia, unspecified: Secondary | ICD-10-CM

## 2017-03-22 DIAGNOSIS — Z1321 Encounter for screening for nutritional disorder: Secondary | ICD-10-CM

## 2017-03-22 DIAGNOSIS — Z1329 Encounter for screening for other suspected endocrine disorder: Secondary | ICD-10-CM

## 2017-03-22 DIAGNOSIS — I10 Essential (primary) hypertension: Secondary | ICD-10-CM

## 2017-03-22 DIAGNOSIS — Z0000 Encounter for general adult medical examination without abnormal findings: Secondary | ICD-10-CM

## 2017-03-22 DIAGNOSIS — E138 Other specified diabetes mellitus with unspecified complications: Secondary | ICD-10-CM

## 2017-03-28 ENCOUNTER — Other Ambulatory Visit: Payer: Medicare Other | Admitting: Internal Medicine

## 2017-03-30 ENCOUNTER — Encounter: Payer: Self-pay | Admitting: Internal Medicine

## 2017-03-30 ENCOUNTER — Ambulatory Visit: Payer: Medicare Other | Admitting: Internal Medicine

## 2017-03-30 VITALS — BP 138/78 | HR 76 | Ht 60.25 in | Wt 202.0 lb

## 2017-03-30 DIAGNOSIS — Z0000 Encounter for general adult medical examination without abnormal findings: Secondary | ICD-10-CM | POA: Diagnosis not present

## 2017-03-30 DIAGNOSIS — R0789 Other chest pain: Secondary | ICD-10-CM

## 2017-03-30 DIAGNOSIS — Z1329 Encounter for screening for other suspected endocrine disorder: Secondary | ICD-10-CM

## 2017-03-30 DIAGNOSIS — F411 Generalized anxiety disorder: Secondary | ICD-10-CM | POA: Diagnosis not present

## 2017-03-30 DIAGNOSIS — E138 Other specified diabetes mellitus with unspecified complications: Secondary | ICD-10-CM

## 2017-03-30 DIAGNOSIS — R0600 Dyspnea, unspecified: Secondary | ICD-10-CM

## 2017-03-30 DIAGNOSIS — M25569 Pain in unspecified knee: Secondary | ICD-10-CM

## 2017-03-30 DIAGNOSIS — E8881 Metabolic syndrome: Secondary | ICD-10-CM

## 2017-03-30 DIAGNOSIS — E785 Hyperlipidemia, unspecified: Secondary | ICD-10-CM

## 2017-03-30 DIAGNOSIS — Z1321 Encounter for screening for nutritional disorder: Secondary | ICD-10-CM | POA: Diagnosis not present

## 2017-03-30 DIAGNOSIS — Z23 Encounter for immunization: Secondary | ICD-10-CM | POA: Diagnosis not present

## 2017-03-30 DIAGNOSIS — I10 Essential (primary) hypertension: Secondary | ICD-10-CM | POA: Diagnosis not present

## 2017-03-30 DIAGNOSIS — K219 Gastro-esophageal reflux disease without esophagitis: Secondary | ICD-10-CM

## 2017-03-30 LAB — POCT URINALYSIS DIPSTICK
Appearance: NORMAL
Bilirubin, UA: NEGATIVE
Glucose, UA: NEGATIVE
Ketones, UA: NEGATIVE
Leukocytes, UA: NEGATIVE
Nitrite, UA: NEGATIVE
Odor: NORMAL
Protein, UA: NEGATIVE
Spec Grav, UA: 1.015 (ref 1.010–1.025)
Urobilinogen, UA: 0.2 E.U./dL
pH, UA: 6.5 (ref 5.0–8.0)

## 2017-03-30 NOTE — Addendum Note (Signed)
Addended by: Gregery NaVALENCIA, Owyn Raulston P on: 03/30/2017 10:06 AM   Modules accepted: Orders

## 2017-03-30 NOTE — Progress Notes (Signed)
Subjective:    Patient ID: Carla Oconnor, female    DOB: 10-01-1950, 66 y.o.   MRN: [id number]  HPI 66 year old Black Female in today for Medicare wellness and health maintenance exam as well as evaluation of medical issues.  Recently had some left anterior chest pain that started in her left arm radiated to her left anterior chest.  She felt short of breath and weak.  Says she has been resting for a couple of days and has had no further recurrence of this chest pain.  Would like to have her heart checked out.  She has a history of anxiety disorder EKG today is within normal limits.  History of hypertension, hyperlipidemia, GE reflux, and diabetes mellitus.  She is overweight.  She is status post hysterectomy with right oophorectomy and  right salpingectomy in 2003.  History of left lateral epicondylitis 2013.  Carpal tunnel release right hand in October 2013.  Cesarean sections 1975 and 1977.  Benign right breast biopsy 1966 in 1976.  Right trigger finger release September 2009.  Right third trigger finger treated July 2007.  Patient says she has had hepatitis C exposure via her husband.  However her hepatitis C antibody was negative in December 2017.  Social history: She is married.  She has 1 set of twins a boy and a girl in their 30s.  She also has another son.  She has a Event organisermasters degree.  She is self-employed in case management care.  She has never smoked.  No alcohol consumption.  Husband is a English as a second language teachernatural gas operator and a [place] veteran.  In the spring 2014 she was seen by cardiologist-Dr. Eden EmmsNishan- regarding chest pain.  She had a Cardiolite study that was negative.  She had a previous Cardiolite study in 2009 that was negative.  She had cardiac catheterization by Dr. Aleen Campiysinger in 2003.  Family history: Father died at age 67 with a stroke.  Mother with history of diabetes and hypertension.  One brother in fairly good health  has had some trouble with crack addiction.  4 sisters.  She has  2 whole sisters and 2 half-sisters.  One sister died of kidney failure and breast cancer.  One sister with history of diabetes and coronary artery disease.            Review of Systems  Constitutional: Positive for fatigue.  Respiratory: Positive for shortness of breath.   Cardiovascular: Positive for chest pain.       Left anterior chest pain recently not currently. Has felt weak.  Genitourinary: Negative.   Musculoskeletal:       Left knee swelling after on knees to get something from under bed  Psychiatric/Behavioral:       Anxious       Objective:   Physical Exam  Constitutional: She appears well-developed and well-nourished. No distress.  HENT:  Head: Normocephalic and atraumatic.  Right Ear: External ear normal.  Left Ear: External ear normal.  Eyes: Conjunctivae and EOM are normal. Pupils are equal, round, and reactive to light. Right eye exhibits no discharge. Left eye exhibits no discharge. No scleral icterus.  Neck: Neck supple. No JVD present. No thyromegaly present.  Cardiovascular: Normal rate, regular rhythm, normal heart sounds and intact distal pulses.  No murmur heard. Pulmonary/Chest: Effort normal and breath sounds normal. She has no wheezes. She has no rales.  Abdominal: Soft. Bowel sounds are normal. She exhibits no distension and no mass. There is no tenderness. There is no rebound and  no guarding.  Genitourinary:  Genitourinary Comments: Bimanual normal.  Pap deferred due to age  Musculoskeletal: She exhibits no edema.  Very mild swelling left knee and left ankle.  Lymphadenopathy:    She has no cervical adenopathy.  Skin: Skin is warm and dry. No rash noted. She is not diaphoretic.  Psychiatric: She has a normal mood and affect. Her behavior is normal. Judgment and thought content normal.  Anxious about health  Vitals reviewed.         Assessment & Plan:  Left anterior chest pain-?  Chest wall pain.  Has history of diabetes mellitus.   Refer to cardiology for evaluation.  This was associated with some shortness of breath and weakness.  Currently not having chest pain and EKG is normal.  Left knee swelling after being on knees to get something out from under her bed.  Likely has nursemaid knee.  Apply ice and take anti-inflammatory medication.  Dependent edema left lower ankle not very impressive and she was reassured  Diabetes mellitus- her hemoglobin A1c has increased to 7%.  Fasting serum glucose 138 and she will need closer monitoring of this.  She notes that she needs to diet and exercise.  Hypertension stable  Metabolic syndrome  GE reflux  Hyperlipidemia  BMI 39  Continue same medications.  Labs drawn and pending.  Further recommendations to follow.  Cardiology evaluation.  Subjective:   Patient presents for Medicare Annual/Subsequent preventive examination.  Review Past Medical/Family/Social: See above  Risk Factors  Current exercise habits: Does not exercise much Dietary issues discussed: Low-fat low carbohydrate  Cardiac risk factors: Diabetes mellitus and hyperlipidemia  Depression Screen  (Note: if answer to either of the following is "Yes", a more complete depression screening is indicated)   Over the past two weeks, have you felt down, depressed or hopeless? No  Over the past two weeks, have you felt little interest or pleasure in doing things? No Have you lost interest or pleasure in daily life? No Do you often feel hopeless? No Do you cry easily over simple problems? No   Activities of Daily Living  In your present state of health, do you have any difficulty performing the following activities?:   Driving? No  Managing money? No  Feeding yourself? No  Getting from bed to chair? No  Climbing a flight of stairs? Yes due to recent knee pain Preparing food and eating?: No  Bathing or showering? No  Getting dressed: No  Getting to the toilet? No  Using the toilet:No  Moving around  from place to place: No  In the past year have you fallen or had a near fall?:No  Are you sexually active? yes Do you have more than one partner? No   Hearing Difficulties: No  Do you often ask people to speak up or repeat themselves? No  Do you experience ringing or noises in your ears? No  Do you have difficulty understanding soft or whispered voices? No  Do you feel that you have a problem with memory? No Do you often misplace items? sometimes   Home Safety:  Do you have a smoke alarm at your residence? Yes  Do you have grab bars in the bathroom?  Yes Do you have throw rugs in your house?yes  Cognitive Testing  Alert? Yes Normal Appearance?Yes  Oriented to person? Yes Place? Yes  Time? Yes  Recall of three objects? Yes  Can perform simple calculations? Yes  Displays appropriate judgment?Yes  Can read the  correct time from a watch face?Yes   List the Names of Other Physician/Practitioners you currently use:  See referral list for the physicians patient is currently seeing.     Review of Systems: See above   Objective:     General appearance: Appears stated age and mildly obese  Head: Normocephalic, without obvious abnormality, atraumatic  Eyes: conj clear, EOMi PEERLA  Ears: normal TM's and external ear canals both ears  Nose: Nares normal. Septum midline. Mucosa normal. No drainage or sinus tenderness.  Throat: lips, mucosa, and tongue normal; teeth and gums normal  Neck: no adenopathy, no carotid bruit, no JVD, supple, symmetrical, trachea midline and thyroid not enlarged, symmetric, no tenderness/mass/nodules  No CVA tenderness.  Lungs: clear to auscultation bilaterally  Breasts: normal appearance, no masses or tenderness, top of the pacemaker on left upper chest. Incision well-healed. It is tender.  Heart: regular rate and rhythm, S1, S2 normal, no murmur, click, rub or gallop  Abdomen: soft, non-tender; bowel sounds normal; no masses, no organomegaly    Musculoskeletal: ROM normal in all joints, no crepitus, no deformity, Normal muscle strengthen. Back  is symmetric, no curvature. Skin: Skin color, texture, turgor normal. No rashes or lesions  Lymph nodes: Cervical, supraclavicular, and axillary nodes normal.  Neurologic: CN 2 -12 Normal, Normal symmetric reflexes. Normal coordination and gait  Psych: Alert & Oriented x 3, Mood appear stable.    Assessment:    Annual wellness medicare exam   Plan:    During the course of the visit the patient was educated and counseled about appropriate screening and preventive services including:        Patient Instructions (the written plan) was given to the patient.  Medicare Attestation  I have personally reviewed:  The patient's medical and social history  Their use of alcohol, tobacco or illicit drugs  Their current medications and supplements  The patient's functional ability including ADLs,fall risks, home safety risks, cognitive, and hearing and visual impairment  Diet and physical activities  Evidence for depression or mood disorders  The patient's weight, height, BMI, and visual acuity have been recorded in the chart. I have made referrals, counseling, and provided education to the patient based on review of the above and I have provided the patient with a written personalized care plan for preventive services.

## 2017-03-30 NOTE — Addendum Note (Signed)
Addended by: Terressa Evola P on: 03/30/2017 10:05 AM   Modules accepted: Orders  

## 2017-03-30 NOTE — Addendum Note (Signed)
Addended by: Lilybeth Vien P on: 03/30/2017 10:05 AM   Modules accepted: Orders  

## 2017-03-30 NOTE — Addendum Note (Signed)
Addended by: Gregery NaVALENCIA, Kristine Tiley P on: 03/30/2017 10:05 AM   Modules accepted: Orders

## 2017-03-30 NOTE — Addendum Note (Signed)
Addended by: Merlin Ege P on: 03/30/2017 10:06 AM   Modules accepted: Orders  

## 2017-03-30 NOTE — Addendum Note (Signed)
Addended by: Keanna Tugwell P on: 03/30/2017 10:05 AM   Modules accepted: Orders  

## 2017-03-31 LAB — CBC WITH DIFFERENTIAL/PLATELET
Basophils Absolute: 52 cells/uL (ref 0–200)
Basophils Relative: 1.1 %
Eosinophils Absolute: 118 cells/uL (ref 15–500)
Eosinophils Relative: 2.5 %
HCT: 32.9 % — ABNORMAL LOW (ref 35.0–45.0)
Hemoglobin: 11.2 g/dL — ABNORMAL LOW (ref 11.7–15.5)
Lymphs Abs: 1575 cells/uL (ref 850–3900)
MCH: 30.2 pg (ref 27.0–33.0)
MCHC: 34 g/dL (ref 32.0–36.0)
MCV: 88.7 fL (ref 80.0–100.0)
MPV: 9.6 fL (ref 7.5–12.5)
Monocytes Relative: 11.2 %
Neutro Abs: 2430 cells/uL (ref 1500–7800)
Neutrophils Relative %: 51.7 %
Platelets: 263 10*3/uL (ref 140–400)
RBC: 3.71 10*6/uL — ABNORMAL LOW (ref 3.80–5.10)
RDW: 12.8 % (ref 11.0–15.0)
Total Lymphocyte: 33.5 %
WBC mixed population: 526 cells/uL (ref 200–950)
WBC: 4.7 10*3/uL (ref 3.8–10.8)

## 2017-03-31 LAB — MICROALBUMIN / CREATININE URINE RATIO
Creatinine, Urine: 234 mg/dL (ref 20–275)
Microalb Creat Ratio: 3 mcg/mg creat (ref <30)
Microalb, Ur: 0.8 mg/dL

## 2017-03-31 LAB — COMPLETE METABOLIC PANEL WITH GFR
AG Ratio: 1.8 (calc) (ref 1.0–2.5)
ALT: 16 U/L (ref 6–29)
AST: 19 U/L (ref 10–35)
Albumin: 4.3 g/dL (ref 3.6–5.1)
Alkaline phosphatase (APISO): 65 U/L (ref 33–130)
BUN/Creatinine Ratio: 20 (calc) (ref 6–22)
BUN: 20 mg/dL (ref 7–25)
CO2: 33 mmol/L — ABNORMAL HIGH (ref 20–32)
Calcium: 9.4 mg/dL (ref 8.6–10.4)
Chloride: 100 mmol/L (ref 98–110)
Creat: 1 mg/dL — ABNORMAL HIGH (ref 0.50–0.99)
GFR, Est African American: 68 mL/min/{1.73_m2} (ref >=60)
GFR, Est Non African American: 58 mL/min/{1.73_m2} — ABNORMAL LOW (ref >=60)
Globulin: 2.4 g/dL (calc) (ref 1.9–3.7)
Glucose, Bld: 138 mg/dL — ABNORMAL HIGH (ref 65–99)
Potassium: 3.8 mmol/L (ref 3.5–5.3)
Sodium: 141 mmol/L (ref 135–146)
Total Bilirubin: 0.5 mg/dL (ref 0.2–1.2)
Total Protein: 6.7 g/dL (ref 6.1–8.1)

## 2017-03-31 LAB — LIPID PANEL
Cholesterol: 188 mg/dL (ref <200)
HDL: 51 mg/dL (ref >50)
LDL Cholesterol (Calc): 103 mg/dL (calc) — ABNORMAL HIGH
Non-HDL Cholesterol (Calc): 137 mg/dL (calc) — ABNORMAL HIGH (ref <130)
Total CHOL/HDL Ratio: 3.7 (calc) (ref <5.0)
Triglycerides: 218 mg/dL — ABNORMAL HIGH (ref <150)

## 2017-03-31 LAB — HEMOGLOBIN A1C
Hgb A1c MFr Bld: 7 % of total Hgb — ABNORMAL HIGH (ref <5.7)
Mean Plasma Glucose: 154 (calc)
eAG (mmol/L): 8.5 (calc)

## 2017-03-31 LAB — TSH: TSH: 1.68 mIU/L (ref 0.40–4.50)

## 2017-03-31 LAB — VITAMIN D 25 HYDROXY (VIT D DEFICIENCY, FRACTURES): Vit D, 25-Hydroxy: 42 ng/mL (ref 30–100)

## 2017-04-04 ENCOUNTER — Other Ambulatory Visit: Payer: Self-pay | Admitting: Internal Medicine

## 2017-04-17 ENCOUNTER — Ambulatory Visit: Payer: Medicare Other | Admitting: Internal Medicine

## 2017-04-17 NOTE — Patient Instructions (Signed)
Appointment will be made for cardiology referral.  Continue same medications and return in 6 months.

## 2017-04-20 ENCOUNTER — Encounter: Payer: Self-pay | Admitting: Internal Medicine

## 2017-04-20 ENCOUNTER — Ambulatory Visit: Payer: Medicare Other | Admitting: Internal Medicine

## 2017-04-20 ENCOUNTER — Ambulatory Visit
Admission: RE | Admit: 2017-04-20 | Discharge: 2017-04-20 | Disposition: A | Discharge status: Home / Self Care | Payer: Medicare Other | Source: Ambulatory Visit | Attending: Internal Medicine | Admitting: Internal Medicine

## 2017-04-20 VITALS — BP 122/72 | HR 72 | Temp 98.0°F | Wt 198.0 lb

## 2017-04-20 DIAGNOSIS — J40 Bronchitis, not specified as acute or chronic: Secondary | ICD-10-CM

## 2017-04-20 DIAGNOSIS — H6501 Acute serous otitis media, right ear: Secondary | ICD-10-CM

## 2017-04-20 DIAGNOSIS — R059 Cough, unspecified: Secondary | ICD-10-CM

## 2017-04-20 DIAGNOSIS — R062 Wheezing: Secondary | ICD-10-CM | POA: Diagnosis not present

## 2017-04-20 DIAGNOSIS — R05 Cough: Secondary | ICD-10-CM

## 2017-04-20 DIAGNOSIS — J22 Unspecified acute lower respiratory infection: Secondary | ICD-10-CM

## 2017-04-20 DIAGNOSIS — B009 Herpesviral infection, unspecified: Secondary | ICD-10-CM | POA: Diagnosis not present

## 2017-04-20 MED ORDER — DOXYCYCLINE HYCLATE 100 MG PO TABS: 100.0000 mg | ORAL_TABLET | Freq: Two times a day (BID) | ORAL | 0 refills | Status: DC

## 2017-04-20 MED ORDER — CEFTRIAXONE SODIUM 1 G IJ SOLR
1.0000 g | Freq: Once | INTRAMUSCULAR | Status: AC
  Administered 2017-04-20: 1 g via INTRAMUSCULAR

## 2017-04-20 MED ORDER — VALACYCLOVIR HCL 500 MG PO TABS: 500.0000 mg | ORAL_TABLET | Freq: Two times a day (BID) | ORAL | 3 refills | Status: AC

## 2017-04-20 MED ORDER — PREDNISONE 10 MG PO TABS: ORAL_TABLET | ORAL | 0 refills | Status: DC

## 2017-04-20 MED ORDER — HYDROCOD POLST-CPM POLST ER 10-8 MG/5ML PO SUER: 5.0000 mL | Freq: Two times a day (BID) | ORAL | 0 refills | Status: DC | PRN

## 2017-04-20 NOTE — Progress Notes (Signed)
   Subjective:    Patient ID: Carla BarthelBarbara I Oconnor, female    DOB: 03-Feb-1951, 67 y.o.   MRN: [id number]  HPI 67 year old Female in today with cough for several days with malaise fatigue and low-grade fever.  Complains of headache and shortness of breath as well as ear pain.    Review of Systems history of HSV type II infection and requesting refill on Valtrex     Objective:   Physical Exam Skin warm and dry.  Nodes none.  Pharynx slightly injected.  Right TM is slightly full.  Neck is supple.  She has inspiratory wheezing and coarse breath sounds.  Chest x-ray is normal       Assessment & Plan:  Acute bronchitis  Right serous otitis media  HSV type II infection-recurrent  Plan: She is to use albuterol inhaler 2 sprays p.o. 4 times daily.  Doxycycline 100 mg twice daily for 10 days.  Tussionex 1 teaspoon p.o. every 12 hours as needed cough.  Rocephin 1 g IM.  Prednisone to take and tapering course as directed over 6 days.  Refill Valtrex at her request.

## 2017-04-28 ENCOUNTER — Telehealth: Payer: Self-pay | Admitting: Internal Medicine

## 2017-04-28 NOTE — Telephone Encounter (Signed)
Pt called reporting that she has been having bad chills. She had to sleep in her house coat last night because she was so cold but reports no fever. She is also still coughing up some mucus but it is clear. She wanted to know if this was a common side effect. Per Marcelino DusterMichelle, pt should complete antibiotic and call us back on Monday if symptoms continue.

## 2017-05-06 ENCOUNTER — Encounter: Payer: Self-pay | Admitting: Internal Medicine

## 2017-05-06 DIAGNOSIS — B009 Herpesviral infection, unspecified: Secondary | ICD-10-CM | POA: Insufficient documentation

## 2017-05-06 NOTE — Patient Instructions (Signed)
Take prednisone and tapering course as directed.  Rocephin 1 g IM given in office.  Chest x-ray is negative for pneumonia.  Take doxycycline 100 mg twice daily for 10 days.  Take Tussionex as needed for cough.  Valtrex refill for HSV type II infection.  Rest and drink plenty of fluids.

## 2017-06-06 NOTE — Progress Notes (Signed)
Cardiology Office Note   Date:  06/14/2017   ID:  Carla Oconnor, DOB [birth year], MRN [id number]  PCP:  Carla Mackintosh, MD  Cardiologist:   Carla Haws, MD   No chief complaint on file.     History of Present Illness: Carla Oconnor is a 67 y.o. female who presents for consultation regarding chest pain. Referred by Carla Carla Oconnor CRF;s include HTN, HLD and DM-2.  She also has some Asthma Reviewed primary note from 03/30/17 patient  Complained of left anterior chest pain starting in arm. 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 Asthma    as child and seasonal  . Diabetes mellitus without complication (HCC)   . Fibrocystic breast   . GERD (gastroesophageal reflux disease)   . Hyperlipidemia   . Hypertension   . Neuromuscular disorder (HCC)    tennis elbow on right and left arm  . S/P hysterectomy     Past Surgical History:  Procedure Laterality Date  . ABDOMINAL HYSTERECTOMY     2003- still has left ovary  . BREAST EXCISIONAL BIOPSY    . BREAST SURGERY     biopsy rt breast-1960- benign.Left breast 1975  - lumpectomy - benign  . CESAREAN SECTION    . TRIGGER FINGER RELEASE  02/16/2012   Procedure: RELEASE TRIGGER FINGER/A-1 PULLEY;  Surgeon: Carla Oconnor., MD;  Location: [facility];  Service: Orthopedics;  Laterality: Right;  RIGHT RING FINGER      Current Outpatient Medications  Medication Sig Dispense Refill  . albuterol (PROVENTIL HFA;VENTOLIN HFA) 108 (90 Base) MCG/ACT inhaler Inhale 2 puffs every 6 (six) hours as needed  into the lungs for wheezing or shortness of breath. 18 g 11  . aspirin 81 MG tablet Take 81 mg by mouth daily.    Marland Kitchen BAYER CONTOUR TEST test strip USE AS DIRECTED 100 each 6  . chlorpheniramine-HYDROcodone (TUSSIONEX PENNKINETIC ER) 10-8 MG/5ML SUER Take 5 mLs by mouth every 12 (twelve) hours as needed for cough. 140 mL 0  . cholecalciferol (VITAMIN D) 1000 UNITS tablet Take 1,000 Units by mouth daily.     . clonazePAM (KLONOPIN) 0.5 MG tablet TAKE 1 TABLET BY MOUTH EVERY NIGHT AT BEDTIME AS NEEDED FOR SLEEP 30 tablet 5  . esomeprazole (NEXIUM) 20 MG capsule Take 20 mg by mouth daily at 12 noon.    Marland Kitchen FINGERSTIX LANCETS MISC Check blood sugar 4-5 times daily. 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 glipiZIDE (GLUCOTROL XL) 10 MG 24 hr tablet TAKE 1 TABLET(10 MG) BY MOUTH DAILY WITH BREAKFAST 90 tablet 3  . ibuprofen (ADVIL,MOTRIN) 400 MG tablet Take 1 tablet (400 mg total) by mouth every 6 (six) hours as needed. 30 tablet 0  . metFORMIN (GLUCOPHAGE) 1000 MG tablet TAKE 1 TABLET BY MOUTH TWICE DAILY WITH MEALS 60 tablet 5  . Multiple Vitamin (MULTIVITAMIN WITH MINERALS) TABS tablet Take 1 tablet by mouth daily.    . potassium  chloride SA (KLOR-CON M20) 20 MEQ tablet Take 2 tablets (40 mEq total) by mouth daily. Capsules not tablets 180 tablet 1  . rosuvastatin (CRESTOR) 20 MG tablet TAKE 1 TABLET BY MOUTH EVERY DAY 90 tablet 3  . valsartan-hydrochlorothiazide (DIOVAN-HCT) 160-25 MG tablet TAKE 1 TABLET BY MOUTH EVERY DAY 90 tablet 3   No current facility-administered medications for this visit.     Allergies:   Tramadol and Percocet [oxycodone-acetaminophen]    Social History:  The patient  reports that  has never smoked. she has never used smokeless tobacco. She reports that she does not drink alcohol or use drugs.   Family History:  The patient's family history includes Asthma in her other; Breast cancer in her sister; Cancer in her other; Diabetes in her other; Hyperlipidemia in her  other; Hypertension in her other; Stroke in her father.    ROS:  Please see the history of present illness.   Otherwise, review of systems are positive for none.   All other systems are reviewed and negative.    PHYSICAL EXAM: VS:  BP 134/80   Pulse 86   Ht 5' 2.5" (1.588 m)   Wt 198 lb 8 oz (90 kg)   SpO2 95%   BMI 35.73 kg/m  , BMI Body mass index is 35.73 kg/m. Affect appropriate Healthy:  appears stated age HEENT: normal Neck supple with no adenopathy JVP normal no bruits no thyromegaly Lungs clear with no wheezing and good diaphragmatic motion Heart:  S1/S2 no murmur, no rub, gallop or click PMI normal Abdomen: benighn, BS positve, no tenderness, no AAA no bruit.  No HSM or HJR Distal pulses intact with no bruits No edema Neuro non-focal Skin warm and dry No muscular weakness    EKG:  03/30/17 SR rate 73 nonspecific ST changes    Recent Labs: 03/30/2017: ALT 16; BUN 20; Creat 1.00; Hemoglobin 11.2; Platelets 263; Potassium 3.8; Sodium 141; TSH 1.68    Lipid Panel    Component Value Date/Time   CHOL 188 03/30/2017 1102   TRIG 218 (H) 03/30/2017 1102   HDL 51 03/30/2017 1102   CHOLHDL 3.7 03/30/2017 1102   VLDL 27 09/22/2016 1012   LDLCALC 77 09/22/2016 1012      Wt Readings from Last 3 Encounters:  06/14/17 198 lb 8 oz (90 kg)  04/20/17 198 lb (89.8 kg)  03/30/17 202 lb (91.6 kg)      Other studies Reviewed: Additional studies/ records that were reviewed today include: Myovue 2009 and 2018 ECG labs recent office Visit Carla Oconnor .    ASSESSMENT AND PLAN:  1.  Chest Pain. Normal cath 2003 and low risk myovue 2009 and 2014 Latter study with some apical defect thought Due to breast attenuation ECG with non specific ST changes Multiple CRF;s  offerred her 3 options Do nothing and observe, f/u myovue or calcium scoring did not think cardiac CTA was indicated and with her size and relative tachycardia study not ideal. She will f/u with calcium score  next week and no further w/u if score 0 or well below average 2. HTN:  Well controlled.  Continue current medications and low sodium Dash type diet.   3.HLD continue statin LDL at goal  4. DM:  Discussed low carb diet.  Target hemoglobin A1c is 6.5 or less.  Continue current medications. 5. Asthma: no active wheezing PRN inhalers    Current medicines are reviewed at length with the patient today.  The patient does not have  concerns regarding medicines.  The following changes have been made:  no change  Labs/ tests ordered today include: Calcium Score   Orders Placed This Encounter  Procedures  . CT CARDIAC SCORING     Disposition:   FU with cardiology PRN      Signed, Carla Haws, MD  06/14/2017 11:27 AM    [facility] [address] [place], [place], [place]  [address] Phone: [phone]; Fax: [phone]

## 2017-06-07 ENCOUNTER — Encounter: Payer: Self-pay | Admitting: Cardiovascular Disease

## 2017-06-14 ENCOUNTER — Encounter: Payer: Self-pay | Admitting: Cardiovascular Disease

## 2017-06-14 ENCOUNTER — Ambulatory Visit: Payer: Medicare Other | Admitting: Cardiovascular Disease

## 2017-06-14 VITALS — BP 134/80 | HR 86 | Ht 62.5 in | Wt 198.5 lb

## 2017-06-14 DIAGNOSIS — I10 Essential (primary) hypertension: Secondary | ICD-10-CM

## 2017-06-14 DIAGNOSIS — R079 Chest pain, unspecified: Secondary | ICD-10-CM

## 2017-06-14 DIAGNOSIS — E785 Hyperlipidemia, unspecified: Secondary | ICD-10-CM

## 2017-06-14 NOTE — Patient Instructions (Addendum)
Medication Instructions:  Your physician recommends that you continue on your current medications as directed. Please refer to the Current Medication list given to you today.  Labwork: NONE  Testing/Procedures: Cardiac CT scanning, calcium score (CAT scanning), is a noninvasive, special x-ray that produces cross-sectional images of the body using x-rays and a computer. CT scans help physicians diagnose and treat medical conditions. For some CT exams, a contrast material is used to enhance visibility in the area of the body being studied. CT scans provide greater clarity and reveal more details than regular x-ray exams.  Follow-Up: Your physician wants you to follow-up as needed with Dr. Eden EmmsNishan.  If you need a refill on your cardiac medications before your next appointment, please call your pharmacy.

## 2017-06-16 ENCOUNTER — Ambulatory Visit: Payer: Medicare Other | Admitting: Internal Medicine

## 2017-06-16 ENCOUNTER — Encounter: Payer: Self-pay | Admitting: Internal Medicine

## 2017-06-16 VITALS — BP 140/80 | HR 90 | Temp 98.1°F | Ht 62.5 in | Wt 196.0 lb

## 2017-06-16 DIAGNOSIS — H9209 Otalgia, unspecified ear: Secondary | ICD-10-CM

## 2017-06-16 DIAGNOSIS — J029 Acute pharyngitis, unspecified: Secondary | ICD-10-CM | POA: Diagnosis not present

## 2017-06-16 DIAGNOSIS — H65113 Acute and subacute allergic otitis media (mucoid) (sanguinous) (serous), bilateral: Secondary | ICD-10-CM

## 2017-06-16 LAB — POCT RAPID STREP A (OFFICE): Rapid Strep A Screen: NEGATIVE

## 2017-06-16 MED ORDER — HYDROCOD POLST-CPM POLST ER 10-8 MG/5ML PO SUER
5.0000 mL | Freq: Two times a day (BID) | ORAL | 0 refills | Status: DC | PRN
Start: 2017-06-16 — End: 2017-09-19

## 2017-06-16 MED ORDER — DOXYCYCLINE HYCLATE 100 MG PO TABS: 100.0000 mg | ORAL_TABLET | Freq: Two times a day (BID) | ORAL | 0 refills | Status: DC

## 2017-06-16 MED ORDER — CEFTRIAXONE SODIUM 1 G IJ SOLR
1.0000 g | Freq: Once | INTRAMUSCULAR | Status: AC
  Administered 2017-06-16: 1 g via INTRAMUSCULAR

## 2017-06-16 MED ORDER — METHYLPREDNISOLONE ACETATE 80 MG/ML IJ SUSP: 80.0000 mg | Freq: Once | INTRAMUSCULAR | Status: DC

## 2017-06-22 ENCOUNTER — Ambulatory Visit
Admission: RE | Admit: 2017-06-22 | Discharge: 2017-06-22 | Disposition: A | Discharge status: Home / Self Care | Payer: Self-pay | Source: Ambulatory Visit | Attending: Cardiovascular Disease | Admitting: Cardiovascular Disease

## 2017-06-22 DIAGNOSIS — R079 Chest pain, unspecified: Secondary | ICD-10-CM

## 2017-06-22 DIAGNOSIS — E785 Hyperlipidemia, unspecified: Secondary | ICD-10-CM

## 2017-06-22 DIAGNOSIS — I10 Essential (primary) hypertension: Secondary | ICD-10-CM

## 2017-07-08 NOTE — Patient Instructions (Signed)
Rocephin 1 g IM.  Take doxycycline 100 mg twice daily for 10 days.  Rapid strep screen negative.

## 2017-07-08 NOTE — Progress Notes (Signed)
   Subjective:    Patient ID: Carla BarthelBarbara I Oliger, female    DOB: Jul 10, 1950, 67 y.o.   MRN: [id number]  HPI Patient in today with complaint of sore throat and ear pain.  History of non-insulin-dependent diabetes mellitus.  History of hypertension.  Has malaise and fatigue.  Rapid strep screen is negative.  Had respiratory infection in January.  At that time had ear pain.  Was treated with doxycycline, Tussionex Rocephin and prednisone.  She saw Dr. Nile RiggsShapiro for blepharoconjunctivitis treated with TobraDex February 18.       Review of Systems see above.  Has no fever or shaking chills.     Objective:   Physical Exam Skin warm and dry.  Nodes none.  TMs are slightly full bilaterally but not red.  Pharynx slightly injected without exudate and rapid strep screen is negative.  Neck is supple.  Chest clear.       Assessment & Plan:  Acute bilateral serous otitis media  Acute URI  Non-strep pharyngitis  Plan: Rocephin 1 g IM given in office today.  Doxycycline 100 mg twice daily for 10 days.  Rest and drink plenty of fluids.

## 2017-09-13 ENCOUNTER — Other Ambulatory Visit: Payer: Self-pay | Admitting: Internal Medicine

## 2017-09-13 DIAGNOSIS — Z5181 Encounter for therapeutic drug level monitoring: Secondary | ICD-10-CM

## 2017-09-13 DIAGNOSIS — I10 Essential (primary) hypertension: Secondary | ICD-10-CM

## 2017-09-13 DIAGNOSIS — E785 Hyperlipidemia, unspecified: Secondary | ICD-10-CM

## 2017-09-13 DIAGNOSIS — E119 Type 2 diabetes mellitus without complications: Secondary | ICD-10-CM

## 2017-09-13 DIAGNOSIS — Z79899 Other long term (current) drug therapy: Secondary | ICD-10-CM

## 2017-09-19 ENCOUNTER — Ambulatory Visit: Payer: Medicare Other | Admitting: Internal Medicine

## 2017-09-19 ENCOUNTER — Encounter: Payer: Self-pay | Admitting: Internal Medicine

## 2017-09-19 VITALS — BP 140/80 | HR 76 | Temp 98.1°F | Ht 62.5 in | Wt 198.0 lb

## 2017-09-19 DIAGNOSIS — J029 Acute pharyngitis, unspecified: Secondary | ICD-10-CM | POA: Diagnosis not present

## 2017-09-19 DIAGNOSIS — H6501 Acute serous otitis media, right ear: Secondary | ICD-10-CM

## 2017-09-19 DIAGNOSIS — H9201 Otalgia, right ear: Secondary | ICD-10-CM

## 2017-09-19 MED ORDER — DOXYCYCLINE HYCLATE 100 MG PO TABS: 100.0000 mg | ORAL_TABLET | Freq: Two times a day (BID) | ORAL | 0 refills | Status: DC

## 2017-09-19 NOTE — Progress Notes (Signed)
   Subjective:    Patient ID: Carla Oconnor, female    DOB: [birth year], 67 y.o.   MRN: [id number]  HPI 67 year old Female with onset sore throat and right ear pain.  No fever or shaking chills.  Malaise and fatigue present.      Review of Systems     Objective:   Physical Exam Skin warm and dry.  Nodes none.  Pharynx slightly injected.  No exudate.  Neck is supple.  Chest clear to auscultation without rales or wheezing.  Strep screen not done.  Right TM slightly full but not red.       Assessment & Plan:  Acute right serous otitis media  Right otalgia  Pharyngitis  Plan: Doxycycline 100 mg twice daily for 10 days.  Rest and drink plenty of fluids.

## 2017-09-26 ENCOUNTER — Other Ambulatory Visit: Payer: Medicare Other | Admitting: Internal Medicine

## 2017-09-26 DIAGNOSIS — I10 Essential (primary) hypertension: Secondary | ICD-10-CM

## 2017-09-26 DIAGNOSIS — E785 Hyperlipidemia, unspecified: Secondary | ICD-10-CM

## 2017-09-26 DIAGNOSIS — Z5181 Encounter for therapeutic drug level monitoring: Secondary | ICD-10-CM

## 2017-09-26 DIAGNOSIS — Z79899 Other long term (current) drug therapy: Secondary | ICD-10-CM

## 2017-09-26 DIAGNOSIS — E119 Type 2 diabetes mellitus without complications: Secondary | ICD-10-CM

## 2017-09-27 LAB — LIPID PANEL
Cholesterol: 283 mg/dL — ABNORMAL HIGH (ref <200)
HDL: 45 mg/dL — ABNORMAL LOW (ref >50)
LDL Cholesterol (Calc): 192 mg/dL (calc) — ABNORMAL HIGH
Non-HDL Cholesterol (Calc): 238 mg/dL (calc) — ABNORMAL HIGH (ref <130)
Total CHOL/HDL Ratio: 6.3 (calc) — ABNORMAL HIGH (ref <5.0)
Triglycerides: 246 mg/dL — ABNORMAL HIGH (ref <150)

## 2017-09-27 LAB — HEMOGLOBIN A1C
Hgb A1c MFr Bld: 6.9 % of total Hgb — ABNORMAL HIGH (ref <5.7)
Mean Plasma Glucose: 151 (calc)
eAG (mmol/L): 8.4 (calc)

## 2017-09-27 LAB — BASIC METABOLIC PANEL
BUN: 23 mg/dL (ref 7–25)
CO2: 32 mmol/L (ref 20–32)
Calcium: 9.7 mg/dL (ref 8.6–10.4)
Chloride: 101 mmol/L (ref 98–110)
Creat: 0.93 mg/dL (ref 0.50–0.99)
Glucose, Bld: 133 mg/dL — ABNORMAL HIGH (ref 65–99)
Potassium: 4.3 mmol/L (ref 3.5–5.3)
Sodium: 140 mmol/L (ref 135–146)

## 2017-09-27 LAB — MICROALBUMIN / CREATININE URINE RATIO
Creatinine, Urine: 218 mg/dL (ref 20–275)
Microalb Creat Ratio: 6 mcg/mg creat (ref <30)
Microalb, Ur: 1.4 mg/dL

## 2017-09-27 LAB — HEPATIC FUNCTION PANEL
AG Ratio: 1.8 (calc) (ref 1.0–2.5)
ALT: 13 U/L (ref 6–29)
AST: 14 U/L (ref 10–35)
Albumin: 4.3 g/dL (ref 3.6–5.1)
Alkaline phosphatase (APISO): 69 U/L (ref 33–130)
Bilirubin, Direct: 0.1 mg/dL (ref 0.0–0.2)
Globulin: 2.4 g/dL (calc) (ref 1.9–3.7)
Indirect Bilirubin: 0.4 mg/dL (calc) (ref 0.2–1.2)
Total Bilirubin: 0.5 mg/dL (ref 0.2–1.2)
Total Protein: 6.7 g/dL (ref 6.1–8.1)

## 2017-09-29 ENCOUNTER — Ambulatory Visit: Payer: Medicare Other | Admitting: Internal Medicine

## 2017-10-04 ENCOUNTER — Other Ambulatory Visit: Payer: Self-pay | Admitting: Internal Medicine

## 2017-10-13 ENCOUNTER — Other Ambulatory Visit: Payer: Self-pay | Admitting: Internal Medicine

## 2017-10-13 NOTE — Telephone Encounter (Signed)
Refill each x 6 months 

## 2017-10-15 NOTE — Patient Instructions (Signed)
Doxycycline 100 mg twice daily for 10 days.  Rest and drink plenty of fluids. 

## 2017-10-26 ENCOUNTER — Encounter: Payer: Self-pay | Admitting: Internal Medicine

## 2017-10-26 LAB — HM DIABETES EYE EXAM

## 2017-10-27 ENCOUNTER — Ambulatory Visit
Admission: RE | Admit: 2017-10-27 | Discharge: 2017-10-27 | Disposition: A | Discharge status: Home / Self Care | Payer: Medicare Other | Source: Ambulatory Visit | Attending: Internal Medicine | Admitting: Internal Medicine

## 2017-10-27 ENCOUNTER — Other Ambulatory Visit: Payer: Self-pay | Admitting: Internal Medicine

## 2017-10-27 ENCOUNTER — Ambulatory Visit: Payer: Medicare Other | Admitting: Internal Medicine

## 2017-10-27 ENCOUNTER — Encounter: Payer: Self-pay | Admitting: Internal Medicine

## 2017-10-27 DIAGNOSIS — M79622 Pain in left upper arm: Secondary | ICD-10-CM | POA: Diagnosis not present

## 2017-10-27 DIAGNOSIS — F4321 Adjustment disorder with depressed mood: Secondary | ICD-10-CM

## 2017-10-27 DIAGNOSIS — G44209 Tension-type headache, unspecified, not intractable: Secondary | ICD-10-CM | POA: Diagnosis not present

## 2017-10-27 DIAGNOSIS — M542 Cervicalgia: Secondary | ICD-10-CM | POA: Diagnosis not present

## 2017-10-27 MED ORDER — MELOXICAM 15 MG PO TABS
15.0000 mg | ORAL_TABLET | Freq: Every day | ORAL | 0 refills | Status: DC
Start: 2017-10-27 — End: 2018-09-13

## 2017-10-27 MED ORDER — OMEPRAZOLE 20 MG PO CPDR: 20.0000 mg | DELAYED_RELEASE_CAPSULE | Freq: Every day | ORAL | 3 refills | Status: DC

## 2017-10-27 MED ORDER — CYCLOBENZAPRINE HCL 10 MG PO TABS: 10.0000 mg | ORAL_TABLET | Freq: Every day | ORAL | 0 refills | Status: DC

## 2017-10-27 MED ORDER — HYDROCODONE-ACETAMINOPHEN 5-325 MG PO TABS: 1.0000 | ORAL_TABLET | Freq: Four times a day (QID) | ORAL | 0 refills | Status: DC | PRN

## 2017-10-27 NOTE — Patient Instructions (Signed)
Meloxicam 15 mg daily.  To have C-spine films today.  Take Flexeril 1/2 tablet at bedtime.  Apply ice 20 mg twice daily to sore neck and upper arm.  Omeprazole prescription sent in at your request.  Agree with counseling regarding grief reaction.  We are sorry to hear about the loss of her nephew.
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
## 2017-12-15 ENCOUNTER — Other Ambulatory Visit: Payer: Self-pay | Admitting: Internal Medicine

## 2018-02-01 ENCOUNTER — Other Ambulatory Visit: Payer: Self-pay | Admitting: Internal Medicine

## 2018-02-01 DIAGNOSIS — Z1231 Encounter for screening mammogram for malignant neoplasm of breast: Secondary | ICD-10-CM

## 2018-02-23 ENCOUNTER — Telehealth: Payer: Self-pay

## 2018-02-23 NOTE — Telephone Encounter (Signed)
Ask her to continue to monitor her BP and take meclizine Over the counter for dizziness. See next week if persistently elevated and Not intermittently elevated.

## 2018-02-23 NOTE — Telephone Encounter (Signed)
Spoke with patient and advised of Dr. Beryle Quant orders.  Patient will monitor her pressure and keep a diary.  She'll take the OTC meclizine and call us by Wednesday of next week if the BP is not down.  Patient verbalized understanding of our conversation.

## 2018-02-23 NOTE — Telephone Encounter (Addendum)
Patient called states she has been feeling dizzy for over a week now and her BP is a bit elevated on 11/06 151/84 and on Tuesday 140/88. She wants to know if you can see her this morning.

## 2018-03-09 ENCOUNTER — Ambulatory Visit: Payer: Medicare Other

## 2018-03-09 ENCOUNTER — Ambulatory Visit
Admission: RE | Admit: 2018-03-09 | Discharge: 2018-03-09 | Disposition: A | Discharge status: Home / Self Care | Payer: Medicare Other | Source: Ambulatory Visit | Attending: Internal Medicine | Admitting: Internal Medicine

## 2018-03-09 DIAGNOSIS — Z1231 Encounter for screening mammogram for malignant neoplasm of breast: Secondary | ICD-10-CM

## 2018-04-04 ENCOUNTER — Other Ambulatory Visit: Payer: Self-pay | Admitting: Internal Medicine

## 2018-04-05 ENCOUNTER — Other Ambulatory Visit: Payer: Medicare Other | Admitting: Internal Medicine

## 2018-04-05 DIAGNOSIS — E119 Type 2 diabetes mellitus without complications: Secondary | ICD-10-CM

## 2018-04-05 DIAGNOSIS — Z0000 Encounter for general adult medical examination without abnormal findings: Secondary | ICD-10-CM

## 2018-04-05 DIAGNOSIS — F411 Generalized anxiety disorder: Secondary | ICD-10-CM

## 2018-04-05 DIAGNOSIS — I10 Essential (primary) hypertension: Secondary | ICD-10-CM

## 2018-04-05 DIAGNOSIS — R1310 Dysphagia, unspecified: Secondary | ICD-10-CM

## 2018-04-05 DIAGNOSIS — J45909 Unspecified asthma, uncomplicated: Secondary | ICD-10-CM

## 2018-04-05 DIAGNOSIS — E8881 Metabolic syndrome: Secondary | ICD-10-CM

## 2018-04-05 DIAGNOSIS — K219 Gastro-esophageal reflux disease without esophagitis: Secondary | ICD-10-CM

## 2018-04-05 DIAGNOSIS — E785 Hyperlipidemia, unspecified: Secondary | ICD-10-CM

## 2018-04-05 DIAGNOSIS — M549 Dorsalgia, unspecified: Secondary | ICD-10-CM

## 2018-04-05 DIAGNOSIS — G8929 Other chronic pain: Secondary | ICD-10-CM

## 2018-04-06 ENCOUNTER — Encounter: Payer: Self-pay | Admitting: Internal Medicine

## 2018-04-06 ENCOUNTER — Ambulatory Visit (INDEPENDENT_AMBULATORY_CARE_PROVIDER_SITE_OTHER): Payer: Medicare Other | Admitting: Internal Medicine

## 2018-04-06 VITALS — BP 110/80 | HR 66 | Temp 98.1°F | Ht 62.5 in | Wt 196.0 lb

## 2018-04-06 DIAGNOSIS — M7918 Myalgia, other site: Secondary | ICD-10-CM

## 2018-04-06 DIAGNOSIS — E7800 Pure hypercholesterolemia, unspecified: Secondary | ICD-10-CM | POA: Diagnosis not present

## 2018-04-06 DIAGNOSIS — F411 Generalized anxiety disorder: Secondary | ICD-10-CM

## 2018-04-06 DIAGNOSIS — I10 Essential (primary) hypertension: Secondary | ICD-10-CM

## 2018-04-06 DIAGNOSIS — R8290 Unspecified abnormal findings in urine: Secondary | ICD-10-CM | POA: Diagnosis not present

## 2018-04-06 DIAGNOSIS — E119 Type 2 diabetes mellitus without complications: Secondary | ICD-10-CM | POA: Diagnosis not present

## 2018-04-06 DIAGNOSIS — Z8709 Personal history of other diseases of the respiratory system: Secondary | ICD-10-CM | POA: Diagnosis not present

## 2018-04-06 DIAGNOSIS — Z0000 Encounter for general adult medical examination without abnormal findings: Secondary | ICD-10-CM | POA: Diagnosis not present

## 2018-04-06 LAB — POCT URINALYSIS DIPSTICK
Bilirubin, UA: NEGATIVE
Glucose, UA: NEGATIVE
Ketones, UA: NEGATIVE
Nitrite, UA: NEGATIVE
Protein, UA: POSITIVE — AB
Spec Grav, UA: 1.01 (ref 1.010–1.025)
Urobilinogen, UA: 0.2 E.U./dL
pH, UA: 7 (ref 5.0–8.0)

## 2018-04-06 LAB — COMPLETE METABOLIC PANEL WITH GFR
AG Ratio: 1.7 (calc) (ref 1.0–2.5)
ALT: 16 U/L (ref 6–29)
AST: 18 U/L (ref 10–35)
Albumin: 4.3 g/dL (ref 3.6–5.1)
Alkaline phosphatase (APISO): 66 U/L (ref 33–130)
BUN/Creatinine Ratio: 19 (calc) (ref 6–22)
BUN: 20 mg/dL (ref 7–25)
CO2: 31 mmol/L (ref 20–32)
Calcium: 9.5 mg/dL (ref 8.6–10.4)
Chloride: 101 mmol/L (ref 98–110)
Creat: 1.04 mg/dL — ABNORMAL HIGH (ref 0.50–0.99)
GFR, Est African American: 64 mL/min/{1.73_m2} (ref >=60)
GFR, Est Non African American: 55 mL/min/{1.73_m2} — ABNORMAL LOW (ref >=60)
Globulin: 2.6 g/dL (calc) (ref 1.9–3.7)
Glucose, Bld: 125 mg/dL — ABNORMAL HIGH (ref 65–99)
Potassium: 3.8 mmol/L (ref 3.5–5.3)
Sodium: 143 mmol/L (ref 135–146)
Total Bilirubin: 0.6 mg/dL (ref 0.2–1.2)
Total Protein: 6.9 g/dL (ref 6.1–8.1)

## 2018-04-06 LAB — HEMOGLOBIN A1C
Hgb A1c MFr Bld: 7.3 % of total Hgb — ABNORMAL HIGH (ref <5.7)
Mean Plasma Glucose: 163 (calc)
eAG (mmol/L): 9 (calc)

## 2018-04-06 LAB — CBC WITH DIFFERENTIAL/PLATELET
Absolute Monocytes: 460 cells/uL (ref 200–950)
Basophils Absolute: 30 cells/uL (ref 0–200)
Basophils Relative: 0.7 %
Eosinophils Absolute: 82 cells/uL (ref 15–500)
Eosinophils Relative: 1.9 %
HCT: 34.9 % — ABNORMAL LOW (ref 35.0–45.0)
Hemoglobin: 11.9 g/dL (ref 11.7–15.5)
Lymphs Abs: 1716 cells/uL (ref 850–3900)
MCH: 30.4 pg (ref 27.0–33.0)
MCHC: 34.1 g/dL (ref 32.0–36.0)
MCV: 89.3 fL (ref 80.0–100.0)
MPV: 9.7 fL (ref 7.5–12.5)
Monocytes Relative: 10.7 %
Neutro Abs: 2012 cells/uL (ref 1500–7800)
Neutrophils Relative %: 46.8 %
Platelets: 285 10*3/uL (ref 140–400)
RBC: 3.91 10*6/uL (ref 3.80–5.10)
RDW: 13 % (ref 11.0–15.0)
Total Lymphocyte: 39.9 %
WBC: 4.3 10*3/uL (ref 3.8–10.8)

## 2018-04-06 LAB — LIPID PANEL
Cholesterol: 203 mg/dL — ABNORMAL HIGH (ref <200)
HDL: 52 mg/dL (ref >50)
LDL Cholesterol (Calc): 126 mg/dL (calc) — ABNORMAL HIGH
Non-HDL Cholesterol (Calc): 151 mg/dL (calc) — ABNORMAL HIGH (ref <130)
Total CHOL/HDL Ratio: 3.9 (calc) (ref <5.0)
Triglycerides: 134 mg/dL (ref <150)

## 2018-04-06 LAB — TSH: TSH: 1.81 mIU/L (ref 0.40–4.50)

## 2018-04-06 MED ORDER — IBUPROFEN 800 MG PO TABS: 800.0000 mg | ORAL_TABLET | Freq: Three times a day (TID) | ORAL | 1 refills | Status: DC | PRN

## 2018-04-06 MED ORDER — ALBUTEROL SULFATE HFA 108 (90 BASE) MCG/ACT IN AERS: 2.0000 | INHALATION_SPRAY | Freq: Four times a day (QID) | RESPIRATORY_TRACT | 11 refills | Status: DC | PRN
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
## 2018-04-07 LAB — URINE CULTURE
MICRO NUMBER:: 91526331
SPECIMEN QUALITY:: ADEQUATE

## 2018-05-06 NOTE — Patient Instructions (Signed)
Diabetes needs to be under better control.  Please work on diet exercise and weight loss and follow-up in 6 months.  Continue same medications.

## 2018-05-10 ENCOUNTER — Other Ambulatory Visit: Payer: Self-pay | Admitting: Internal Medicine

## 2018-05-25 ENCOUNTER — Other Ambulatory Visit: Payer: Self-pay | Admitting: Internal Medicine

## 2018-07-04 ENCOUNTER — Ambulatory Visit (INDEPENDENT_AMBULATORY_CARE_PROVIDER_SITE_OTHER): Payer: Medicare Other | Admitting: Internal Medicine

## 2018-07-04 ENCOUNTER — Encounter: Payer: Self-pay | Admitting: Internal Medicine

## 2018-07-04 ENCOUNTER — Other Ambulatory Visit: Payer: Self-pay

## 2018-07-04 VITALS — BP 120/70 | HR 80 | Temp 98.5°F | Ht 62.5 in | Wt 196.0 lb

## 2018-07-04 DIAGNOSIS — J029 Acute pharyngitis, unspecified: Secondary | ICD-10-CM

## 2018-07-04 DIAGNOSIS — J22 Unspecified acute lower respiratory infection: Secondary | ICD-10-CM | POA: Diagnosis not present

## 2018-07-04 LAB — POCT RAPID STREP A (OFFICE): Rapid Strep A Screen: NEGATIVE

## 2018-07-04 MED ORDER — HYDROCOD POLST-CPM POLST ER 10-8 MG/5ML PO SUER: 5.0000 mL | Freq: Two times a day (BID) | ORAL | 0 refills | Status: DC | PRN

## 2018-07-04 MED ORDER — DOXYCYCLINE HYCLATE 100 MG PO TABS: 100.0000 mg | ORAL_TABLET | Freq: Two times a day (BID) | ORAL | 0 refills | Status: DC

## 2018-07-04 NOTE — Patient Instructions (Signed)
Doxycycline 100 mg twice daily for 10 days.  Tussionex 1 teaspoon p.o. every 12 hours as needed cough and sore throat pain.  Rest and drink plenty of fluids.

## 2018-07-04 NOTE — Progress Notes (Signed)
   Subjective:    Patient ID: Carla Oconnor, female    DOB: Apr 19, 1950, 68 y.o.   MRN: [id number]  HPI 68 year old Female with well-controlled diabetes mellitus and hypertension as well as history of asthma in today with respiratory infection symptoms.  Is come down with sore throat and cough with discolored sputum production.  No fever or shaking chills.  No travel history.  No shortness of breath.  She is complaining of right ear pain.    Review of Systems see above     Objective:   Physical Exam Blood pressure 120/70.  Temperature 98.5 degrees weight 196 pounds.  Pulse oximetry 98%.  Pulse 80 and regular.  Skin warm and dry.  Nodes none.  Pharynx slightly injected.  Rapid strep screen negative.  No exudate is present.  Both TMs are dull but not red.  Neck is supple.  Chest clear to auscultation without rales or wheezing.  Respiratory virus panel obtained       Assessment & Plan:  Acute pharyngitis-non-strep  Lower respiratory infection-no hypoxia or wheezing  Plan: Doxycycline 100 mg twice daily for 10 days.  Tussionex 1 teaspoon p.o. every 12 hours as needed cough and sore throat pain.  Rest and drink plenty of fluids.

## 2018-07-04 NOTE — Addendum Note (Signed)
Addended by: Gregery Na on: 07/04/2018 12:36 PM   Modules accepted: Orders

## 2018-07-05 LAB — RESPIRATORY VIRUS PANEL

## 2018-07-27 ENCOUNTER — Ambulatory Visit
Admission: RE | Admit: 2018-07-27 | Discharge: 2018-07-27 | Disposition: A | Discharge status: Home / Self Care | Payer: Medicare Other | Source: Ambulatory Visit | Attending: Physical Medicine and Rehabilitation | Admitting: Physical Medicine and Rehabilitation

## 2018-07-27 ENCOUNTER — Other Ambulatory Visit: Payer: Self-pay | Admitting: Physical Medicine and Rehabilitation

## 2018-07-27 ENCOUNTER — Other Ambulatory Visit: Payer: Self-pay

## 2018-07-27 DIAGNOSIS — M545 Low back pain, unspecified: Secondary | ICD-10-CM

## 2018-07-27 DIAGNOSIS — M25561 Pain in right knee: Secondary | ICD-10-CM

## 2018-08-23 ENCOUNTER — Telehealth: Payer: Self-pay | Admitting: Cardiology

## 2018-08-23 NOTE — Telephone Encounter (Signed)
New message     Virtual video visit scheduled for 08-30-18.  Pt will have current bp reading available for nurse.  Pt gave consent for virtual visit.  Patient also wanted the nurse to know that she has been "having problems" off and on but will talk about them during the visit.  YOUR CARDIOLOGY TEAM HAS ARRANGED FOR AN E-VISIT FOR YOUR APPOINTMENT - PLEASE REVIEW IMPORTANT INFORMATION BELOW SEVERAL DAYS PRIOR TO YOUR APPOINTMENT  Due to the recent COVID-19 pandemic, we are transitioning in-person office visits to tele-medicine visits in an effort to decrease unnecessary exposure to our patients, their families, and staff. These visits are billed to your insurance just like a normal visit is. We also encourage you to sign up for MyChart if you have not already done so. You will need a smartphone if possible. For patients that do not have this, we can still complete the visit using a regular telephone but do prefer a smartphone to enable video when possible. You may have a family member that lives with you that can help. If possible, we also ask that you have a blood pressure cuff and scale at home to measure your blood pressure, heart rate and weight prior to your scheduled appointment. Patients with clinical needs that need an in-person evaluation and testing will still be able to come to the office if absolutely necessary. If you have any questions, feel free to call our office.     YOUR PROVIDER WILL BE USING THE FOLLOWING PLATFORM TO COMPLETE YOUR VISIT: Doximity   IF USING MYCHART - How to Download the MyChart App to Your SmartPhone   - If Apple, go to [employer] and type in MyChart in the search bar and download the app. If Android, ask patient to go to [employer] and type in [place] in the search bar and download the app. The app is free but as with any other app downloads, your phone may require you to verify saved payment information or Apple/Android password.  - You will need to  then log into the app with your MyChart username and password, and select [facility] as your healthcare provider to link the account.  - When it is time for your visit, go to the MyChart app, find appointments, and click Begin Video Visit. Be sure to Select Allow for your device to access the Microphone and Camera for your visit. You will then be connected, and your provider will be with you shortly.  **If you have any issues connecting or need assistance, please contact MyChart service desk [phone])**  **If using a computer, in order to ensure the best quality for your visit, you will need to use either of the following Internet Browsers: Agricultural consultant or Microsoft Edge**   IF USING DOXIMITY or [email or web address] - The staff will give you instructions on receiving your link to join the meeting the day of your visit.      2-3 DAYS BEFORE YOUR APPOINTMENT  You will receive a telephone call from one of our [facility] team members - your caller ID may say "Unknown caller." If this is a video visit, we will walk you through how to get the video launched on your phone. We will remind you check your blood pressure, heart rate and weight prior to your scheduled appointment. If you have an Apple Watch or Kardia, please upload any pertinent ECG strips the day before or morning of your appointment to MyChart. Our staff will also  make sure you have reviewed the consent and agree to move forward with your scheduled tele-health visit.     THE DAY OF YOUR APPOINTMENT  Approximately 15 minutes prior to your scheduled appointment, you will receive a telephone call from one of [facility] team - your caller ID may say "Unknown caller."  Our staff will confirm medications, vital signs for the day and any symptoms you may be experiencing. Please have this information available prior to the time of visit start. It may also be helpful for you to have a pad of paper and pen handy for any instructions given  during your visit. They will also walk you through joining the smartphone meeting if this is a video visit.    CONSENT FOR TELE-HEALTH VISIT - PLEASE REVIEW  I hereby voluntarily request, consent and authorize [facility] and its employed or contracted physicians, physician assistants, nurse practitioners or other licensed health care professionals (the Practitioner), to provide me with telemedicine health care services (the Services") as deemed necessary by the treating Practitioner. I acknowledge and consent to receive the Services by the Practitioner via telemedicine. I understand that the telemedicine visit will involve communicating with the Practitioner through live audiovisual communication technology and the disclosure of certain medical information by electronic transmission. I acknowledge that I have been given the opportunity to request an in-person assessment or other available alternative prior to the telemedicine visit and am voluntarily participating in the telemedicine visit.  I understand that I have the right to withhold or withdraw my consent to the use of telemedicine in the course of my care at any time, without affecting my right to future care or treatment, and that the Practitioner or I may terminate the telemedicine visit at any time. I understand that I have the right to inspect all information obtained and/or recorded in the course of the telemedicine visit and may receive copies of available information for a reasonable fee.  I understand that some of the potential risks of receiving the Services via telemedicine include:   Delay or interruption in medical evaluation due to technological equipment failure or disruption;  Information transmitted may not be sufficient (e.g. poor resolution of images) to allow for appropriate medical decision making by the Practitioner; and/or   In rare instances, security protocols could fail, causing a breach of personal health  information.  Furthermore, I acknowledge that it is my responsibility to provide information about my medical history, conditions and care that is complete and accurate to the best of my ability. I acknowledge that Practitioner's advice, recommendations, and/or decision may be based on factors not within their control, such as incomplete or inaccurate data provided by me or distortions of diagnostic images or specimens that may result from electronic transmissions. I understand that the practice of medicine is not an exact science and that Practitioner makes no warranties or guarantees regarding treatment outcomes. I acknowledge that I will receive a copy of this consent concurrently upon execution via email to the email address I last provided but may also request a printed copy by calling the office of [facility].    I understand that my insurance will be billed for this visit.   I have read or had this consent read to me.  I understand the contents of this consent, which adequately explains the benefits and risks of the Services being provided via telemedicine.   I have been provided ample opportunity to ask questions regarding this consent and the Services and have  had my questions answered to my satisfaction.  I give my informed consent for the services to be provided through the use of telemedicine in my medical care  By participating in this telemedicine visit I agree to the above.

## 2018-08-29 NOTE — Progress Notes (Signed)
Virtual Visit via Video Note   This visit type was conducted due to national recommendations for restrictions regarding the COVID-19 Pandemic (e.g. social distancing) in an effort to limit this patient's exposure and mitigate transmission in our community.  Due to her co-morbid illnesses, this patient is at least at moderate risk for complications without adequate follow up.  This format is felt to be most appropriate for this patient at this time.  All issues noted in this document were discussed and addressed.  A limited physical exam was performed with this format.  Please refer to the patient's chart for her consent to telehealth for [facility].   Date:  08/30/2018   ID:  Carla Oconnor, DOB Feb 11, 1951, MRN [id number]  Patient Location: Home Provider Location: Office  PCP:  [name], MD  Cardiologist:  Charlton Haws, MD  Electrophysiologist:  None   Evaluation Performed:  Follow-Up Visit  Chief Complaint:  HTN   History of Present Illness:    Carla Oconnor is a 68 y.o. female with CRF;s include HTN, HLD and DM-2.  She also has some Asthma Reviewed primary note from 03/30/17 patient  Complained of left anterior chest pain starting in arm. 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 Her BP controlled she is diabetic, and HLD.  Today she is doing well. Still with prickly Lt an chest pain, lasts only seconds.  Never wakes her from sleep. No SOB.  Low risk for angina with low ca+ score and prior neg cath.  But she does have risk factors for CAD with DM, HTN, HLD.  All treated.  Her BP is elevated today but she had not taken her meds at the  time.  Last visit with Dr. Lenord Fellers BP was 120/70 in March 2020.  Pt did have allergy symptoms at that time with sore throat but was treated and has been fine since.  When she is out she wears mask and gloves and washes her hands freq   She and her husband walk 3 X week for exercise.  She does try and eat healthy.   The patient does not have symptoms concerning for COVID-19 infection (fever, chills, cough, or new shortness of breath).    Past Medical History:  Diagnosis Date  . Asthma    as child and seasonal  . Diabetes mellitus without complication (HCC)   . Fibrocystic breast   . GERD (gastroesophageal reflux disease)   . Hyperlipidemia   . Hypertension   . Neuromuscular disorder (HCC)    tennis elbow on right and left arm  . S/P hysterectomy    Past Surgical History:  Procedure Laterality Date  . ABDOMINAL HYSTERECTOMY     2003- still has left ovary  . BREAST EXCISIONAL BIOPSY    . BREAST SURGERY     biopsy rt breast-1960- benign.Left breast 1975  - lumpectomy - benign  . CESAREAN SECTION    . TRIGGER FINGER RELEASE  02/16/2012   Procedure: RELEASE TRIGGER FINGER/A-1 PULLEY;  Surgeon: Wyn Forster., MD;  Location: [facility];  Service: Orthopedics;  Laterality: Right;  RIGHT RING FINGER      Current Meds  Medication Sig  . albuterol (PROVENTIL HFA;VENTOLIN HFA) 108 (90 Base) MCG/ACT inhaler Inhale 2 puffs into the lungs every 6 (six) hours as needed for wheezing or shortness of breath.  Carla Oconnor. aspirin 81 MG tablet Take 81 mg by mouth 2 (two) times daily.   Carla Oconnor. BAYER CONTOUR TEST test strip USE AS DIRECTED  . cholecalciferol (VITAMIN D) 1000 UNITS tablet Take 1,000 Units by mouth daily.   . clonazePAM (KLONOPIN) 0.5 MG tablet TAKE 1 TABLET BY MOUTH EVERY NIGHT AT BEDTIME AS NEEDED FOR SLEEP  . cyclobenzaprine (FLEXERIL) 10 MG tablet Take 1 tablet (10 mg total) by mouth at bedtime.  Carla Oconnor. FINGERSTIX LANCETS MISC Check blood sugar 4-5 times daily. She has a Secretary/administratorBayer  Contour meter  . glipiZIDE (GLUCOTROL XL) 10 MG 24 hr tablet TAKE 1 TABLET(10 MG) BY MOUTH DAILY WITH BREAKFAST  . ibuprofen (ADVIL,MOTRIN) 800 MG tablet Take 1 tablet (800 mg total) by mouth every 8 (eight) hours as needed.  . meloxicam (MOBIC) 15 MG tablet Take 1 tablet (15 mg total) by mouth daily.  . metFORMIN (GLUCOPHAGE) 1000 MG tablet TAKE 1 TABLET BY MOUTH TWICE DAILY WITH MEALS  . Multiple Vitamin (MULTIVITAMIN WITH MINERALS) TABS tablet Take 1 tablet by mouth daily.  Carla Oconnor. omeprazole (PRILOSEC) 20 MG capsule Take 1 capsule (20 mg total) by mouth daily.  . potassium chloride SA (K-DUR,KLOR-CON) 20 MEQ tablet TAKE 2 TABLETS BY MOUTH EVERY DAY.  . rosuvastatin (CRESTOR) 20 MG tablet TAKE 1 TABLET BY MOUTH EVERY DAY  . valACYclovir (VALTREX) 500 MG tablet Take 500 mg by mouth 2 (two) times daily as needed (breakouts).   . valsartan-hydrochlorothiazide (DIOVAN-HCT) 160-25 MG tablet TAKE 1 TABLET BY MOUTH EVERY DAY     Allergies:   Tramadol and Percocet [oxycodone-acetaminophen]   Social History   Tobacco Use  . Smoking status: Never Smoker  . Smokeless tobacco: Never Used  Substance Use Topics  . Alcohol use: No  . Drug use: No     Family Hx: The patient's family history includes Asthma in an other family member; Breast cancer in her sister; Cancer in an other family member; Diabetes in an other family member; Hyperlipidemia in an other family member; Hypertension in an other family member; Stroke in her father.  ROS:   Please see the history of present illness.    General:no colds or fevers, no weight changes Skin:no rashes or ulcers HEENT:no blurred vision, no congestion CV:see HPI  No palpitations  PUL:see HPI GI:no diarrhea constipation or melena, no indigestion GU:no hematuria, no dysuria MS:no joint pain, no claudication Neuro:no syncope, no lightheadedness Endo:no diabetes, no thyroid disease  All other systems reviewed and are negative.   Prior CV studies:   The  following studies were reviewed today:  Cardiac CT 06/22/17 Coronary arteries: Very mild calcium noted in proximal RCA/Circumflex and distal LAD  IMPRESSION: Coronary calcium score of 3. This was 5760 th percentile for age and sex matched control.   Labs/Other Tests and Data Reviewed:    EKG:  An ECG dated 03/30/17 was personally reviewed today and demonstrated:  SR and no acute changes  Recent Labs: 04/05/2018: ALT 16; BUN 20; Creat 1.04; Hemoglobin 11.9; Platelets 285; Potassium 3.8; Sodium 143; TSH 1.81   Recent Lipid Panel Lab Results  Component Value Date/Time   CHOL 203 (H) 04/05/2018 11:59 AM   TRIG 134 04/05/2018 11:59 AM   HDL 52 04/05/2018 11:59 AM  CHOLHDL 3.9 04/05/2018 11:59 AM   LDLCALC 126 (H) 04/05/2018 11:59 AM    Wt Readings from Last 3 Encounters:  08/30/18 191 lb (86.6 kg)  07/04/18 196 lb (88.9 kg)  04/06/18 196 lb (88.9 kg)     Objective:    Vital Signs:  BP (!) 149/76   Pulse 89   Ht  (1.575 m)   Wt 191 lb (86.6 kg)   BMI 34.93 kg/m    VITAL SIGNS:  reviewed  General female in NAD Neuro A&O X 3 follows commands  Lungs able to complete sentences without SOB.  Psych pleasant affect.   ASSESSMENT & PLAN:    1. HTN today elevated but had not yet taken meds.  Normally on recent OV with PCP she was 120/70 2. HLD followed by PCP 3. Chest pain with low risk on cardiac CTA and stable pain, fleeting.   COVID-19 Education: The signs and symptoms of COVID-19 were discussed with the patient and how to seek care for testing (follow up with PCP or arrange E-visit).  The importance of social distancing was discussed today.  Time:   Today, I have spent 15 minutes with the patient with telehealth technology discussing the above problems.     Medication Adjustments/Labs and Tests Ordered: Current medicines are reviewed at length with the patient today.  Concerns regarding medicines are outlined above.   Tests Ordered: No orders of the defined  types were placed in this encounter.   Medication Changes: No orders of the defined types were placed in this encounter.   Disposition:  Follow up in 1 year(s)  Signed, Nada Boozer, NP  08/30/2018 11:22 AM    [facility]

## 2018-08-30 ENCOUNTER — Telehealth (INDEPENDENT_AMBULATORY_CARE_PROVIDER_SITE_OTHER): Payer: Medicare Other | Admitting: Cardiology

## 2018-08-30 ENCOUNTER — Encounter: Payer: Self-pay | Admitting: Cardiology

## 2018-08-30 ENCOUNTER — Other Ambulatory Visit: Payer: Self-pay

## 2018-08-30 VITALS — BP 149/76 | HR 89 | Ht 62.0 in | Wt 191.0 lb

## 2018-08-30 DIAGNOSIS — R079 Chest pain, unspecified: Secondary | ICD-10-CM

## 2018-08-30 DIAGNOSIS — I10 Essential (primary) hypertension: Secondary | ICD-10-CM | POA: Diagnosis not present

## 2018-08-30 DIAGNOSIS — E785 Hyperlipidemia, unspecified: Secondary | ICD-10-CM

## 2018-08-30 NOTE — Patient Instructions (Signed)
Medication Instructions:  Your physician recommends that you continue on your current medications as directed. Please refer to the Current Medication list given to you today.  If you need a refill on your cardiac medications before your next appointment, please call your pharmacy.   Lab work: NONE If you have labs (blood work) drawn today and your tests are completely normal, you will receive your results only by: Marland Kitchen MyChart Message (if you have MyChart) OR . A paper copy in the mail If you have any lab test that is abnormal or we need to change your treatment, we will call you to review the results.  Testing/Procedures: NONE  Follow-Up: At [facility], you and your health needs are our priority.  As part of our continuing mission to provide you with exceptional heart care, we have created designated Provider Care Teams.  These Care Teams include your primary Cardiologist (physician) and Advanced Practice Providers (APPs -  Physician Assistants and Nurse Practitioners) who all work together to provide you with the care you need, when you need it. You will need a follow up appointment in 12 months.  Please call our office 2 months in advance to schedule this appointment.  You may see Charlton Haws, MD or one of the following Advanced Practice Providers on your designated Care Team:   Norma Fredrickson, NP Nada Boozer, NP . Georgie Chard, NP

## 2018-09-13 ENCOUNTER — Encounter: Payer: Self-pay | Admitting: Internal Medicine

## 2018-09-13 ENCOUNTER — Telehealth: Payer: Self-pay | Admitting: Internal Medicine

## 2018-09-13 MED ORDER — MELOXICAM 15 MG PO TABS: 15.0000 mg | ORAL_TABLET | Freq: Every day | ORAL | 0 refills | Status: DC

## 2018-09-13 NOTE — Telephone Encounter (Addendum)
Carla Oconnor [phone]  [name] called to say she has pain radiating from right thigh to foot and she notice some swelling in foot yesterday.  Suspect musculoskeletal pain. She previously has had Meloxicam prescribed. If she has that, can start taking it for pain. See next week if not improving. Meds reviewed. Takes 81 mg ASA also.

## 2018-09-13 NOTE — Addendum Note (Signed)
Addended by: Gregery Na on: 09/13/2018 12:28 PM   Modules accepted: Orders

## 2018-09-13 NOTE — Telephone Encounter (Signed)
Refill Meloxicam x 6 months

## 2018-09-13 NOTE — Telephone Encounter (Signed)
Spoke with Britta Mccreedy, she verbally acknowledged understanding. She stated that she has a few Meloxicam but would need refill and she will also start taking 81 mg ASA. Pharmacy is [facility] [address] She will call back if pain and swelling continue to be seen next week.

## 2018-10-08 ENCOUNTER — Ambulatory Visit (INDEPENDENT_AMBULATORY_CARE_PROVIDER_SITE_OTHER): Payer: Medicare Other | Admitting: Orthopaedic Surgery

## 2018-10-08 ENCOUNTER — Other Ambulatory Visit: Payer: Self-pay

## 2018-10-08 ENCOUNTER — Encounter: Payer: Self-pay | Admitting: Orthopaedic Surgery

## 2018-10-08 DIAGNOSIS — S83241A Other tear of medial meniscus, current injury, right knee, initial encounter: Secondary | ICD-10-CM | POA: Diagnosis not present

## 2018-10-08 NOTE — Progress Notes (Signed)
Office Visit Note   Patient: Carla BarthelBarbara I Umstead           Date of Birth: Nov 25, 1950           MRN: [id number] Visit Date: 10/08/2018              Requested by: Margaree MackintoshBaxley, Mary J, MD [address] [place] [id number] PCP: Margaree MackintoshBaxley, Mary J, MD   Assessment & Plan: Visit Diagnoses:  1. Acute medial meniscal tear, right, initial encounter     Plan: The patient does have an acute symptomatic right knee medial meniscal tear.  At this point with a very conservative treatment we are recommending a knee arthroscopy with partial medial meniscectomy.  I showed her a knee model and went over surgery in detail.  We had a long and thorough discussion of her interoperative and postoperative course.  We also discussed the risk and benefits of surgery in detail.  This could be done as an outpatient.  We will work on getting this scheduled.  All question concerns were answered and addressed.  We would then see her back in 1 week postop.  Follow-Up Instructions: Return for 1 week post-op.   Orders:  No orders of the defined types were placed in this encounter.  No orders of the defined types were placed in this encounter.     Procedures: No procedures performed   Clinical Data: No additional findings.   Subjective: Chief Complaint  Patient presents with  . Right Knee - Pain  The patient comes in today as a referral due to an acute tear of her right knee medial meniscus.  She is a very active 68 year old female who is never had any issues with her right knee before an acute injury 3 weeks ago.  She was cooking outside and twisted her knee and felt a pop in her knee.  She had swelling after that.  She has been walking with a cane with a significant limp.  She saw another specialist who did provide a steroid injection in her knee which only temporize the symptoms shortly.  An MRI was obtained of her right knee and it did show an acute medial meniscal tear which is a high-grade tear.   There was no significant cartilage deficit in the medial compartment of her knee and evidence on clinical exam and the MRI showed this was an acute injury.  She is sent here for surgical evaluation.  She is walking with a limp.  She does report locking catching and swelling with her right knee and the failure of conservative treatment.  She is a type II diabetic but under good control.  She does have high blood pressure as well and takes medications for this.  She is not on blood thinning medication other than aspirin.  HPI  Review of Systems She currently denies any headache, chest pain, shortness of breath, fever, chills, nausea, vomiting  Objective: Vital Signs: There were no vitals taken for this visit.  Physical Exam She is alert and orient x3 and in no acute distress Ortho Exam Examination of her left knee is normal examination of her right knee shows a slight effusion.  There is a positive Murray sign to the medial compartment of her knee.  Her range of motion is limited in terms of pain past 90 degrees of flexion.  She can flex her knee but is very painful.  She has medial joint line tenderness as well. Specialty Comments:  No specialty comments  available.  Imaging: No results found. The MRI does show a high-grade tear of the posterior root horn and body of the medial meniscus with only slight thinning of the cartilage in the medial compartment.  PMFS History: Patient Active Problem List   Diagnosis Date Noted  . Acute medial meniscal tear, right, initial encounter 10/08/2018  . Herpes simplex type II infection 05/06/2017  . Chronic back pain 10/25/2013  . Metabolic syndrome 56/43/3295  . Anxiety state, unspecified 05/28/2012  . Insomnia 05/28/2012  . Hyperlipidemia 01/17/2011  . Hypertension 01/17/2011  . GE reflux 01/17/2011  . Asthma 01/17/2011  . Fibrocystic breast disease 01/17/2011  . Diabetes mellitus ([place]) 01/17/2011   Past Medical History:  Diagnosis Date  .  Asthma    as child and seasonal  . Diabetes mellitus without complication ([place])   . Fibrocystic breast   . GERD (gastroesophageal reflux disease)   . Hyperlipidemia   . Hypertension   . Neuromuscular disorder ([place])    tennis elbow on right and left arm  . S/P hysterectomy     Family History  Problem Relation Age of Onset  . Stroke Father   . Breast cancer Sister   . Asthma Other   . Hypertension Other   . Cancer Other   . Diabetes Other   . Hyperlipidemia Other     Past Surgical History:  Procedure Laterality Date  . ABDOMINAL HYSTERECTOMY     2003- still has left ovary  . BREAST EXCISIONAL BIOPSY    . BREAST SURGERY     biopsy rt breast-1960- benign.Left breast 1975  - lumpectomy - benign  . CESAREAN SECTION    . TRIGGER FINGER RELEASE  02/16/2012   Procedure: RELEASE TRIGGER FINGER/A-1 PULLEY;  Surgeon: Cammie Sickle., MD;  Location: [facility];  Service: Orthopedics;  Laterality: Right;  RIGHT RING FINGER    Social History   Occupational History  . Not on file  Tobacco Use  . Smoking status: Never Smoker  . Smokeless tobacco: Never Used  Substance and Sexual Activity  . Alcohol use: No  . Drug use: No  . Sexual activity: Not Currently

## 2018-10-09 ENCOUNTER — Other Ambulatory Visit: Payer: Self-pay

## 2018-10-09 ENCOUNTER — Other Ambulatory Visit: Payer: Medicare Other | Admitting: Internal Medicine

## 2018-10-09 ENCOUNTER — Other Ambulatory Visit: Payer: Self-pay | Admitting: Internal Medicine

## 2018-10-09 DIAGNOSIS — E785 Hyperlipidemia, unspecified: Secondary | ICD-10-CM

## 2018-10-09 DIAGNOSIS — Z5181 Encounter for therapeutic drug level monitoring: Secondary | ICD-10-CM

## 2018-10-09 DIAGNOSIS — E7800 Pure hypercholesterolemia, unspecified: Secondary | ICD-10-CM

## 2018-10-09 DIAGNOSIS — E119 Type 2 diabetes mellitus without complications: Secondary | ICD-10-CM

## 2018-10-09 DIAGNOSIS — Z79899 Other long term (current) drug therapy: Secondary | ICD-10-CM

## 2018-10-10 LAB — LIPID PANEL
Cholesterol: 173 mg/dL (ref <200)
HDL: 60 mg/dL (ref >=50)
LDL Cholesterol (Calc): 89 mg/dL (calc)
Non-HDL Cholesterol (Calc): 113 mg/dL (calc) (ref <130)
Total CHOL/HDL Ratio: 2.9 (calc) (ref <5.0)
Triglycerides: 147 mg/dL (ref <150)

## 2018-10-10 LAB — HEMOGLOBIN A1C
Hgb A1c MFr Bld: 7.3 % of total Hgb — ABNORMAL HIGH (ref <5.7)
Mean Plasma Glucose: 163 (calc)
eAG (mmol/L): 9 (calc)

## 2018-10-10 LAB — HEPATIC FUNCTION PANEL
AG Ratio: 1.9 (calc) (ref 1.0–2.5)
ALT: 20 U/L (ref 6–29)
AST: 22 U/L (ref 10–35)
Albumin: 4.3 g/dL (ref 3.6–5.1)
Alkaline phosphatase (APISO): 58 U/L (ref 37–153)
Bilirubin, Direct: 0.1 mg/dL (ref 0.0–0.2)
Globulin: 2.3 g/dL (calc) (ref 1.9–3.7)
Indirect Bilirubin: 0.4 mg/dL (calc) (ref 0.2–1.2)
Total Bilirubin: 0.5 mg/dL (ref 0.2–1.2)
Total Protein: 6.6 g/dL (ref 6.1–8.1)

## 2018-10-10 LAB — MICROALBUMIN / CREATININE URINE RATIO
Creatinine, Urine: 161 mg/dL (ref 20–275)
Microalb Creat Ratio: 5 mcg/mg creat (ref <30)
Microalb, Ur: 0.8 mg/dL

## 2018-10-11 ENCOUNTER — Ambulatory Visit: Payer: Medicare Other | Admitting: Internal Medicine

## 2018-10-11 ENCOUNTER — Other Ambulatory Visit: Payer: Self-pay

## 2018-10-11 VITALS — BP 124/80 | HR 70 | Ht 62.0 in | Wt 195.0 lb

## 2018-10-11 DIAGNOSIS — Z87898 Personal history of other specified conditions: Secondary | ICD-10-CM | POA: Diagnosis not present

## 2018-10-11 DIAGNOSIS — I10 Essential (primary) hypertension: Secondary | ICD-10-CM

## 2018-10-11 DIAGNOSIS — E1169 Type 2 diabetes mellitus with other specified complication: Secondary | ICD-10-CM | POA: Diagnosis not present

## 2018-10-11 DIAGNOSIS — F411 Generalized anxiety disorder: Secondary | ICD-10-CM

## 2018-10-11 DIAGNOSIS — E785 Hyperlipidemia, unspecified: Secondary | ICD-10-CM

## 2018-10-15 NOTE — Progress Notes (Signed)
   Subjective:    Patient ID: Carla Oconnor, female    DOB: November 16, 1950, 68 y.o.   MRN: [id number]  HPI 68 year old Female in today for 32-month follow-up on anxiety, diabetes mellitus, GE reflux, hypertension and hyperlipidemia as well as metabolic syndrome and insomnia.  She lost her nephew in the summer 2019 due to a normal vehicle car accident during bad weather.  Family is still adjusting to this loss.  Hemoglobin A1c 7.3% stable from December 2019.  Lipid panel is completely normal and improved from December 2019.  Liver functions are normal.  No new complaints.  Generally doing fairly well.    Review of Systems no new complaints     Objective:   Physical Exam  Pulse 70 and regular.  Weight 195 pounds.  BMI 35.67 Blood pressure 124/80  Skin warm and dry.  Nodes none.  Neck is supple without JVD thyromegaly or carotid bruits.  Chest clear to auscultation.  Cardiac exam regular rate and rhythm normal S1 and S2.  Extremities without edema.     Assessment & Plan:  Type 2 diabetes mellitus.  Treat with metformin 1000 mg twice daily if tolerated and glipizide.  Hypertension treated with Diovan HCTZ 160/25 daily  Anxiety state treated with Klonopin at bedtime  Hyperlipidemia treated with Crestor  BMI 35.67- advise diet exercise and weight loss  Hypokalemia treated with potassium therapy.  Plan: See above and return in 6 months

## 2018-10-25 ENCOUNTER — Other Ambulatory Visit: Payer: Self-pay

## 2018-10-25 ENCOUNTER — Encounter: Payer: Self-pay | Admitting: Internal Medicine

## 2018-10-25 ENCOUNTER — Ambulatory Visit: Payer: Medicare Other | Admitting: Internal Medicine

## 2018-10-25 VITALS — BP 130/80 | HR 87 | Ht 62.0 in | Wt 194.0 lb

## 2018-10-25 DIAGNOSIS — M542 Cervicalgia: Secondary | ICD-10-CM | POA: Diagnosis not present

## 2018-10-25 NOTE — Progress Notes (Signed)
   Subjective:    Patient ID: Carla Oconnor, female    DOB: 09/04/1950, 68 y.o.   MRN: [id number]  HPI She was here in June for 53-month follow-up on anxiety, diabetes mellitus, GE reflux, hypertension and hyperlipidemia as well as metabolic syndrome and insomnia.  Is scheduled for right knee arthroscopic surgery and partial medial meniscectomy July 31 by Dr. Ninfa Linden.  Issues with musculoskeletal pain.  Would like to have Flexeril for sleep.  Review of Systems see above-sometimes has neck and shoulder pain in addition to knee pain     Objective:   Physical Exam Not examined spent 10 minutes speaking with her about this issue       Assessment & Plan:  Musculoskeletal pain-treat with Flexeril at bedtime

## 2018-11-01 ENCOUNTER — Telehealth: Payer: Self-pay | Admitting: Internal Medicine

## 2018-11-01 DIAGNOSIS — E119 Type 2 diabetes mellitus without complications: Secondary | ICD-10-CM

## 2018-11-01 NOTE — Telephone Encounter (Signed)
Carla Oconnor  [phone]  [name] called to say she needs strips and needles for her Bayer glucose meter.

## 2018-11-02 ENCOUNTER — Other Ambulatory Visit: Payer: Self-pay

## 2018-11-02 DIAGNOSIS — Z20822 Contact with and (suspected) exposure to covid-19: Secondary | ICD-10-CM

## 2018-11-02 MED ORDER — GLUCOSE BLOOD VI STRP: ORAL_STRIP | 12 refills | Status: AC

## 2018-11-02 MED ORDER — FINGERSTIX LANCETS MISC: 11 refills | Status: DC

## 2018-11-02 NOTE — Telephone Encounter (Signed)
ESCRIBED

## 2018-11-03 ENCOUNTER — Encounter: Payer: Self-pay | Admitting: Internal Medicine

## 2018-11-03 NOTE — Patient Instructions (Signed)
It was a pleasure to see you today.  Continue medications as prescribed and return in 6 months.  Work on diet exercise and weight loss.

## 2018-11-07 ENCOUNTER — Other Ambulatory Visit: Payer: Self-pay | Admitting: Physician Assistant

## 2018-11-07 LAB — NOVEL CORONAVIRUS, NAA: SARS-CoV-2, NAA: NOT DETECTED

## 2018-11-11 NOTE — Patient Instructions (Signed)
Take Flexeril at bedtime for musculoskeletal pain

## 2018-11-12 ENCOUNTER — Other Ambulatory Visit: Payer: Self-pay

## 2018-11-13 ENCOUNTER — Inpatient Hospital Stay (HOSPITAL_COMMUNITY): Admission: RE | Admit: 2018-11-13 | Payer: Medicare Other | Source: Ambulatory Visit

## 2018-11-13 NOTE — Progress Notes (Signed)
I spoke with Carla Oconnor to inquire of her arrival for covid 19 screening today. She stated due to an emergency she has called Dr Trevor Mace office to reschedule her procedure for a later date.

## 2018-11-14 ENCOUNTER — Encounter (HOSPITAL_COMMUNITY): Payer: Self-pay

## 2018-11-15 ENCOUNTER — Encounter (HOSPITAL_COMMUNITY): Admission: RE | Admit: 2018-11-15 | Payer: Medicare Other | Source: Ambulatory Visit

## 2018-11-26 ENCOUNTER — Other Ambulatory Visit: Payer: Self-pay

## 2018-11-26 DIAGNOSIS — Z20822 Contact with and (suspected) exposure to covid-19: Secondary | ICD-10-CM

## 2018-11-27 LAB — NOVEL CORONAVIRUS, NAA: SARS-CoV-2, NAA: NOT DETECTED

## 2018-11-28 ENCOUNTER — Other Ambulatory Visit: Payer: Self-pay

## 2018-11-28 ENCOUNTER — Encounter (HOSPITAL_BASED_OUTPATIENT_CLINIC_OR_DEPARTMENT_OTHER): Payer: Self-pay | Admitting: *Deleted

## 2018-11-29 ENCOUNTER — Other Ambulatory Visit: Payer: Self-pay | Admitting: Internal Medicine

## 2018-12-03 ENCOUNTER — Other Ambulatory Visit: Payer: Self-pay

## 2018-12-03 ENCOUNTER — Encounter (HOSPITAL_BASED_OUTPATIENT_CLINIC_OR_DEPARTMENT_OTHER)
Admission: RE | Admit: 2018-12-03 | Discharge: 2018-12-03 | Disposition: A | Discharge status: Home / Self Care | Payer: Medicare Other | Source: Ambulatory Visit | Attending: Orthopaedic Surgery | Admitting: Orthopaedic Surgery

## 2018-12-03 ENCOUNTER — Other Ambulatory Visit (HOSPITAL_COMMUNITY)
Admission: RE | Admit: 2018-12-03 | Discharge: 2018-12-03 | Disposition: A | Discharge status: Home / Self Care | Payer: Medicare Other | Source: Ambulatory Visit | Attending: Orthopaedic Surgery | Admitting: Orthopaedic Surgery

## 2018-12-03 DIAGNOSIS — Z20828 Contact with and (suspected) exposure to other viral communicable diseases: Secondary | ICD-10-CM | POA: Diagnosis not present

## 2018-12-03 DIAGNOSIS — S83241A Other tear of medial meniscus, current injury, right knee, initial encounter: Secondary | ICD-10-CM | POA: Insufficient documentation

## 2018-12-03 DIAGNOSIS — M659 Synovitis and tenosynovitis, unspecified: Secondary | ICD-10-CM | POA: Insufficient documentation

## 2018-12-03 DIAGNOSIS — Z01812 Encounter for preprocedural laboratory examination: Secondary | ICD-10-CM | POA: Diagnosis present

## 2018-12-03 DIAGNOSIS — Z01818 Encounter for other preprocedural examination: Secondary | ICD-10-CM | POA: Insufficient documentation

## 2018-12-03 LAB — BASIC METABOLIC PANEL
Anion gap: 9 (ref 5–15)
BUN: 18 mg/dL (ref 8–23)
CO2: 30 mmol/L (ref 22–32)
Calcium: 9.1 mg/dL (ref 8.9–10.3)
Chloride: 102 mmol/L (ref 98–111)
Creatinine, Ser: 0.92 mg/dL (ref 0.44–1.00)
GFR calc Af Amer: >60 mL/min (ref >60)
GFR calc non Af Amer: >60 mL/min (ref >60)
Glucose, Bld: 127 mg/dL — ABNORMAL HIGH (ref 70–99)
Potassium: 4.1 mmol/L (ref 3.5–5.1)
Sodium: 141 mmol/L (ref 135–145)

## 2018-12-03 LAB — SARS CORONAVIRUS 2 (TAT 6-24 HRS): SARS Coronavirus 2: NEGATIVE

## 2018-12-03 NOTE — Progress Notes (Signed)

## 2018-12-05 ENCOUNTER — Telehealth: Payer: Self-pay | Admitting: Orthopaedic Surgery

## 2018-12-05 NOTE — Anesthesia Preprocedure Evaluation (Addendum)
Anesthesia Evaluation  Patient identified by MRN, date of birth, ID band Patient awake    Reviewed: Allergy & Precautions, NPO status , Patient's Chart, lab work & pertinent test results  Airway Mallampati: III  TM Distance: >3 FB Neck ROM: Full    Dental no notable dental hx.    Pulmonary asthma ,    Pulmonary exam normal breath sounds clear to auscultation       Cardiovascular hypertension, Pt. on medications Normal cardiovascular exam Rhythm:Regular Rate:Normal     Neuro/Psych Anxiety negative neurological ROS     GI/Hepatic Neg liver ROS, GERD  Medicated and Controlled,  Endo/Other  diabetes, Oral Hypoglycemic Agents  Renal/GU negative Renal ROS     Musculoskeletal negative musculoskeletal ROS (+)   Abdominal (+) + obese,   Peds  Hematology negative hematology ROS (+)   Anesthesia Other Findings acute right knee medial meniscal tear  Reproductive/Obstetrics                           Anesthesia Physical Anesthesia Plan  ASA: III  Anesthesia Plan: General   Post-op Pain Management:    Induction:   PONV Risk Score and Plan: 3 and Ondansetron, Dexamethasone, Midazolam and Treatment may vary due to age or medical condition  Airway Management Planned: LMA  Additional Equipment:   Intra-op Plan:   Post-operative Plan: Extubation in OR  Informed Consent: I have reviewed the patients History and Physical, chart, labs and discussed the procedure including the risks, benefits and alternatives for the proposed anesthesia with the patient or authorized representative who has indicated his/her understanding and acceptance.     Dental advisory given  Plan Discussed with: CRNA  Anesthesia Plan Comments:        Anesthesia Quick Evaluation

## 2018-12-05 NOTE — Telephone Encounter (Signed)
Patient called asked what medication can she take in the morning. Patient said she is having surgery. Patient also said she did not get the antibacterial soap to bathe in tonight and tomorrow. The number to contact patient is [phone]

## 2018-12-06 ENCOUNTER — Ambulatory Visit (HOSPITAL_BASED_OUTPATIENT_CLINIC_OR_DEPARTMENT_OTHER)
Admission: RE | Admit: 2018-12-06 | Discharge: 2018-12-06 | Disposition: A | Discharge status: Home / Self Care | Payer: Medicare Other | Attending: Orthopaedic Surgery | Admitting: Orthopaedic Surgery

## 2018-12-06 ENCOUNTER — Encounter (HOSPITAL_BASED_OUTPATIENT_CLINIC_OR_DEPARTMENT_OTHER): Payer: Self-pay | Admitting: *Deleted

## 2018-12-06 ENCOUNTER — Encounter (HOSPITAL_BASED_OUTPATIENT_CLINIC_OR_DEPARTMENT_OTHER)
Admission: RE | Disposition: A | Discharge status: Home / Self Care | Payer: Self-pay | Source: Home / Self Care | Attending: Orthopaedic Surgery

## 2018-12-06 ENCOUNTER — Other Ambulatory Visit: Payer: Self-pay

## 2018-12-06 ENCOUNTER — Ambulatory Visit (HOSPITAL_BASED_OUTPATIENT_CLINIC_OR_DEPARTMENT_OTHER): Payer: Medicare Other | Admitting: Certified Registered"

## 2018-12-06 DIAGNOSIS — E119 Type 2 diabetes mellitus without complications: Secondary | ICD-10-CM | POA: Diagnosis not present

## 2018-12-06 DIAGNOSIS — Z79899 Other long term (current) drug therapy: Secondary | ICD-10-CM | POA: Insufficient documentation

## 2018-12-06 DIAGNOSIS — Z791 Long term (current) use of non-steroidal anti-inflammatories (NSAID): Secondary | ICD-10-CM | POA: Insufficient documentation

## 2018-12-06 DIAGNOSIS — S83241A Other tear of medial meniscus, current injury, right knee, initial encounter: Secondary | ICD-10-CM | POA: Insufficient documentation

## 2018-12-06 DIAGNOSIS — Z7984 Long term (current) use of oral hypoglycemic drugs: Secondary | ICD-10-CM | POA: Diagnosis not present

## 2018-12-06 DIAGNOSIS — Z7982 Long term (current) use of aspirin: Secondary | ICD-10-CM | POA: Diagnosis not present

## 2018-12-06 DIAGNOSIS — Y93G3 Activity, cooking and baking: Secondary | ICD-10-CM | POA: Diagnosis not present

## 2018-12-06 DIAGNOSIS — Y92096 Garden or yard of other non-institutional residence as the place of occurrence of the external cause: Secondary | ICD-10-CM | POA: Diagnosis not present

## 2018-12-06 DIAGNOSIS — K219 Gastro-esophageal reflux disease without esophagitis: Secondary | ICD-10-CM | POA: Insufficient documentation

## 2018-12-06 DIAGNOSIS — E785 Hyperlipidemia, unspecified: Secondary | ICD-10-CM | POA: Insufficient documentation

## 2018-12-06 DIAGNOSIS — J45909 Unspecified asthma, uncomplicated: Secondary | ICD-10-CM | POA: Insufficient documentation

## 2018-12-06 DIAGNOSIS — I10 Essential (primary) hypertension: Secondary | ICD-10-CM | POA: Insufficient documentation

## 2018-12-06 DIAGNOSIS — X501XXA Overexertion from prolonged static or awkward postures, initial encounter: Secondary | ICD-10-CM | POA: Insufficient documentation

## 2018-12-06 DIAGNOSIS — Z885 Allergy status to narcotic agent status: Secondary | ICD-10-CM | POA: Insufficient documentation

## 2018-12-06 HISTORY — PX: KNEE ARTHROSCOPY: SHX127

## 2018-12-06 LAB — GLUCOSE, CAPILLARY
Glucose-Capillary: 130 mg/dL — ABNORMAL HIGH (ref 70–99)
Glucose-Capillary: 152 mg/dL — ABNORMAL HIGH (ref 70–99)

## 2018-12-06 SURGERY — ARTHROSCOPY, KNEE
Anesthesia: General | Site: Knee | Laterality: Right

## 2018-12-06 MED ORDER — DEXAMETHASONE SODIUM PHOSPHATE 10 MG/ML IJ SOLN
INTRAMUSCULAR | Status: AC
  Filled 2018-12-06: qty 1

## 2018-12-06 MED ORDER — PHENYLEPHRINE HCL (PRESSORS) 10 MG/ML IV SOLN
INTRAVENOUS | Status: DC | PRN
  Administered 2018-12-06 (×2): 40 ug via INTRAVENOUS

## 2018-12-06 MED ORDER — CHLORHEXIDINE GLUCONATE 4 % EX LIQD: 60.0000 mL | Freq: Once | CUTANEOUS | Status: DC

## 2018-12-06 MED ORDER — CEFAZOLIN SODIUM-DEXTROSE 2-4 GM/100ML-% IV SOLN
INTRAVENOUS | Status: AC
  Filled 2018-12-06: qty 100

## 2018-12-06 MED ORDER — PROPOFOL 10 MG/ML IV BOLUS
INTRAVENOUS | Status: DC | PRN
  Administered 2018-12-06: 200 mg via INTRAVENOUS

## 2018-12-06 MED ORDER — OXYCODONE HCL 5 MG PO TABS
ORAL_TABLET | ORAL | Status: AC
  Filled 2018-12-06: qty 1

## 2018-12-06 MED ORDER — LIDOCAINE HCL (PF) 1 % IJ SOLN
INTRAMUSCULAR | Status: AC
  Filled 2018-12-06: qty 30

## 2018-12-06 MED ORDER — MIDAZOLAM HCL 2 MG/2ML IJ SOLN
INTRAMUSCULAR | Status: AC
  Filled 2018-12-06: qty 2

## 2018-12-06 MED ORDER — BUPIVACAINE HCL (PF) 0.5 % IJ SOLN
INTRAMUSCULAR | Status: DC | PRN
  Administered 2018-12-06: 20 mL

## 2018-12-06 MED ORDER — LACTATED RINGERS IV SOLN
INTRAVENOUS | Status: DC
  Administered 2018-12-06: 07:00:00 via INTRAVENOUS

## 2018-12-06 MED ORDER — PHENYLEPHRINE 40 MCG/ML (10ML) SYRINGE FOR IV PUSH (FOR BLOOD PRESSURE SUPPORT)
PREFILLED_SYRINGE | INTRAVENOUS | Status: AC
  Filled 2018-12-06: qty 10

## 2018-12-06 MED ORDER — BUPIVACAINE HCL (PF) 0.5 % IJ SOLN
INTRAMUSCULAR | Status: AC
  Filled 2018-12-06: qty 30

## 2018-12-06 MED ORDER — KETOROLAC TROMETHAMINE 15 MG/ML IJ SOLN: 15.0000 mg | Freq: Once | INTRAMUSCULAR | Status: DC | PRN

## 2018-12-06 MED ORDER — MORPHINE SULFATE (PF) 4 MG/ML IV SOLN
INTRAVENOUS | Status: DC | PRN
  Administered 2018-12-06: 4 mg

## 2018-12-06 MED ORDER — ONDANSETRON HCL 4 MG/2ML IJ SOLN
INTRAMUSCULAR | Status: DC | PRN
  Administered 2018-12-06: 4 mg via INTRAVENOUS

## 2018-12-06 MED ORDER — BUPIVACAINE HCL (PF) 0.25 % IJ SOLN
INTRAMUSCULAR | Status: AC
  Filled 2018-12-06: qty 30

## 2018-12-06 MED ORDER — LIDOCAINE 2% (20 MG/ML) 5 ML SYRINGE
INTRAMUSCULAR | Status: AC
  Filled 2018-12-06: qty 5

## 2018-12-06 MED ORDER — HYDROCODONE-ACETAMINOPHEN 5-325 MG PO TABS: 1.0000 | ORAL_TABLET | Freq: Four times a day (QID) | ORAL | 0 refills | Status: DC | PRN

## 2018-12-06 MED ORDER — PROPOFOL 10 MG/ML IV BOLUS
INTRAVENOUS | Status: AC
  Filled 2018-12-06: qty 20

## 2018-12-06 MED ORDER — FENTANYL CITRATE (PF) 100 MCG/2ML IJ SOLN
INTRAMUSCULAR | Status: AC
  Filled 2018-12-06: qty 2

## 2018-12-06 MED ORDER — EPHEDRINE SULFATE 50 MG/ML IJ SOLN
INTRAMUSCULAR | Status: DC | PRN
  Administered 2018-12-06: 10 mg via INTRAVENOUS

## 2018-12-06 MED ORDER — DEXAMETHASONE SODIUM PHOSPHATE 10 MG/ML IJ SOLN
INTRAMUSCULAR | Status: DC | PRN
  Administered 2018-12-06: 4 mg via INTRAVENOUS

## 2018-12-06 MED ORDER — FENTANYL CITRATE (PF) 100 MCG/2ML IJ SOLN
50.0000 ug | INTRAMUSCULAR | Status: DC | PRN
  Administered 2018-12-06 (×2): 25 ug via INTRAVENOUS

## 2018-12-06 MED ORDER — MIDAZOLAM HCL 2 MG/2ML IJ SOLN
1.0000 mg | INTRAMUSCULAR | Status: DC | PRN
  Administered 2018-12-06: 1 mg via INTRAVENOUS

## 2018-12-06 MED ORDER — MORPHINE SULFATE (PF) 4 MG/ML IV SOLN
INTRAVENOUS | Status: AC
  Filled 2018-12-06: qty 1

## 2018-12-06 MED ORDER — KETOROLAC TROMETHAMINE 30 MG/ML IJ SOLN
INTRAMUSCULAR | Status: AC
  Filled 2018-12-06: qty 1

## 2018-12-06 MED ORDER — KETOROLAC TROMETHAMINE 30 MG/ML IJ SOLN
INTRAMUSCULAR | Status: DC | PRN
  Administered 2018-12-06: 15 mg via INTRAVENOUS

## 2018-12-06 MED ORDER — CEFAZOLIN SODIUM-DEXTROSE 2-4 GM/100ML-% IV SOLN
2.0000 g | INTRAVENOUS | Status: AC
  Administered 2018-12-06: 2 g via INTRAVENOUS

## 2018-12-06 MED ORDER — SODIUM CHLORIDE 0.9 % IR SOLN
Status: DC | PRN
  Administered 2018-12-06: 1

## 2018-12-06 MED ORDER — ACETAMINOPHEN 500 MG PO TABS
1000.0000 mg | ORAL_TABLET | Freq: Once | ORAL | Status: AC
  Administered 2018-12-06: 1000 mg via ORAL

## 2018-12-06 MED ORDER — SCOPOLAMINE 1 MG/3DAYS TD PT72: 1.0000 | MEDICATED_PATCH | Freq: Once | TRANSDERMAL | Status: DC

## 2018-12-06 MED ORDER — LIDOCAINE HCL (CARDIAC) PF 100 MG/5ML IV SOSY
PREFILLED_SYRINGE | INTRAVENOUS | Status: DC | PRN
  Administered 2018-12-06: 60 mg via INTRAVENOUS

## 2018-12-06 MED ORDER — ACETAMINOPHEN 500 MG PO TABS
ORAL_TABLET | ORAL | Status: AC
  Filled 2018-12-06: qty 2

## 2018-12-06 MED ORDER — ONDANSETRON HCL 4 MG/2ML IJ SOLN
INTRAMUSCULAR | Status: AC
  Filled 2018-12-06: qty 2

## 2018-12-06 MED ORDER — FENTANYL CITRATE (PF) 100 MCG/2ML IJ SOLN
25.0000 ug | INTRAMUSCULAR | Status: DC | PRN
  Administered 2018-12-06 (×2): 50 ug via INTRAVENOUS

## 2018-12-06 MED ORDER — OXYCODONE HCL 5 MG PO TABS
5.0000 mg | ORAL_TABLET | Freq: Once | ORAL | Status: AC
  Administered 2018-12-06: 10:00:00 5 mg via ORAL

## 2018-12-06 MED ORDER — ONDANSETRON HCL 4 MG/2ML IJ SOLN: 4.0000 mg | Freq: Once | INTRAMUSCULAR | Status: DC | PRN

## 2018-12-06 SURGICAL SUPPLY — 37 items
BLADE EXCALIBUR 4.0X13 (MISCELLANEOUS) IMPLANT
BNDG ELASTIC 6X5.8 VLCR STR LF (GAUZE/BANDAGES/DRESSINGS) ×3 IMPLANT
COVER WAND RF STERILE (DRAPES) IMPLANT
DISSECTOR  3.8MM X 13CM (MISCELLANEOUS) ×1
DISSECTOR 3.8MM X 13CM (MISCELLANEOUS) IMPLANT
DRAPE ARTHROSCOPY W/POUCH 90 (DRAPES) ×3 IMPLANT
DRAPE U-SHAPE 47X51 STRL (DRAPES) ×3 IMPLANT
DRSG PAD ABDOMINAL 8X10 ST (GAUZE/BANDAGES/DRESSINGS) ×3 IMPLANT
DURAPREP 26ML APPLICATOR (WOUND CARE) ×3 IMPLANT
ELECT MENISCUS 165MM 90D (ELECTRODE) IMPLANT
ELECT REM PT RETURN 9FT ADLT (ELECTROSURGICAL) ×3
ELECTRODE REM PT RTRN 9FT ADLT (ELECTROSURGICAL) IMPLANT
EXCALIBUR 3.8MM X 13CM (MISCELLANEOUS) IMPLANT
GAUZE SPONGE 4X4 12PLY STRL (GAUZE/BANDAGES/DRESSINGS) ×3 IMPLANT
GAUZE XEROFORM 1X8 LF (GAUZE/BANDAGES/DRESSINGS) ×3 IMPLANT
GLOVE BIO SURGEON STRL SZ 6.5 (GLOVE) ×2 IMPLANT
GLOVE BIOGEL PI IND STRL 7.0 (GLOVE) IMPLANT
GLOVE BIOGEL PI IND STRL 8 (GLOVE) ×4 IMPLANT
GLOVE BIOGEL PI INDICATOR 7.0 (GLOVE) ×3
GLOVE BIOGEL PI INDICATOR 8 (GLOVE) ×2
GLOVE ORTHO TXT STRL SZ7.5 (GLOVE) ×3 IMPLANT
GLOVE SURG ORTHO 8.0 STRL STRW (GLOVE) ×3 IMPLANT
GOWN STRL REUS W/ TWL LRG LVL3 (GOWN DISPOSABLE) ×4 IMPLANT
GOWN STRL REUS W/ TWL XL LVL3 (GOWN DISPOSABLE) ×2 IMPLANT
GOWN STRL REUS W/TWL LRG LVL3 (GOWN DISPOSABLE) ×6
GOWN STRL REUS W/TWL XL LVL3 (GOWN DISPOSABLE) ×9
KNEE WRAP E Z 3 GEL PACK (MISCELLANEOUS) ×3 IMPLANT
MANIFOLD NEPTUNE II (INSTRUMENTS) ×1 IMPLANT
PACK ARTHROSCOPY DSU (CUSTOM PROCEDURE TRAY) ×3 IMPLANT
PACK BASIN DAY SURGERY FS (CUSTOM PROCEDURE TRAY) ×3 IMPLANT
PACK ICE MAXI GEL EZY WRAP (MISCELLANEOUS) ×1 IMPLANT
PADDING CAST COTTON 6X4 STRL (CAST SUPPLIES) ×3 IMPLANT
PENCIL BUTTON HOLSTER BLD 10FT (ELECTRODE) IMPLANT
SUT ETHILON 3 0 PS 1 (SUTURE) ×3 IMPLANT
TOWEL GREEN STERILE FF (TOWEL DISPOSABLE) ×3 IMPLANT
TUBING ARTHROSCOPY IRRIG 16FT (MISCELLANEOUS) ×3 IMPLANT
WATER STERILE IRR 1000ML POUR (IV SOLUTION) ×3 IMPLANT

## 2018-12-06 NOTE — Anesthesia Postprocedure Evaluation (Signed)
Anesthesia Post Note  Patient: Carla Oconnor  Procedure(s) Performed: RIGHT KNEE ARTHROSCOPY WITH SYNOVECTOMY/DEBRIDEMENT (Right Knee)     Patient location during evaluation: PACU Anesthesia Type: General Level of consciousness: awake and alert Pain management: pain level controlled Vital Signs Assessment: post-procedure vital signs reviewed and stable Respiratory status: spontaneous breathing, nonlabored ventilation, respiratory function stable and patient connected to nasal cannula oxygen Cardiovascular status: blood pressure returned to baseline and stable Postop Assessment: no apparent nausea or vomiting Anesthetic complications: no    Last Vitals:  Vitals:   12/06/18 0915 12/06/18 0945  BP: 137/81 (!) 160/74  Pulse: 70 73  Resp: 11 18  Temp:  (!) 36.3 C  SpO2: 100% 97%    Last Pain:  Vitals:   12/06/18 0945  TempSrc:   PainSc: 4                  Ryan P Ellender

## 2018-12-06 NOTE — Discharge Instructions (Signed)
You may increase your activities as comfort allows. You may put full weight as you tolerate on your right knee and leg. Today you should stay off of your leg is much as possible but you can put weight on your leg to go to the bathroom and to get food. Do expect swelling.  Ice and elevation on and off as needed throughout today in the next several days. You can actually remove your dressings tomorrow and shower getting your incisions wet. You may place Band-Aids over your incisions daily after each shower.    Post Anesthesia Home Care Instructions  Activity: Get plenty of rest for the remainder of the day. A responsible individual must stay with you for 24 hours following the procedure.  For the next 24 hours, DO NOT: -Drive a car -Paediatric nurse -Drink alcoholic beverages -Take any medication unless instructed by your physician -Make any legal decisions or sign important papers.  Meals: Start with liquid foods such as gelatin or soup. Progress to regular foods as tolerated. Avoid greasy, spicy, heavy foods. If nausea and/or vomiting occur, drink only clear liquids until the nausea and/or vomiting subsides. Call your physician if vomiting continues.  Special Instructions/Symptoms: Your throat may feel dry or sore from the anesthesia or the breathing tube placed in your throat during surgery. If this causes discomfort, gargle with warm salt water. The discomfort should disappear within 24 hours.  If you had a scopolamine patch placed behind your ear for the management of post- operative nausea and/or vomiting:  1. The medication in the patch is effective for 72 hours, after which it should be removed.  Wrap patch in a tissue and discard in the trash. Wash hands thoroughly with soap and water. 2. You may remove the patch earlier than 72 hours if you experience unpleasant side effects which may include dry mouth, dizziness or visual disturbances. 3. Avoid touching the patch. Wash your  hands with soap and water after contact with the patch.   You received TYLENOL 1000 mg at 7:00 am and may repeat tylenol after 1 pm if needed. You received TORADOL 15 mg at 8:00 am and may repeat ibuprofen (advil, motrin etc) after 2:00 pm if needed.

## 2018-12-06 NOTE — H&P (Signed)
Carla Oconnor is an 68 y.o. female.   Chief Complaint:   Right knee pain, swelling, locking, catching HPI:   68 yo female who sustained an acute right knee medial meniscal tear after a twisting injury to her right knee while cooking outside a few months ago.  After right knee swelling, pain with locking and catching, a MRI was obtained showing an acute medial meniscal tear and no significant cartilage defects in the knee.  After the failure of conservative treatment, the patient elected to proceed with a right knee arthroscopy.  Past Medical History:  Diagnosis Date  . Asthma    as child and seasonal  . Diabetes mellitus without complication ([place])   . Fibrocystic breast   . GERD (gastroesophageal reflux disease)   . Hyperlipidemia   . Hypertension   . MRSA infection 2008   right arm  . Neuromuscular disorder ([place])    tennis elbow on right and left arm  . S/P hysterectomy     Past Surgical History:  Procedure Laterality Date  . ABDOMINAL HYSTERECTOMY     2003- still has left ovary  . BREAST EXCISIONAL BIOPSY    . BREAST SURGERY     biopsy rt breast-1960- benign.Left breast 1975  - lumpectomy - benign  . CESAREAN SECTION    . COLONOSCOPY    . TRIGGER FINGER RELEASE  02/16/2012   Procedure: RELEASE TRIGGER FINGER/A-1 PULLEY;  Surgeon: Cammie Sickle., MD;  Location: [facility];  Service: Orthopedics;  Laterality: Right;  RIGHT RING FINGER     Family History  Problem Relation Age of Onset  . Stroke Father   . Breast cancer Sister   . Asthma Other   . Hypertension Other   . Cancer Other   . Diabetes Other   . Hyperlipidemia Other    Social History:  reports that she has never smoked. She has never used smokeless tobacco. She reports current alcohol use. She reports that she does not use drugs.  Allergies:  Allergies  Allergen Reactions  . Tramadol Hives and Itching  . Percocet [Oxycodone-Acetaminophen] Itching    Medications Prior to Admission   Medication Sig Dispense Refill  . aspirin 81 MG tablet Take 81 mg by mouth 2 (two) times daily.     Marland Kitchen BAYER CONTOUR TEST test strip USE AS DIRECTED 100 each 6  . cholecalciferol (VITAMIN D) 1000 UNITS tablet Take 1,000 Units by mouth daily.     . clonazePAM (KLONOPIN) 0.5 MG tablet TAKE 1 TABLET BY MOUTH EVERY NIGHT AT BEDTIME AS NEEDED FOR SLEEP 90 tablet 0  . cyclobenzaprine (FLEXERIL) 10 MG tablet Take 1 tablet (10 mg total) by mouth at bedtime. 30 tablet 0  . Fingerstix Lancets MISC Check glucose 4-5 TIMES a day. DISPENSE BAYER TEST STRIPS AND LANCETS 200 each 11  . glipiZIDE (GLUCOTROL XL) 10 MG 24 hr tablet TAKE 1 TABLET(10 MG) BY MOUTH DAILY WITH BREAKFAST 90 tablet 3  . glucose blood test strip Check glucose 4-5 a day. DISPENSE BAYER TEST STRIPS AND LANCETS 100 each 12  . ibuprofen (ADVIL,MOTRIN) 800 MG tablet Take 1 tablet (800 mg total) by mouth every 8 (eight) hours as needed. 90 tablet 1  . metFORMIN (GLUCOPHAGE) 1000 MG tablet TAKE 1 TABLET BY MOUTH TWICE DAILY WITH MEALS 60 tablet 5  . Multiple Vitamin (MULTIVITAMIN WITH MINERALS) TABS tablet Take 1 tablet by mouth daily.    Marland Kitchen omeprazole (PRILOSEC) 20 MG capsule TAKE 1 CAPSULE(20 MG) BY  MOUTH DAILY 90 capsule 3  . potassium chloride SA (K-DUR,KLOR-CON) 20 MEQ tablet TAKE 2 TABLETS BY MOUTH EVERY DAY. 180 tablet 3  . rosuvastatin (CRESTOR) 20 MG tablet TAKE 1 TABLET BY MOUTH EVERY DAY 90 tablet 3  . valsartan-hydrochlorothiazide (DIOVAN-HCT) 160-25 MG tablet TAKE 1 TABLET BY MOUTH EVERY DAY 90 tablet 3  . albuterol (PROVENTIL HFA;VENTOLIN HFA) 108 (90 Base) MCG/ACT inhaler Inhale 2 puffs into the lungs every 6 (six) hours as needed for wheezing or shortness of breath. 18 g 11  . meloxicam (MOBIC) 15 MG tablet Take 1 tablet (15 mg total) by mouth daily. 30 tablet 0  . valACYclovir (VALTREX) 500 MG tablet Take 500 mg by mouth 2 (two) times daily as needed (breakouts).       No results found for this or any previous visit (from the  past 48 hour(s)). No results found.  Review of Systems  Musculoskeletal: Positive for joint pain.  All other systems reviewed and are negative.   Blood pressure (!) 152/71, pulse 76, temperature 97.8 F (36.6 C), temperature source Oral, resp. rate 18, height 5\' 2"  (1.575 m), weight 88 kg, SpO2 100 %. Physical Exam  Constitutional: She is oriented to person, place, and time. She appears well-developed and well-nourished.  HENT:  Head: Normocephalic and atraumatic.  Eyes: Pupils are equal, round, and reactive to light. EOM are normal.  Neck: Normal range of motion. Neck supple.  Cardiovascular: Normal rate and regular rhythm.  Respiratory: Effort normal and breath sounds normal.  GI: Soft. Bowel sounds are normal.  Musculoskeletal:     Right knee: She exhibits decreased range of motion, swelling, effusion and abnormal meniscus. Tenderness found. Medial joint line tenderness noted.  Neurological: She is alert and oriented to person, place, and time.  Skin: Skin is warm and dry.  Psychiatric: She has a normal mood and affect.     Assessment/Plan Right knee medial meniscal tear  To the OR today as an outpatient for a right knee arthroscopy.  The risks and benefits of surgery have been discussed in detail and informed consent is obtained.  Kathryne Hitchhristopher Y Bomani Oommen, MD 12/06/2018, 7:09 AM

## 2018-12-06 NOTE — Brief Op Note (Signed)
12/06/2018  8:09 AM  PATIENT:  Carla Oconnor  68 y.o. female  PRE-OPERATIVE DIAGNOSIS:  acute right knee medial meniscal tear  POST-OPERATIVE DIAGNOSIS:  Synovitis Right Knee  PROCEDURE:  Procedure(s): RIGHT KNEE ARTHROSCOPY WITH SYNOVECTOMY/DEBRIDEMENT (Right)  SURGEON:  Surgeon(s) and Role:    Mcarthur Rossetti, MD - Primary  PHYSICIAN ASSISTANT: Benita Stabile, PA-C  ANESTHESIA:   local and general  EBL:  5 mL   COUNTS:  YES  DICTATION: .Other Dictation: Dictation Number [id number]  PLAN OF CARE: Discharge to home after PACU  PATIENT DISPOSITION:  PACU - hemodynamically stable.   Delay start of Pharmacological VTE agent (>24hrs) due to surgical blood loss or risk of bleeding: no

## 2018-12-06 NOTE — Op Note (Signed)
NAME: Carla Oconnor, Carla Oconnor MEDICAL RECORD [id number] ACCOUNT [id number] DATE OF BIRTH:15-Nov-1950 FACILITY: [facility] LOCATION: [facility] PHYSICIAN:Kashmir Leedy Kerry Fort, MD  OPERATIVE REPORT  DATE OF PROCEDURE:  12/06/2018  PREOPERATIVE DIAGNOSIS:  Right knee medial meniscal tear.  POSTOPERATIVE DIAGNOSIS:  Right knee small meniscal root tear.  PROCEDURE:  Right knee arthroscopy with minimal debridement.  SURGEON:  Lind Guest. Ninfa Linden, MD  ASSISTANT:  Erskine Emery, PA-C  ANESTHESIA: 1.  General. 2.  Local with a mixture of morphine and Marcaine into the right knee joint.  ANTIBIOTICS:  Two grams IV Ancef.  ESTIMATED BLOOD LOSS:  Minimal.  COMPLICATIONS:  None.  INDICATIONS:  The patient is a very pleasant 68 year old female with no previous right knee issues until she twisted her right knee while she was cooking in her yard.  She has had locking and catching since this with recurrent effusions and knee pain.   After very conservative treatment including activity modification, anti-inflammatories, and steroid injections, an MRI was obtained that showed a meniscal root tear that did extend into the meniscus more so.  After continued problems with her knee, it  was recommended she undergo an arthroscopic intervention.  The risks and benefits of surgery were explained to her in detail, and she did wish to proceed given the failure of conservative treatment and her continued mechanical symptoms.  DESCRIPTION OF PROCEDURE:  After informed consent was obtained and appropriate right knee was marked, she was brought to the operating room and placed on the operating table.  General anesthesia was then obtained.  Her right knee was prepped and draped  from the thigh down to the ankle with DuraPrep and sterile drapes including a sterile stockinette with a lateral leg post utilized.  The right operative knee was flexed off the side of the table.  A time-out was called, and she was  identified as correct  patient, correct right knee.  I then made an anterior lateral arthroscopy portal and inserted a cannula into the knee.  We did not find any significant effusion at all.  I placed the camera in the knee and then went to the medial compartment and made an  anterior medial incision.  From there, we were able to assess the medial meniscus with a probe and found this to be just a small meniscal root tear that did not extend any further.  We were able to just perform a minimal debridement in this area and it  was absolutely stable.  The cartilage on the medial femoral condyle and the medial tibial plateau had minimal wear with no full-thickness cartilage deficits at all or defects.  We assessed the intercondylar area and found the ACL and PCL to be intact.   There was some inflammation at Hoffa's fat pad, which we debrided back.  We went to the lateral compartment with the knee in a figure-of-four position and found the lateral compartment to be pristine.  Finally, we assessed the patellofemoral joint and  found some chondromalacia of the patella and the trochlear groove, but this was minimal.  We debrided this back to a stable margin.  We then allowed fluid lavage of the knee and drained fluid from the knee.  We closed the arthroscopy portal sites with  interrupted nylon suture.  We then inserted a mixture of morphine and Marcaine into the knee joint and the portal sites.  A Xeroform well-padded sterile dressing was applied.  She was awakened, extubated, and taken to recovery room in stable condition.   All  final counts were correct.  There were no complications noted.  Postoperatively, we will allow her to increase her activities, weightbearing as tolerated, and see her back in the office in a week.  LN/NUANCE  D:12/06/2018 T:12/06/2018 JOB:[id number]

## 2018-12-06 NOTE — Anesthesia Procedure Notes (Signed)
Procedure Name: LMA Insertion Date/Time: 12/06/2018 7:34 AM Performed by: Lavonia Dana, CRNA Pre-anesthesia Checklist: Patient identified, Emergency Drugs available, Suction available and Patient being monitored Patient Re-evaluated:Patient Re-evaluated prior to induction Oxygen Delivery Method: Circle system utilized Preoxygenation: Pre-oxygenation with 100% oxygen Induction Type: IV induction Ventilation: Mask ventilation without difficulty LMA: LMA inserted LMA Size: 4.0 Number of attempts: 1 Airway Equipment and Method: Bite block Placement Confirmation: positive ETCO2 Tube secured with: Tape Dental Injury: Teeth and Oropharynx as per pre-operative assessment

## 2018-12-06 NOTE — Transfer of Care (Signed)
Immediate Anesthesia Transfer of Care Note  Patient: Carla Oconnor  Procedure(s) Performed: RIGHT KNEE ARTHROSCOPY WITH SYNOVECTOMY/DEBRIDEMENT (Right Knee)  Patient Location: PACU  Anesthesia Type:General  Level of Consciousness: drowsy  Airway & Oxygen Therapy: Breathing spontaneously and connected to facemask oxygen  Post-op Assessment: Report given to RN and Post -op Vital signs reviewed and stable  Post vital signs: Reviewed and stable  Last Vitals:  Vitals Value Taken Time  BP 140/77 12/06/18 0816  Temp 36.6 C 12/06/18 0817  Pulse 79 12/06/18 0821  Resp 12 12/06/18 0821  SpO2 100 % 12/06/18 0821  Vitals shown include unvalidated device data.  Last Pain:  Vitals:   12/06/18 0816  TempSrc:   PainSc: 0-No pain      Patients Stated Pain Goal: 3 (59/56/38 [id number])  Complications: No apparent anesthesia complications

## 2018-12-06 NOTE — Telephone Encounter (Signed)
I talked to patient and let her know what not to take And told her to get the wash

## 2018-12-07 ENCOUNTER — Encounter (HOSPITAL_BASED_OUTPATIENT_CLINIC_OR_DEPARTMENT_OTHER): Payer: Self-pay | Admitting: Orthopaedic Surgery

## 2018-12-12 ENCOUNTER — Ambulatory Visit (INDEPENDENT_AMBULATORY_CARE_PROVIDER_SITE_OTHER): Payer: Medicare Other | Admitting: Orthopaedic Surgery

## 2018-12-12 ENCOUNTER — Encounter: Payer: Self-pay | Admitting: Orthopaedic Surgery

## 2018-12-12 DIAGNOSIS — Z9889 Other specified postprocedural states: Secondary | ICD-10-CM | POA: Insufficient documentation

## 2018-12-12 NOTE — Progress Notes (Signed)
HPI: [name] 68 year old female returns today 1 week status post right knee arthroscopy with synovectomy debridement.  She was found to have a stable small root tear that was not debrided.  She is overall doing well.  She has had no chest pain shortness of breath.  She is open sore.  She is asking for a knee brace.  Physical exam: Right knee port sites well approximated with nylon sutures no signs of infection.  Right calf supple nontender.  Good range of motion right knee without significant pain.  Ambulates without any assistive device.  Impression: 1 week status post right knee arthroscopy with synovectomy and debridement.  Plan: Reviewed pictures from the arthroscopy with the patient showing that she had nearly pristine compartments outside of the root tear and some minimal inflammation particularly of Hoffa's fat pad.  Expect a full recovery.  She will work on scar tissue mobilization sutures removed today.  She is not to submerge the knee in water until the wounds are completely healed.  We will see her back in 1 month.  She was given a pullover open patella knee sleeve.

## 2018-12-19 ENCOUNTER — Telehealth: Payer: Self-pay | Admitting: Orthopaedic Surgery

## 2018-12-19 NOTE — Telephone Encounter (Signed)
Whether it was mis-sized or it's just her body habitus affecting how it fits, we should trade it out for a smaller one.  We will need to ask the rep how to do that as far as stock/billing etc.  I would think as far as patient goes we can go ahead and refit her in a smaller one.  Keep the one she was given first set aside and have [place] email Thurmond Butts or Linton Rump to inquire.   I thought we did not have actual knee sleeves any more, and we just had hinged knee braces?  If you have knee sleeves, do they bill through [employer]? Or [place]?  I am not sure.

## 2018-12-19 NOTE — Telephone Encounter (Signed)
Patient called stating that the knee brace will not stay up on her knee when she walks.  Patient wanted to know what to do.  CB#[phone].  Thank you.

## 2018-12-19 NOTE — Telephone Encounter (Signed)
She got a knee sleeve, not really sure how to handle this?

## 2018-12-21 NOTE — Telephone Encounter (Signed)
Patient aware to swing by and we can swap this out for her

## 2019-01-09 ENCOUNTER — Ambulatory Visit (INDEPENDENT_AMBULATORY_CARE_PROVIDER_SITE_OTHER): Payer: Medicare Other | Admitting: Orthopaedic Surgery

## 2019-01-09 ENCOUNTER — Encounter: Payer: Self-pay | Admitting: Orthopaedic Surgery

## 2019-01-09 DIAGNOSIS — Z9889 Other specified postprocedural states: Secondary | ICD-10-CM

## 2019-01-09 NOTE — Progress Notes (Signed)
HPI: [name] returns today follow-up status post right knee arthroscopy with extensive debridement and synovectomy.  She is overall doing well.  She states her swelling is decreasing.  She does note a few days ago she was picking up a heavy box and had some pain in the knee posterior aspect of both thighs.  No particular injury.  His pain states pain.  Physical exam: Right knee she has full extension full flexion no instability valgus varus stressing.  Port sites are well-healed no signs of infection.  Right calf supple nontender.  She has minimal tenderness over her proximal Achilles no palpable defect.  Impression: Status post right knee arthroscopy  Plan: At her request we will send her to formal physical therapy to work on range of motion home exercise program.  She is encouraged to work on Forensic scientist.  She will follow-up with [place] on as-needed basis pain persist or becomes worse.

## 2019-02-08 ENCOUNTER — Ambulatory Visit (INDEPENDENT_AMBULATORY_CARE_PROVIDER_SITE_OTHER): Payer: Medicare Other | Admitting: Internal Medicine

## 2019-02-08 ENCOUNTER — Other Ambulatory Visit: Payer: Self-pay

## 2019-02-08 ENCOUNTER — Encounter: Payer: Self-pay | Admitting: Internal Medicine

## 2019-02-08 VITALS — BP 120/60 | HR 93 | Temp 98.0°F | Ht 62.0 in | Wt 194.0 lb

## 2019-02-08 DIAGNOSIS — Z23 Encounter for immunization: Secondary | ICD-10-CM

## 2019-02-08 NOTE — Progress Notes (Signed)
Flu vaccine by CMA 

## 2019-02-08 NOTE — Patient Instructions (Signed)
Patient received a flu vaccine IM L deltoid, AV, CMA  

## 2019-02-27 ENCOUNTER — Other Ambulatory Visit: Payer: Self-pay | Admitting: Internal Medicine

## 2019-03-13 ENCOUNTER — Other Ambulatory Visit: Payer: Self-pay

## 2019-03-13 ENCOUNTER — Ambulatory Visit (INDEPENDENT_AMBULATORY_CARE_PROVIDER_SITE_OTHER): Payer: Medicare Other | Admitting: Internal Medicine

## 2019-03-13 ENCOUNTER — Telehealth: Payer: Self-pay

## 2019-03-13 VITALS — BP 137/64 | HR 78 | Temp 97.6°F | Ht 62.0 in | Wt 194.0 lb

## 2019-03-13 DIAGNOSIS — E785 Hyperlipidemia, unspecified: Secondary | ICD-10-CM

## 2019-03-13 DIAGNOSIS — R0789 Other chest pain: Secondary | ICD-10-CM | POA: Diagnosis not present

## 2019-03-13 DIAGNOSIS — F411 Generalized anxiety disorder: Secondary | ICD-10-CM

## 2019-03-13 DIAGNOSIS — E1169 Type 2 diabetes mellitus with other specified complication: Secondary | ICD-10-CM | POA: Diagnosis not present

## 2019-03-13 DIAGNOSIS — I10 Essential (primary) hypertension: Secondary | ICD-10-CM

## 2019-03-13 MED ORDER — MELOXICAM 15 MG PO TABS: 15.0000 mg | ORAL_TABLET | Freq: Every day | ORAL | 0 refills | Status: DC

## 2019-03-13 NOTE — Patient Instructions (Signed)
Meloxicam 15 mg daily for 7- 10 days. Ice or heat to sore chest area. Do not move or lift heavy items for 7 to 10 days

## 2019-03-13 NOTE — Telephone Encounter (Signed)
Virtual visit 

## 2019-03-13 NOTE — Telephone Encounter (Signed)
Patient called she was moving some furniture yesterday and her muscles around her heart starting hurting, she said she does not have sob, dizziness or headache. She wants to know if you can see her?   [phone]

## 2019-03-13 NOTE — Progress Notes (Signed)
   Subjective:    Patient ID: Carla Oconnor, female    DOB: 24-Dec-1950, 68 y.o.   MRN: [id number]  HPI 68 year old Female seen today by interactive audio and video telecommunications due to the Coronavirus pandemic.  She is agreeable to visit in this format today.  She is identified using 2 identifiers as Carla Oconnor, a patient in this practice.  Patient seen at her home virtually.  Apparently she was moving furniture recently because she has had work done on her hardwood floors.  Last evening noticed some pain in her chest that was alarming to her.  She has no shortness of breath.  No cough or fever.  It was hard for her to fall asleep due to the pain.  She has no cardiac history.  She has history of anxiety.  In 2014 she had a normal cardiac Myoview study with no evidence of ischemia or infarction.  She has a history of hypertension and hyperlipidemia.  History of GE reflux.  History of diabetes mellitus.    Review of Systems     Objective:   Physical Exam Patient took vital signs at home.  These are reviewed and are stable.  She is seen virtually in no acute distress.  Complaining of pain right parasternal area.  I have today asked her to press on the ribs in that area to see if they are tender particularly along the cartilage attaching ribs to sternum.  Indeed she has point tenderness along several ribs on the right       Assessment & Plan:  It is my feeling that she has chest wall pain is not cardiac pain.  Anxiety state   Plan: She will be prescribed meloxicam 15 mg take 1 p.o. daily #30 with no refill.  Would expect improvement in 48 to 72 hours.  Avoid heavy lifting pushing and pulling furniture etc. expect improvement soon.  She was reassured.

## 2019-03-15 ENCOUNTER — Encounter: Payer: Self-pay | Admitting: Internal Medicine

## 2019-03-27 ENCOUNTER — Other Ambulatory Visit: Payer: Self-pay | Admitting: Internal Medicine

## 2019-03-27 DIAGNOSIS — Z1231 Encounter for screening mammogram for malignant neoplasm of breast: Secondary | ICD-10-CM

## 2019-04-03 ENCOUNTER — Telehealth: Payer: Self-pay | Admitting: Internal Medicine

## 2019-04-03 NOTE — Telephone Encounter (Signed)
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 She has an appointment next week so she will discuss at that time.

## 2019-04-04 ENCOUNTER — Other Ambulatory Visit: Payer: Medicare Other | Admitting: Internal Medicine

## 2019-04-04 ENCOUNTER — Other Ambulatory Visit: Payer: Self-pay

## 2019-04-04 DIAGNOSIS — Z1329 Encounter for screening for other suspected endocrine disorder: Secondary | ICD-10-CM

## 2019-04-04 DIAGNOSIS — E119 Type 2 diabetes mellitus without complications: Secondary | ICD-10-CM

## 2019-04-04 DIAGNOSIS — E785 Hyperlipidemia, unspecified: Secondary | ICD-10-CM

## 2019-04-04 DIAGNOSIS — Z87898 Personal history of other specified conditions: Secondary | ICD-10-CM

## 2019-04-04 DIAGNOSIS — E8881 Metabolic syndrome: Secondary | ICD-10-CM

## 2019-04-04 DIAGNOSIS — Z0000 Encounter for general adult medical examination without abnormal findings: Secondary | ICD-10-CM

## 2019-04-04 DIAGNOSIS — I10 Essential (primary) hypertension: Secondary | ICD-10-CM

## 2019-04-04 DIAGNOSIS — M542 Cervicalgia: Secondary | ICD-10-CM

## 2019-04-04 DIAGNOSIS — E1169 Type 2 diabetes mellitus with other specified complication: Secondary | ICD-10-CM

## 2019-04-05 LAB — CBC WITH DIFFERENTIAL/PLATELET
Absolute Monocytes: 518 cells/uL (ref 200–950)
Basophils Absolute: 48 cells/uL (ref 0–200)
Basophils Relative: 1 %
Eosinophils Absolute: 130 cells/uL (ref 15–500)
Eosinophils Relative: 2.7 %
HCT: 34 % — ABNORMAL LOW (ref 35.0–45.0)
Hemoglobin: 11.3 g/dL — ABNORMAL LOW (ref 11.7–15.5)
Lymphs Abs: 2102 cells/uL (ref 850–3900)
MCH: 30 pg (ref 27.0–33.0)
MCHC: 33.2 g/dL (ref 32.0–36.0)
MCV: 90.2 fL (ref 80.0–100.0)
MPV: 9.8 fL (ref 7.5–12.5)
Monocytes Relative: 10.8 %
Neutro Abs: 2002 cells/uL (ref 1500–7800)
Neutrophils Relative %: 41.7 %
Platelets: 266 10*3/uL (ref 140–400)
RBC: 3.77 10*6/uL — ABNORMAL LOW (ref 3.80–5.10)
RDW: 12.9 % (ref 11.0–15.0)
Total Lymphocyte: 43.8 %
WBC: 4.8 10*3/uL (ref 3.8–10.8)

## 2019-04-05 LAB — LIPID PANEL
Cholesterol: 301 mg/dL — ABNORMAL HIGH (ref <200)
HDL: 48 mg/dL — ABNORMAL LOW (ref >=50)
LDL Cholesterol (Calc): 217 mg/dL (calc) — ABNORMAL HIGH
Non-HDL Cholesterol (Calc): 253 mg/dL (calc) — ABNORMAL HIGH (ref <130)
Total CHOL/HDL Ratio: 6.3 (calc) — ABNORMAL HIGH (ref <5.0)
Triglycerides: 188 mg/dL — ABNORMAL HIGH (ref <150)

## 2019-04-05 LAB — COMPLETE METABOLIC PANEL WITH GFR
AG Ratio: 1.7 (calc) (ref 1.0–2.5)
ALT: 13 U/L (ref 6–29)
AST: 15 U/L (ref 10–35)
Albumin: 4.2 g/dL (ref 3.6–5.1)
Alkaline phosphatase (APISO): 62 U/L (ref 37–153)
BUN/Creatinine Ratio: 25 (calc) — ABNORMAL HIGH (ref 6–22)
BUN: 25 mg/dL (ref 7–25)
CO2: 31 mmol/L (ref 20–32)
Calcium: 9.8 mg/dL (ref 8.6–10.4)
Chloride: 102 mmol/L (ref 98–110)
Creat: 1 mg/dL — ABNORMAL HIGH (ref 0.50–0.99)
GFR, Est African American: 67 mL/min/{1.73_m2} (ref >=60)
GFR, Est Non African American: 57 mL/min/{1.73_m2} — ABNORMAL LOW (ref >=60)
Globulin: 2.5 g/dL (calc) (ref 1.9–3.7)
Glucose, Bld: 142 mg/dL — ABNORMAL HIGH (ref 65–99)
Potassium: 4.2 mmol/L (ref 3.5–5.3)
Sodium: 142 mmol/L (ref 135–146)
Total Bilirubin: 0.5 mg/dL (ref 0.2–1.2)
Total Protein: 6.7 g/dL (ref 6.1–8.1)

## 2019-04-05 LAB — HEMOGLOBIN A1C
Hgb A1c MFr Bld: 6.8 % of total Hgb — ABNORMAL HIGH (ref <5.7)
Mean Plasma Glucose: 148 (calc)
eAG (mmol/L): 8.2 (calc)

## 2019-04-05 LAB — TSH: TSH: 2.78 mIU/L (ref 0.40–4.50)

## 2019-04-09 ENCOUNTER — Ambulatory Visit (INDEPENDENT_AMBULATORY_CARE_PROVIDER_SITE_OTHER): Payer: Medicare Other | Admitting: Internal Medicine

## 2019-04-09 ENCOUNTER — Encounter: Payer: Self-pay | Admitting: Internal Medicine

## 2019-04-09 ENCOUNTER — Other Ambulatory Visit: Payer: Self-pay

## 2019-04-09 VITALS — BP 128/80 | HR 83 | Ht 62.0 in | Wt 194.0 lb

## 2019-04-09 DIAGNOSIS — Z6835 Body mass index (BMI) 35.0-35.9, adult: Secondary | ICD-10-CM

## 2019-04-09 DIAGNOSIS — I10 Essential (primary) hypertension: Secondary | ICD-10-CM | POA: Diagnosis not present

## 2019-04-09 DIAGNOSIS — F411 Generalized anxiety disorder: Secondary | ICD-10-CM

## 2019-04-09 DIAGNOSIS — E1169 Type 2 diabetes mellitus with other specified complication: Secondary | ICD-10-CM

## 2019-04-09 DIAGNOSIS — Z8709 Personal history of other diseases of the respiratory system: Secondary | ICD-10-CM | POA: Diagnosis not present

## 2019-04-09 DIAGNOSIS — Z0000 Encounter for general adult medical examination without abnormal findings: Secondary | ICD-10-CM

## 2019-04-09 DIAGNOSIS — E785 Hyperlipidemia, unspecified: Secondary | ICD-10-CM

## 2019-04-09 LAB — POCT URINALYSIS DIPSTICK
Appearance: NEGATIVE
Bilirubin, UA: NEGATIVE
Blood, UA: NEGATIVE
Glucose, UA: NEGATIVE
Ketones, UA: NEGATIVE
Leukocytes, UA: NEGATIVE
Nitrite, UA: NEGATIVE
Odor: NEGATIVE
Protein, UA: NEGATIVE
Spec Grav, UA: 1.01 (ref 1.010–1.025)
Urobilinogen, UA: 0.2 E.U./dL
pH, UA: 6.5 (ref 5.0–8.0)

## 2019-04-09 MED ORDER — ROSUVASTATIN CALCIUM 10 MG PO TABS: 10.0000 mg | ORAL_TABLET | Freq: Every day | ORAL | 3 refills | Status: DC

## 2019-04-09 NOTE — Progress Notes (Signed)
Subjective:    Patient ID: Carla Oconnor, female    DOB: 1950/10/26, 68 y.o.   MRN: [id number]  HPI  68 year old Female for health maintenance exam and evaluation of medical issues.  History of hypertension, hyperlipidemia, GE reflux, controlled type 2 diabetes mellitus.  History of obesity.  She is status post hysterectomy with right oophorectomy and right salpingectomy in 2003.  History of left lateral epicondylitis 2013.  Carpal tunnel release right hand October 2013.  Cesarean sections 1975 and 1977.  Benign right breast biopsies in 1966 and 1976.  Right trigger finger release September 2009.  Right third trigger finger treated July 2007.  Patient says she had hepatitis C exposure via her husband but her hepatitis C antibody was negative in December 2017.  Social history: 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  One set of twins a boy and a girl and also another son.  She lost her nephew during the Summer 2019 in a single car accident on [address] during bad weather.  This upset the entire family.  Husband is a English as a second language teacher and a [place] veteran.  Family history: Father died at age 69 with stroke.  Mother with history of diabetes and hypertension.  1 brother in fairly good health but has had trouble with crack addiction.  4 sisters.  She has 2 full sisters and 2 half sisters.  1 sister died of kidney failure with history of breast cancer.  1 sister with history of diabetes and coronary artery disease.  In the Spring 2014 she saw cardiologist regarding chest pain.  She had a negative Cardiolite study.  She has had a previous Cardiolite study in 2009 that was negative.  She had a cardiac cath by Dr. Aleen Campi in 2003.    Review of Systems she has a history of anxiety and is anxious about her health.  Has been anxious and depressed during the pandemic.  Is dealing with this fairly well right now     Objective:   Physical Exam Blood  pressure 128/80, pulse 83, weight 194 pounds BMI 35.48 pulse oximetry 97%.  Skin warm and dry.  Nodes none.  TMs are clear.  Neck is supple without JVD thyromegaly or carotid bruits.  Chest is clear to auscultation.  Breast without masses.  Cardiac exam regular rate and rhythm normal S1 and S2 no murmurs or gallops.  Abdomen no hepatosplenomegaly masses or tenderness.  Bimanual is normal.  Pap deferred due to age.  No lower extremity pitting edema or deformity.  Neuro no focal deficits on brief neurological exam.  Thought judgment and affect are normal.       Assessment & Plan:  History of anxiety treated with Klonopin at bedtime  Essential hypertension-continue Diovan HCTZ  Hyperlipidemia treated with Crestor 20 mg daily  Controlled type 2 diabetes mellitus  Elevated BMI of 35.48-needs to diet exercise and lose weight  History of hypokalemia treated with potassium supplement  History of asthma and wheezing-albuterol inhaler on hand  Plan: Strongly encourage diet exercise and weight loss.  Continue current medications and follow-up in 6 months.  Take Covid vaccine when available.  Subjective:   Patient presents for Medicare Annual/Subsequent preventive examination.  Review Past Medical/Family/Social: See above   Risk Factors  Current exercise habits: Needs to increase exercise Dietary issues discussed: Low-fat low carbohydrate  Cardiac risk factors: Glucose intolerance hyperlipidemia  Depression Screen  (Note: if answer  to either of the following is "Yes", a more complete depression screening is indicated)   Over the past two weeks, have you felt down, depressed or hopeless? No  Over the past two weeks, have you felt little interest or pleasure in doing things? No Have you lost interest or pleasure in daily life? No Do you often feel hopeless? No Do you cry easily over simple problems? No   Activities of Daily Living  In your present state of health, do you have any  difficulty performing the following activities?:   Driving? No  Managing money? No  Feeding yourself? No  Getting from bed to chair? No  Climbing a flight of stairs? No  Preparing food and eating?: No  Bathing or showering? No  Getting dressed: No  Getting to the toilet? No  Using the toilet:No  Moving around from place to place: No  In the past year have you fallen or had a near fall?:No  Are you sexually active? yes Do you have more than one partner? No   Hearing Difficulties: No  Do you often ask people to speak up or repeat themselves? No  Do you experience ringing or noises in your ears? No  Do you have difficulty understanding soft or whispered voices? No  Do you feel that you have a problem with memory? No Do you often misplace items?  Yes misplace my keys   Home Safety:  Do you have a smoke alarm at your residence? Yes Do you have grab bars in the bathroom?  Yes Do you have throw rugs in your house?  Yes   Cognitive Testing  Alert? Yes Normal Appearance?Yes  Oriented to person? Yes Place? Yes  Time? Yes  Recall of three objects? Yes  Can perform simple calculations? Yes  Displays appropriate judgment?Yes  Can read the correct time from a watch face?Yes   List the Names of Other Physician/Practitioners you currently use:  See referral list for the physicians patient is currently seeing.     Review of Systems:   Objective:     General appearance: Appears stated age and mildly obese  Head: Normocephalic, without obvious abnormality, atraumatic  Eyes: conj clear, EOMi PEERLA  Ears: normal TM's and external ear canals both ears  Nose: Nares normal. Septum midline. Mucosa normal. No drainage or sinus tenderness.  Throat: lips, mucosa, and tongue normal; teeth and gums normal  Neck: no adenopathy, no carotid bruit, no JVD, supple, symmetrical, trachea midline and thyroid not enlarged, symmetric, no tenderness/mass/nodules  No CVA tenderness.  Lungs: clear  to auscultation bilaterally  Breasts: normal appearance, no masses or tenderness Heart: regular rate and rhythm, S1, S2 normal, no murmur, click, rub or gallop  Abdomen: soft, non-tender; bowel sounds normal; no masses, no organomegaly  Musculoskeletal: ROM normal in all joints, no crepitus, no deformity, Normal muscle strengthen. Back  is symmetric, no curvature. Skin: Skin color, texture, turgor normal. No rashes or lesions  Lymph nodes: Cervical, supraclavicular, and axillary nodes normal.  Neurologic: CN 2 -12 Normal, Normal symmetric reflexes. Normal coordination and gait  Psych: Alert & Oriented x 3, Mood appear stable.    Assessment:    Annual wellness medicare exam   Plan:    During the course of the visit the patient was educated and counseled about appropriate screening and preventive services including:   Has had flu and pneumococcal vaccines     Patient Instructions (the written plan) was given to the patient.  Medicare Attestation  I have personally reviewed:  The patient's medical and social history  Their use of alcohol, tobacco or illicit drugs  Their current medications and supplements  The patient's functional ability including ADLs,fall risks, home safety risks, cognitive, and hearing and visual impairment  Diet and physical activities  Evidence for depression or mood disorders  The patient's weight, height, BMI, and visual acuity have been recorded in the chart. I have made referrals, counseling, and provided education to the patient based on review of the above and I have provided the patient with a written personalized care plan for preventive services.

## 2019-04-25 ENCOUNTER — Encounter: Payer: Self-pay | Admitting: Internal Medicine

## 2019-04-25 ENCOUNTER — Ambulatory Visit (INDEPENDENT_AMBULATORY_CARE_PROVIDER_SITE_OTHER): Payer: Medicare PPO | Admitting: Internal Medicine

## 2019-04-25 ENCOUNTER — Ambulatory Visit: Payer: Medicare PPO | Attending: Internal Medicine

## 2019-04-25 ENCOUNTER — Other Ambulatory Visit: Payer: Self-pay

## 2019-04-25 ENCOUNTER — Telehealth: Payer: Self-pay | Admitting: Internal Medicine

## 2019-04-25 VITALS — Ht 64.0 in | Wt 194.0 lb

## 2019-04-25 DIAGNOSIS — Z20822 Contact with and (suspected) exposure to covid-19: Secondary | ICD-10-CM

## 2019-04-25 DIAGNOSIS — J22 Unspecified acute lower respiratory infection: Secondary | ICD-10-CM

## 2019-04-25 DIAGNOSIS — R05 Cough: Secondary | ICD-10-CM | POA: Diagnosis not present

## 2019-04-25 DIAGNOSIS — R059 Cough, unspecified: Secondary | ICD-10-CM

## 2019-04-25 MED ORDER — HYDROCODONE-HOMATROPINE 5-1.5 MG/5ML PO SYRP: 5.0000 mL | ORAL_SOLUTION | Freq: Three times a day (TID) | ORAL | 0 refills | Status: DC | PRN

## 2019-04-25 NOTE — Progress Notes (Signed)
   Subjective:    Patient ID: Carla Oconnor, female    DOB: November 16, 1950, 69 y.o.   MRN: [id number]  HPI 69 year old Black Female seen today via interactive audio and video telecommunications due to the Coronavirus pandemic.  She is identified using 2 identifiers as Carla Oconnor, a patient in this practice.  She is agreeable to visit in this format today.  Patient says she is really not been anywhere except the grocery store.  She has come down with a respiratory infection complaining of ear hurting, eyebrows aching and no energy.  Has had a cough.  Has been using her asthma inhaler.  Has been walking in the park.  Says she has had diaphoresis.  She will have COVID-19 testing today.    Review of Systems no nausea vomiting dysgeusia     Objective:   Physical Exam Reports no fever  Looks fatigued  Coughing a bit       Assessment & Plan:  Acute respiratory infection-rule out COVID-19 infection  Plan: Is to use albuterol inhaler 2 sprays 4 times daily.  Hycodan 1 teaspoon p.o. every 6 hours as needed cough.  Rest and drink plenty of fluids.  Addendum: COVID-19 test is negative.  She was not treated with antibiotics.  She reminds me that she cannot tolerate Hycodan and was therefore changed to Tussionex for cough.  Has albuterol inhaler on hand.  Call if symptoms worsen.  Monitor pulse oximetry.

## 2019-04-25 NOTE — Telephone Encounter (Signed)
Please send her to get tested and then we can do virtual visit.

## 2019-04-25 NOTE — Telephone Encounter (Signed)
Carla Oconnor [phone]  [name] called to say yesterday she started wheezing, chills, both ears hurt, sore throat, dry cough. No COVID exposure that she knows of.

## 2019-04-25 NOTE — Telephone Encounter (Signed)
Done

## 2019-04-26 ENCOUNTER — Telehealth: Payer: Self-pay | Admitting: Internal Medicine

## 2019-04-26 DIAGNOSIS — J029 Acute pharyngitis, unspecified: Secondary | ICD-10-CM

## 2019-04-26 DIAGNOSIS — R059 Cough, unspecified: Secondary | ICD-10-CM

## 2019-04-26 DIAGNOSIS — R05 Cough: Secondary | ICD-10-CM

## 2019-04-26 MED ORDER — HYDROCOD POLST-CPM POLST ER 10-8 MG/5ML PO SUER: 5.0000 mL | Freq: Two times a day (BID) | ORAL | 0 refills | Status: DC | PRN

## 2019-04-26 NOTE — Telephone Encounter (Signed)
It looks like she has gotten TUSSIONEX in the past.

## 2019-04-26 NOTE — Telephone Encounter (Signed)
Patient says she cannot tolerate Hycodan due to side effects. Have sent e-scribe for Tussionex. She can take Hycodan back to pharmacy if they will fill Tussionex. Call us Monday with progress report. Waiting on Covid test.

## 2019-04-26 NOTE — Telephone Encounter (Signed)
Carla Oconnor called to say she is feeling little better today,  Still has sore throat. She also stated that the cough medicine was not the one she usually gets, that this one makes her nausea and makes her stomach quiver. She did take some acid reflux medication about an hour after she took medicine and it did help some.The one that does better for her is thicker.

## 2019-04-26 NOTE — Telephone Encounter (Signed)
Patient was notified, tussionex was sent she was told to take the hycodan back to the pharmacy.

## 2019-04-27 LAB — NOVEL CORONAVIRUS, NAA: SARS-CoV-2, NAA: NOT DETECTED

## 2019-04-29 ENCOUNTER — Telehealth: Payer: Self-pay | Admitting: Internal Medicine

## 2019-04-29 ENCOUNTER — Other Ambulatory Visit: Payer: Self-pay

## 2019-04-29 MED ORDER — GLIPIZIDE ER 10 MG PO TB24: ORAL_TABLET | ORAL | 3 refills | Status: DC

## 2019-04-29 NOTE — Telephone Encounter (Signed)
Received Fax RX request from  Pharmacy -  [facility][id number] [name],  - [address] AT [facility] OF [facility] & [address] Phone:  [phone]  Fax:  [phone]       Medication - Glipizide ER 10mg  tablets  Last Refill - 10/30/18  Last OV - 04/25/19  Last CPE - 04/09/19  Next Appointment - 05/16/19

## 2019-05-11 NOTE — Patient Instructions (Signed)
It was a pleasure to see you today.  It is imperative that she weight and exercise.  Continue current medications and follow-up in 6 months.  Take Covid vaccine when available.

## 2019-05-12 NOTE — Patient Instructions (Signed)
Have COVID-19 test.  Hycodan 1 teaspoon p.o. every 6 hours as needed cough.  Rest and drink plenty of fluids.  Use albuterol inhaler.  Monitor pulse oximetry.  Addendum: Patient does not tolerate Hycodan and subsequently we changed her to Tussionex 1 teaspoon p.o. every 12 hours.  Cough.

## 2019-05-16 ENCOUNTER — Other Ambulatory Visit: Payer: Medicare PPO | Admitting: Internal Medicine

## 2019-05-16 ENCOUNTER — Other Ambulatory Visit: Payer: Self-pay

## 2019-05-16 DIAGNOSIS — E785 Hyperlipidemia, unspecified: Secondary | ICD-10-CM | POA: Diagnosis not present

## 2019-05-16 DIAGNOSIS — E1169 Type 2 diabetes mellitus with other specified complication: Secondary | ICD-10-CM | POA: Diagnosis not present

## 2019-05-16 LAB — LIPID PANEL
Cholesterol: 188 mg/dL (ref <200)
HDL: 55 mg/dL (ref >=50)
LDL Cholesterol (Calc): 107 mg/dL (calc) — ABNORMAL HIGH
Non-HDL Cholesterol (Calc): 133 mg/dL (calc) — ABNORMAL HIGH (ref <130)
Total CHOL/HDL Ratio: 3.4 (calc) (ref <5.0)
Triglycerides: 144 mg/dL (ref <150)

## 2019-05-16 LAB — HEPATIC FUNCTION PANEL
AG Ratio: 1.9 (calc) (ref 1.0–2.5)
ALT: 17 U/L (ref 6–29)
AST: 17 U/L (ref 10–35)
Albumin: 4.3 g/dL (ref 3.6–5.1)
Alkaline phosphatase (APISO): 64 U/L (ref 37–153)
Bilirubin, Direct: 0.1 mg/dL (ref 0.0–0.2)
Globulin: 2.3 g/dL (calc) (ref 1.9–3.7)
Indirect Bilirubin: 0.6 mg/dL (calc) (ref 0.2–1.2)
Total Bilirubin: 0.7 mg/dL (ref 0.2–1.2)
Total Protein: 6.6 g/dL (ref 6.1–8.1)

## 2019-05-17 ENCOUNTER — Ambulatory Visit
Admission: RE | Admit: 2019-05-17 | Discharge: 2019-05-17 | Disposition: A | Discharge status: Home / Self Care | Payer: Medicare PPO | Source: Ambulatory Visit | Attending: Internal Medicine | Admitting: Internal Medicine

## 2019-05-17 DIAGNOSIS — Z1231 Encounter for screening mammogram for malignant neoplasm of breast: Secondary | ICD-10-CM | POA: Diagnosis not present

## 2019-05-20 ENCOUNTER — Other Ambulatory Visit: Payer: Self-pay

## 2019-05-20 MED ORDER — IBUPROFEN 800 MG PO TABS: 800.0000 mg | ORAL_TABLET | Freq: Three times a day (TID) | ORAL | 1 refills | Status: DC | PRN

## 2019-05-20 NOTE — Addendum Note (Signed)
Addended by: Gregery Na on: 05/20/2019 05:09 PM   Modules accepted: Orders

## 2019-06-05 ENCOUNTER — Other Ambulatory Visit: Payer: Self-pay

## 2019-06-05 MED ORDER — CLONAZEPAM 0.5 MG PO TABS: 0.5000 mg | ORAL_TABLET | Freq: Every evening | ORAL | 0 refills | Status: DC | PRN

## 2019-06-05 NOTE — Telephone Encounter (Signed)
Patient would like a refill on her KLONOPIN to help her sleep.

## 2019-06-07 ENCOUNTER — Other Ambulatory Visit: Payer: Self-pay | Admitting: Internal Medicine

## 2019-07-09 ENCOUNTER — Telehealth: Payer: Self-pay | Admitting: Internal Medicine

## 2019-07-09 MED ORDER — METFORMIN HCL 1000 MG PO TABS: 1000.0000 mg | ORAL_TABLET | Freq: Two times a day (BID) | ORAL | 5 refills | Status: DC

## 2019-07-09 NOTE — Telephone Encounter (Signed)
Carla Oconnor [phone]  Carla Oconnor called to say the pharmacy said she needs new prescription for medication listed below.  metFORMIN (GLUCOPHAGE) 1000 MG tablet  [facility] #[id number] Carla Oconnor, [place] - [address] AT [facility] OF [facility] & [address] Phone:  [phone]  Fax:  [phone]

## 2019-08-08 ENCOUNTER — Ambulatory Visit: Payer: Medicare PPO | Attending: Internal Medicine

## 2019-08-08 DIAGNOSIS — Z20822 Contact with and (suspected) exposure to covid-19: Secondary | ICD-10-CM | POA: Diagnosis not present

## 2019-08-09 LAB — NOVEL CORONAVIRUS, NAA: SARS-CoV-2, NAA: NOT DETECTED

## 2019-08-09 LAB — SARS-COV-2, NAA 2 DAY TAT

## 2019-08-16 ENCOUNTER — Emergency Department (HOSPITAL_BASED_OUTPATIENT_CLINIC_OR_DEPARTMENT_OTHER)
Admission: EM | Admit: 2019-08-16 | Discharge: 2019-08-16 | Disposition: A | Discharge status: Home / Self Care | Payer: Medicare PPO | Attending: Emergency Medicine | Admitting: Emergency Medicine

## 2019-08-16 ENCOUNTER — Encounter (HOSPITAL_BASED_OUTPATIENT_CLINIC_OR_DEPARTMENT_OTHER): Payer: Self-pay | Admitting: *Deleted

## 2019-08-16 ENCOUNTER — Other Ambulatory Visit: Payer: Self-pay

## 2019-08-16 DIAGNOSIS — Z7984 Long term (current) use of oral hypoglycemic drugs: Secondary | ICD-10-CM | POA: Insufficient documentation

## 2019-08-16 DIAGNOSIS — Z7982 Long term (current) use of aspirin: Secondary | ICD-10-CM | POA: Insufficient documentation

## 2019-08-16 DIAGNOSIS — R197 Diarrhea, unspecified: Secondary | ICD-10-CM | POA: Diagnosis not present

## 2019-08-16 DIAGNOSIS — I10 Essential (primary) hypertension: Secondary | ICD-10-CM | POA: Insufficient documentation

## 2019-08-16 DIAGNOSIS — Z79899 Other long term (current) drug therapy: Secondary | ICD-10-CM | POA: Insufficient documentation

## 2019-08-16 DIAGNOSIS — J45909 Unspecified asthma, uncomplicated: Secondary | ICD-10-CM | POA: Insufficient documentation

## 2019-08-16 DIAGNOSIS — E1165 Type 2 diabetes mellitus with hyperglycemia: Secondary | ICD-10-CM | POA: Diagnosis not present

## 2019-08-16 DIAGNOSIS — R739 Hyperglycemia, unspecified: Secondary | ICD-10-CM

## 2019-08-16 LAB — URINALYSIS, MICROSCOPIC (REFLEX)

## 2019-08-16 LAB — URINALYSIS, ROUTINE W REFLEX MICROSCOPIC
Bilirubin Urine: NEGATIVE
Glucose, UA: >=500 mg/dL — AB
Ketones, ur: NEGATIVE mg/dL
Nitrite: NEGATIVE
Protein, ur: NEGATIVE mg/dL
Specific Gravity, Urine: 1.025 (ref 1.005–1.030)
pH: 5.5 (ref 5.0–8.0)

## 2019-08-16 LAB — CBG MONITORING, ED: Glucose-Capillary: 221 mg/dL — ABNORMAL HIGH (ref 70–99)

## 2019-08-16 LAB — CBC WITH DIFFERENTIAL/PLATELET
Abs Immature Granulocytes: 0.06 10*3/uL (ref 0.00–0.07)
Basophils Absolute: 0 10*3/uL (ref 0.0–0.1)
Basophils Relative: 1 %
Eosinophils Absolute: 0.1 10*3/uL (ref 0.0–0.5)
Eosinophils Relative: 2 %
HCT: 35.5 % — ABNORMAL LOW (ref 36.0–46.0)
Hemoglobin: 12.1 g/dL (ref 12.0–15.0)
Immature Granulocytes: 1 %
Lymphocytes Relative: 44 %
Lymphs Abs: 2.4 10*3/uL (ref 0.7–4.0)
MCH: 30 pg (ref 26.0–34.0)
MCHC: 34.1 g/dL (ref 30.0–36.0)
MCV: 88.1 fL (ref 80.0–100.0)
Monocytes Absolute: 0.5 10*3/uL (ref 0.1–1.0)
Monocytes Relative: 10 %
Neutro Abs: 2.3 10*3/uL (ref 1.7–7.7)
Neutrophils Relative %: 42 %
Platelets: 254 10*3/uL (ref 150–400)
RBC: 4.03 MIL/uL (ref 3.87–5.11)
RDW: 12 % (ref 11.5–15.5)
WBC: 5.5 10*3/uL (ref 4.0–10.5)
nRBC: 0 % (ref 0.0–0.2)

## 2019-08-16 LAB — BASIC METABOLIC PANEL
Anion gap: 12 (ref 5–15)
BUN: 28 mg/dL — ABNORMAL HIGH (ref 8–23)
CO2: 25 mmol/L (ref 22–32)
Calcium: 9.4 mg/dL (ref 8.9–10.3)
Chloride: 101 mmol/L (ref 98–111)
Creatinine, Ser: 0.92 mg/dL (ref 0.44–1.00)
GFR calc Af Amer: >60 mL/min (ref >60)
GFR calc non Af Amer: >60 mL/min (ref >60)
Glucose, Bld: 228 mg/dL — ABNORMAL HIGH (ref 70–99)
Potassium: 3.7 mmol/L (ref 3.5–5.1)
Sodium: 138 mmol/L (ref 135–145)

## 2019-08-16 MED ORDER — ONDANSETRON 4 MG PO TBDP
8.0000 mg | ORAL_TABLET | Freq: Once | ORAL | Status: AC
  Administered 2019-08-16: 20:00:00 8 mg via ORAL
  Filled 2019-08-16: qty 2

## 2019-08-16 MED ORDER — ONDANSETRON HCL 4 MG PO TABS
4.0000 mg | ORAL_TABLET | Freq: Three times a day (TID) | ORAL | 0 refills | Status: DC | PRN
Start: 2019-08-16 — End: 2019-10-10

## 2019-08-16 NOTE — ED Triage Notes (Signed)
Her blood sugar was 250 on her last check 30 minutes ago. States she has diarrhea x 3 days.

## 2019-08-16 NOTE — ED Provider Notes (Signed)
[facility] EMERGENCY DEPARTMENT Provider Note   CSN: [id number] Arrival date & time: 08/16/19  1857     History Chief Complaint  Patient presents with  . Hyperglycemia  . Diarrhea    Carla Oconnor is a 69 y.o. female.  HPI HPI Comments: Carla Oconnor is a 69 y.o. female with history of diabetes mellitus who presents to the Emergency Department complaining of hyperglycemia.  Patient states that 3 days ago she began experiencing watery brown diarrhea.  No hematochezia.  She states it was occurring about 5-6 times a day.  During the last few days she notes that her blood sugars have been fluctuating more than normal.  She states her blood sugar levels are never over 125.  This week she states that they have been in the 220s quite often, her blood sugar was 221 upon arrival today.  She states the highest was in the 350s.  She notes associated diffuse colicky abdominal pain during bouts of diarrhea but denies any currently.  She notes associated polyuria and polydipsia this week as well as intermittent nausea without vomiting.  She states her diarrhea and abdominal pain have alleviated significantly over the last few days.  She takes Metformin and glipizide for diabetes.  She denies fevers, URI symptoms, chest pain, shortness of breath, constipation, dysuria, hematuria, syncope.     Past Medical History:  Diagnosis Date  . Asthma    as child and seasonal  . Diabetes mellitus without complication (HCC)   . Fibrocystic breast   . GERD (gastroesophageal reflux disease)   . Hyperlipidemia   . Hypertension   . MRSA infection 2008   right arm  . Neuromuscular disorder (HCC)    tennis elbow on right and left arm  . S/P hysterectomy     Patient Active Problem List   Diagnosis Date Noted  . S/P right knee arthroscopy 12/12/2018  . Acute medial meniscal tear, right, initial encounter 10/08/2018  . Herpes simplex type II infection 05/06/2017  . Chronic back pain  10/25/2013  . Metabolic syndrome 08/17/2013  . Anxiety state, unspecified 05/28/2012  . Insomnia 05/28/2012  . Hyperlipidemia 01/17/2011  . Hypertension 01/17/2011  . GE reflux 01/17/2011  . Asthma 01/17/2011  . Fibrocystic breast disease 01/17/2011  . Diabetes mellitus (HCC) 01/17/2011    Past Surgical History:  Procedure Laterality Date  . ABDOMINAL HYSTERECTOMY     2003- still has left ovary  . BREAST EXCISIONAL BIOPSY    . BREAST SURGERY     biopsy rt breast-1960- benign.Left breast 1975  - lumpectomy - benign  . CESAREAN SECTION    . COLONOSCOPY    . KNEE ARTHROSCOPY Right 12/06/2018   Procedure: RIGHT KNEE ARTHROSCOPY WITH SYNOVECTOMY/DEBRIDEMENT;  Surgeon: Kathryne Hitch, MD;  Location: [facility];  Service: Orthopedics;  Laterality: Right;  . TRIGGER FINGER RELEASE  02/16/2012   Procedure: RELEASE TRIGGER FINGER/A-1 PULLEY;  Surgeon: Wyn Forster., MD;  Location: [facility];  Service: Orthopedics;  Laterality: Right;  RIGHT RING FINGER      OB History   No obstetric history on file.     Family History  Problem Relation Age of Onset  . Stroke Father   . Breast cancer Sister   . Asthma Other   . Hypertension Other   . Cancer Other   . Diabetes Other   . Hyperlipidemia Other     Social History   Tobacco Use  . Smoking status: Never  Smoker  . Smokeless tobacco: Never Used  Substance Use Topics  . Alcohol use: Yes    Comment: rarely  . Drug use: No    Home Medications Prior to Admission medications   Medication Sig Start Date End Date Taking? Authorizing Provider  albuterol (PROVENTIL HFA;VENTOLIN HFA) 108 (90 Base) MCG/ACT inhaler Inhale 2 puffs into the lungs every 6 (six) hours as needed for wheezing or shortness of breath. 04/06/18   Elby Showers, MD  aspirin 81 MG tablet Take 81 mg by mouth 2 (two) times daily.     [provider]  BAYER CONTOUR TEST test strip USE AS DIRECTED 11/08/13   Elby Showers, MD  chlorpheniramine-HYDROcodone [facility] PENNKINETIC ER) 10-8 MG/5ML SUER Take 5 mLs by mouth every 12 (twelve) hours as needed for cough. 04/26/19   Elby Showers, MD  cholecalciferol (VITAMIN D) 1000 UNITS tablet Take 1,000 Units by mouth daily.     [provider]  clonazePAM (KLONOPIN) 0.5 MG tablet Take 1 tablet (0.5 mg total) by mouth at bedtime as needed. for sleep 06/05/19   Elby Showers, MD  cyclobenzaprine (FLEXERIL) 10 MG tablet Take 1 tablet (10 mg total) by mouth at bedtime. 10/27/17   Elby Showers, MD  Fingerstix Lancets MISC Check glucose 4-5 TIMES a day. DISPENSE BAYER TEST STRIPS AND LANCETS 11/02/18   Elby Showers, MD  glipiZIDE (GLUCOTROL XL) 10 MG 24 hr tablet TAKE 1 TABLET(10 MG) BY MOUTH DAILY WITH BREAKFAST 04/29/19   Elby Showers, MD  glucose blood test strip Check glucose 4-5 a day. DISPENSE BAYER TEST STRIPS AND LANCETS 11/02/18   Elby Showers, MD  HYDROcodone-acetaminophen (NORCO/VICODIN) 5-325 MG tablet Take 1-2 tablets by mouth every 6 (six) hours as needed for moderate pain. 12/06/18 12/06/19  Mcarthur Rossetti, MD  HYDROcodone-homatropine [facility]) 5-1.5 MG/5ML syrup Take 5 mLs by mouth every 8 (eight) hours as needed for cough. 04/25/19   Elby Showers, MD  ibuprofen (ADVIL) 800 MG tablet Take 1 tablet (800 mg total) by mouth every 8 (eight) hours as needed. 05/20/19   Elby Showers, MD  metFORMIN (GLUCOPHAGE) 1000 MG tablet Take 1 tablet (1,000 mg total) by mouth 2 (two) times daily with a meal. 07/09/19   Baxley, Cresenciano Lick, MD  Multiple Vitamin (MULTIVITAMIN WITH MINERALS) TABS tablet Take 1 tablet by mouth daily.    [provider]  omeprazole (PRILOSEC) 20 MG capsule TAKE 1 CAPSULE(20 MG) BY MOUTH DAILY 11/29/18   Elby Showers, MD  potassium chloride SA (KLOR-CON) 20 MEQ tablet TAKE 2 TABLETS BY MOUTH EVERY DAY. 02/27/19   Elby Showers, MD  rosuvastatin (CRESTOR) 10 MG tablet Take 1 tablet (10 mg total) by mouth daily. 04/09/19    Elby Showers, MD  valACYclovir (VALTREX) 500 MG tablet Take 500 mg by mouth 2 (two) times daily as needed (breakouts).     [provider]  valsartan-hydrochlorothiazide (DIOVAN-HCT) 160-25 MG tablet TAKE 1 TABLET BY MOUTH EVERY DAY 06/07/19   Baxley, Cresenciano Lick, MD    Allergies    Tramadol and Percocet [oxycodone-acetaminophen]  Review of Systems   Review of Systems  All other systems reviewed and are negative.  Ten systems reviewed and are negative for acute change, except as noted in the HPI.   Physical Exam Updated Vital Signs BP (!) 175/87   Pulse 95   Temp 98 F (36.7 C) (Oral)   Resp 20   Ht 5'  2" (1.575 m)   Wt 87.5 kg   SpO2 98%   BMI 35.30 kg/m   Physical Exam Vitals and nursing note reviewed.  Constitutional:      General: She is not in acute distress.    Appearance: Normal appearance. She is not ill-appearing, toxic-appearing or diaphoretic.  HENT:     Head: Normocephalic and atraumatic.     Right Ear: External ear normal.     Left Ear: External ear normal.     Nose: Nose normal.     Mouth/Throat:     Mouth: Mucous membranes are moist.     Pharynx: Oropharynx is clear. No oropharyngeal exudate or posterior oropharyngeal erythema.  Eyes:     General: No scleral icterus.       Right eye: No discharge.        Left eye: No discharge.     Extraocular Movements: Extraocular movements intact.     Conjunctiva/sclera: Conjunctivae normal.     Pupils: Pupils are equal, round, and reactive to light.  Cardiovascular:     Rate and Rhythm: Normal rate and regular rhythm.     Pulses: Normal pulses.     Heart sounds: Normal heart sounds. No murmur. No friction rub. No gallop.   Pulmonary:     Effort: Pulmonary effort is normal. No respiratory distress.     Breath sounds: Normal breath sounds. No stridor. No wheezing, rhonchi or rales.  Abdominal:     General: Abdomen is flat.     Palpations: Abdomen is soft.     Tenderness: There is no abdominal tenderness.  There is no guarding or rebound.  Musculoskeletal:        General: Normal range of motion.     Cervical back: Normal range of motion.  Skin:    General: Skin is warm and dry.  Neurological:     General: No focal deficit present.     Mental Status: She is alert and oriented to person, place, and time.  Psychiatric:        Mood and Affect: Mood normal.        Behavior: Behavior normal.    ED Results / Procedures / Treatments   Labs (all labs ordered are listed, but only abnormal results are displayed) Labs Reviewed  CBC WITH DIFFERENTIAL/PLATELET - Abnormal; Notable for the following components:      Result Value   HCT 35.5 (*)    All other components within normal limits  URINALYSIS, ROUTINE W REFLEX MICROSCOPIC - Abnormal; Notable for the following components:   Glucose, UA >=500 (*)    Hgb urine dipstick TRACE (*)    Leukocytes,Ua TRACE (*)    All other components within normal limits  BASIC METABOLIC PANEL - Abnormal; Notable for the following components:   Glucose, Bld 228 (*)    BUN 28 (*)    All other components within normal limits  URINALYSIS, MICROSCOPIC (REFLEX) - Abnormal; Notable for the following components:   Bacteria, UA RARE (*)    All other components within normal limits  CBG MONITORING, ED - Abnormal; Notable for the following components:   Glucose-Capillary 221 (*)    All other components within normal limits  CBG MONITORING, ED   EKG None  Radiology No results found.  Procedures Procedures (including critical care time)  Medications Ordered in ED Medications  ondansetron (ZOFRAN-ODT) disintegrating tablet 8 mg (8 mg Oral Given 08/16/19 1943)   ED Course  I have reviewed the triage vital signs and  the nursing notes.  Pertinent labs & imaging results that were available during my care of the patient were reviewed by me and considered in my medical decision making (see chart for details).    MDM Rules/Calculators/A&P                       7:37 PM patient is a pleasant 69 year old female with complaints of hyperglycemia.  She has been experiencing mostly alleviated watery diarrhea for the last few days.  Her physical exam is reassuring.  I believe her symptoms are secondary to possible viral gastroenteritis this week.  She does complain of polyuria and polydipsia for the last week.  No dysuria.  Will obtain basic labs and UA.  Will reassess.  9:54 PM labs are reassuring.  Patient is hyperglycemic at 228, though she has no ketonuria or anion gap.  Doubt DKA.  Significant glucosuria which is not surprising given her glucose levels.  I offered the patient COVID-19 testing but she notes that she completed the Moderna COVID-19 vaccination in March of this year.  I am going to discharge her with a short course of Zofran for nausea over the weekend.  She understands she is to follow-up with her primary care provider on Monday to discuss this visit as well as her glucose levels.  I recommended she maintain her current regimen.  She understands she can return to the emergency department with any new or worsening symptoms.  She verbalized understanding of the above plan and was amicable to time of discharge.  Her vital signs are stable.  Patient discharged to home/self care.  Condition at discharge: Stable  Note: Portions of this report may have been transcribed using voice recognition software. Every effort was made to ensure accuracy; however, inadvertent computerized transcription errors may be present.    Final Clinical Impression(s) / ED Diagnoses Final diagnoses:  Diarrhea, unspecified type  Hyperglycemia   Rx / DC Orders ED Discharge Orders         Ordered    ondansetron (ZOFRAN) 4 MG tablet  Every 8 hours PRN     08/16/19 2152           Placido Sou, PA-C 08/16/19 2245    Alvira Monday, MD 08/18/19 2231

## 2019-08-16 NOTE — Discharge Instructions (Addendum)
Per discussion, I am prescribing you Zofran which is an antinausea medication.  You can take that as needed for the next couple of days to help prevent nausea.   I would recommend you continue monitoring your blood sugar.  Please follow-up with your primary care provider early next week to discuss this visit as well as your glucose readings.  Please do not hesitate to return to the emergency department with any new or worsening symptoms.  It was a pleasure to meet you.

## 2019-08-16 NOTE — ED Notes (Signed)
ED Provider at bedside. 

## 2019-08-19 ENCOUNTER — Telehealth: Payer: Self-pay | Admitting: Internal Medicine

## 2019-08-19 NOTE — Telephone Encounter (Signed)
Carla Oconnor [id number]  Maryssa called to say on Friday afternoon she was not feeling well so she laid down, when she woke up it was after 5:00, and she was still feeling very tired and nausea, and her blood sugar was 358, so she went to [facility]. They run some labs, her blood sugar was 221 when she got there. They sent her home and told her to follow up with PCP. She stated that all weekend her sugars have been running in the low 200's, she has been tired and nauseas.

## 2019-08-19 NOTE — Telephone Encounter (Signed)
I have reviewed the ED note. It seems to me the medication may not have been absorbed with the diarrhea. Let's give this a couple of days and see if it will improve. Try crackers, soups, toast, jello. stay away from milk and milk products.

## 2019-08-19 NOTE — Telephone Encounter (Signed)
Called patient to let her know what Dr Baxley said, she verbalized understanding. 

## 2019-08-26 ENCOUNTER — Other Ambulatory Visit: Payer: Self-pay

## 2019-08-26 DIAGNOSIS — E119 Type 2 diabetes mellitus without complications: Secondary | ICD-10-CM

## 2019-08-26 MED ORDER — CONTOUR NEXT TEST VI STRP: ORAL_STRIP | 12 refills | Status: AC

## 2019-10-08 ENCOUNTER — Other Ambulatory Visit: Payer: Medicare PPO | Admitting: Internal Medicine

## 2019-10-08 ENCOUNTER — Other Ambulatory Visit: Payer: Self-pay

## 2019-10-08 DIAGNOSIS — E119 Type 2 diabetes mellitus without complications: Secondary | ICD-10-CM

## 2019-10-08 DIAGNOSIS — E1169 Type 2 diabetes mellitus with other specified complication: Secondary | ICD-10-CM | POA: Diagnosis not present

## 2019-10-08 DIAGNOSIS — E785 Hyperlipidemia, unspecified: Secondary | ICD-10-CM

## 2019-10-08 DIAGNOSIS — E079 Disorder of thyroid, unspecified: Secondary | ICD-10-CM | POA: Diagnosis not present

## 2019-10-08 DIAGNOSIS — I10 Essential (primary) hypertension: Secondary | ICD-10-CM | POA: Diagnosis not present

## 2019-10-09 LAB — HEMOGLOBIN A1C
Hgb A1c MFr Bld: 8.1 % of total Hgb — ABNORMAL HIGH (ref <5.7)
Mean Plasma Glucose: 186 (calc)
eAG (mmol/L): 10.3 (calc)

## 2019-10-09 LAB — HEPATIC FUNCTION PANEL
AG Ratio: 1.8 (calc) (ref 1.0–2.5)
ALT: 13 U/L (ref 6–29)
AST: 15 U/L (ref 10–35)
Albumin: 4.3 g/dL (ref 3.6–5.1)
Alkaline phosphatase (APISO): 60 U/L (ref 37–153)
Bilirubin, Direct: 0.2 mg/dL (ref 0.0–0.2)
Globulin: 2.4 g/dL (calc) (ref 1.9–3.7)
Indirect Bilirubin: 0.6 mg/dL (calc) (ref 0.2–1.2)
Total Bilirubin: 0.8 mg/dL (ref 0.2–1.2)
Total Protein: 6.7 g/dL (ref 6.1–8.1)

## 2019-10-09 LAB — LIPID PANEL
Cholesterol: 191 mg/dL (ref <200)
HDL: 56 mg/dL (ref >=50)
LDL Cholesterol (Calc): 110 mg/dL (calc) — ABNORMAL HIGH
Non-HDL Cholesterol (Calc): 135 mg/dL (calc) — ABNORMAL HIGH (ref <130)
Total CHOL/HDL Ratio: 3.4 (calc) (ref <5.0)
Triglycerides: 142 mg/dL (ref <150)

## 2019-10-09 LAB — TSH: TSH: 1.35 mIU/L (ref 0.40–4.50)

## 2019-10-10 ENCOUNTER — Other Ambulatory Visit: Payer: Self-pay

## 2019-10-10 ENCOUNTER — Encounter: Payer: Self-pay | Admitting: Internal Medicine

## 2019-10-10 ENCOUNTER — Ambulatory Visit: Payer: Medicare PPO | Admitting: Internal Medicine

## 2019-10-10 VITALS — BP 130/70 | HR 85 | Ht 64.0 in | Wt 188.0 lb

## 2019-10-10 DIAGNOSIS — Z6834 Body mass index (BMI) 34.0-34.9, adult: Secondary | ICD-10-CM

## 2019-10-10 DIAGNOSIS — L600 Ingrowing nail: Secondary | ICD-10-CM | POA: Diagnosis not present

## 2019-10-10 DIAGNOSIS — I10 Essential (primary) hypertension: Secondary | ICD-10-CM

## 2019-10-10 DIAGNOSIS — E782 Mixed hyperlipidemia: Secondary | ICD-10-CM | POA: Diagnosis not present

## 2019-10-10 DIAGNOSIS — F439 Reaction to severe stress, unspecified: Secondary | ICD-10-CM

## 2019-10-10 DIAGNOSIS — E1169 Type 2 diabetes mellitus with other specified complication: Secondary | ICD-10-CM

## 2019-10-10 MED ORDER — MELOXICAM 15 MG PO TABS
15.0000 mg | ORAL_TABLET | Freq: Every day | ORAL | 0 refills | Status: DC
Start: 2019-10-10 — End: 2020-01-07

## 2019-10-10 NOTE — Progress Notes (Signed)
   Subjective:    Patient ID: Carla Oconnor, female    DOB: December 02, 1950, 69 y.o.   MRN: [id number]  HPI 69 year old Female for 6 month follow up. Has been under a lot of stress. Family issues discussed. Husband had hip replacement. Mother-in-law was visiting. Brother having adjustment and health issues.   A few weeks ago, blood sugar was elevated for 4 days at 260.  Hemoglobin A1c is 8.1% and previously was 6.8% in December 2020.  LDL cholesterol is 110.  Liver functions are normal.  TSH is normal.  Currently on Crestor 10 mg daily.  For hypertension she takes Diovan HCT 160/25 daily.  Currently taking Metformin 1000 mg twice daily.  Is also on Glucotrol XL 10 mg daily.  Takes Klonopin for anxiety.  Takes Prilosec for GE reflux.  Had colonoscopy 2018  Review of Systems: Situational stress discussed at length.  Has developed ingrown toenail left great toe.  May be seen at [facility].     Objective:   Physical Exam  BP 130/70, pulse 85, pulse oximetry 96% weight 188 pounds BMI 32.27- previous weight was 194 pounds in December 2020. Affect thought and judgment are normal.  Skin warm and dry.  Nodes none.  No thyromegaly.  No carotid bruits.  Chest clear to auscultation.  Cardiac exam regular rate and rhythm normal S1 and S2 without murmurs or gallops.     Assessment & Plan:  Type 2 diabetes mellitus-hemoglobin A1c elevated at 8.1%.  Patient is currently on oral medication.  She will watch her diet and try to get more exercise and we will follow-up with this in 3 months.  Seem to have gotten off track with situational stress.  Essential hypertension stable on current regimen  BMI 32.27-has lost 6 pounds since December.  Continue to encourage diet exercise and weight loss  Hyperlipidemia-stable on current regimen.  LDL is 110.  Ingrown toenail left foot-patient is to call [facility]  Situational stress discussed at length-do not think patient needs counseling at the present  time  Plan: Encourage diet exercise and weight loss.  Watch sweets.  Will not change diabetic medication at present time but will follow-up in 3 months.

## 2019-10-10 NOTE — Patient Instructions (Signed)
Get back on track with diet exercise and weight loss regimen.  Call [facility] center for appointment regarding ingrown toenail.  Continue current medications and follow-up in 3 months with regard to elevated hemoglobin A1c of 8.1%.  Continue antihypertensive medication.  Blood pressure is stable.

## 2019-10-28 ENCOUNTER — Telehealth: Payer: Self-pay | Admitting: Internal Medicine

## 2019-10-28 MED ORDER — VALACYCLOVIR HCL 500 MG PO TABS: 500.0000 mg | ORAL_TABLET | Freq: Two times a day (BID) | ORAL | 3 refills | Status: DC

## 2019-10-28 NOTE — Telephone Encounter (Signed)
Send in Valtrex 500 mg twice a day for 5 days #10 with 3 refills

## 2019-10-28 NOTE — Telephone Encounter (Signed)
Carla Oconnor [phone]  [name] called to say she has an outbreak herpes complex and would like a prescription for below medication.  valACYclovir (VALTREX) 500 MG tablet   [id number] [name], [place] - [address] AT [facility] OF [facility] & [address] Phone:  [phone]  Fax:  [phone]

## 2019-11-15 ENCOUNTER — Telehealth: Payer: Self-pay | Admitting: Internal Medicine

## 2019-11-15 DIAGNOSIS — E119 Type 2 diabetes mellitus without complications: Secondary | ICD-10-CM

## 2019-11-15 NOTE — Telephone Encounter (Signed)
I thought we just sent this in. Please check. It was a fax.

## 2019-11-15 NOTE — Telephone Encounter (Signed)
Received Fax RX request from  Pharmacy -  [id number] [name], [place] - [address] AT [facility] OF [facility] & [address] Phone:  [phone]  Fax:  [phone]       Medication -  Microlet Colored Lancets 100  Last Refill -08/13/2019  Last OV - 10/10/2019  Last CPE - 04/09/2019  Next Appointment - 04/09/2020

## 2019-11-19 MED ORDER — FINGERSTIX LANCETS MISC: 11 refills | Status: AC

## 2019-11-19 NOTE — Addendum Note (Signed)
Addended by: Gregery Na on: 11/19/2019 02:41 PM   Modules accepted: Orders

## 2019-12-17 ENCOUNTER — Encounter: Payer: Self-pay | Admitting: Internal Medicine

## 2019-12-17 DIAGNOSIS — E119 Type 2 diabetes mellitus without complications: Secondary | ICD-10-CM | POA: Diagnosis not present

## 2019-12-17 DIAGNOSIS — Z961 Presence of intraocular lens: Secondary | ICD-10-CM | POA: Diagnosis not present

## 2019-12-17 DIAGNOSIS — H524 Presbyopia: Secondary | ICD-10-CM | POA: Diagnosis not present

## 2019-12-17 LAB — HM DIABETES EYE EXAM

## 2019-12-25 ENCOUNTER — Other Ambulatory Visit: Payer: Self-pay | Admitting: Internal Medicine

## 2020-01-02 ENCOUNTER — Other Ambulatory Visit: Payer: Self-pay

## 2020-01-02 ENCOUNTER — Other Ambulatory Visit: Payer: Medicare PPO | Admitting: Internal Medicine

## 2020-01-02 ENCOUNTER — Encounter: Payer: Self-pay | Admitting: Physician Assistant

## 2020-01-02 ENCOUNTER — Ambulatory Visit (INDEPENDENT_AMBULATORY_CARE_PROVIDER_SITE_OTHER): Payer: Medicare PPO | Admitting: Physician Assistant

## 2020-01-02 ENCOUNTER — Ambulatory Visit (INDEPENDENT_AMBULATORY_CARE_PROVIDER_SITE_OTHER): Payer: Medicare PPO

## 2020-01-02 DIAGNOSIS — M25561 Pain in right knee: Secondary | ICD-10-CM

## 2020-01-02 DIAGNOSIS — E782 Mixed hyperlipidemia: Secondary | ICD-10-CM

## 2020-01-02 DIAGNOSIS — E1169 Type 2 diabetes mellitus with other specified complication: Secondary | ICD-10-CM | POA: Diagnosis not present

## 2020-01-02 DIAGNOSIS — M1711 Unilateral primary osteoarthritis, right knee: Secondary | ICD-10-CM

## 2020-01-02 NOTE — Progress Notes (Signed)
Office Visit Note   Patient: Carla Oconnor           Date of Birth: Jun 14, 1950           MRN: [id number] Visit Date: 01/02/2020              Requested by: Carla Mackintosh, MD [address] [place],  [place] [address] PCP: Carla Mackintosh, MD   Assessment & Plan: Visit Diagnoses:  1. Right knee pain, unspecified chronicity   2. Primary osteoarthritis of right knee     Plan: Given the fact the patient did have some minimal arthritic changes involving medial femoral condyle and medial tibial plateau along with minimal patellofemoral chondromalacia would recommend supplemental injection.  Patient would like to avoid cortisone injections as she is diabetic with a hemoglobin A1c of 7.9.  Therefore we will try to obtain a supplemental injection for her right knee have her back when this is available.  Most likely that time we will need to aspirate the knee as she did have effusion of the knee today.  Recommend she work on quad strengthening and exercises are shown.  Follow-Up Instructions: Return for Supplemental injection.   Orders:  Orders Placed This Encounter  Procedures  . XR KNEE 3 VIEW RIGHT   No orders of the defined types were placed in this encounter.     Procedures: No procedures performed   Clinical Data: No additional findings.   Subjective: Chief Complaint  Patient presents with  . Right Knee - Pain    HPI Mrs. [name] is a 69 year old female who comes in today with right knee pain on and off for the past month no known injury.  She has had some swelling and some popping in the knee but no mechanical symptoms.  She underwent a right knee arthroscopy for a small meniscal root tear back in August 2020.  She was also found to have some minimal arthritic changes involving medial apartment patellofemoral compartment at that time.  Review of Systems Negative for fevers or chills.  Objective: Vital Signs: There were no vitals taken for this  visit.  Physical Exam Constitutional:      Appearance: She is not ill-appearing or diaphoretic.  Pulmonary:     Effort: Pulmonary effort is normal.  Neurological:     Mental Status: She is alert and oriented to person, place, and time.  Psychiatric:        Mood and Affect: Mood normal.     Ortho Exam Bilateral knees good range of motion of both knees.  Tenderness along medial joint line of the right knee only.  No abnormal warmth erythema of either knee.  She does have a slight effusion right knee.  No instability valgus varus stressing.  McMurray's is negative bilaterally. Specialty Comments:  No specialty comments available.  Imaging: XR KNEE 3 VIEW RIGHT  Result Date: 01/02/2020 AP lateral views right knee: No acute fractures.  Medial lateral joint line well-maintained.  Patellofemoral joint with some mild patellofemoral changes.  No bony abnormalities otherwise knee is well located.    PMFS History: Patient Active Problem List   Diagnosis Date Noted  . S/P right knee arthroscopy 12/12/2018  . Acute medial meniscal tear, right, initial encounter 10/08/2018  . Herpes simplex type II infection 05/06/2017  . Chronic back pain 10/25/2013  . Metabolic syndrome 08/17/2013  . Anxiety state, unspecified 05/28/2012  . Insomnia 05/28/2012  . Hyperlipidemia 01/17/2011  . Hypertension 01/17/2011  . GE reflux 01/17/2011  .  Asthma 01/17/2011  . Fibrocystic breast disease 01/17/2011  . Diabetes mellitus (HCC) 01/17/2011   Past Medical History:  Diagnosis Date  . Asthma    as child and seasonal  . Diabetes mellitus without complication (HCC)   . Fibrocystic breast   . GERD (gastroesophageal reflux disease)   . Hyperlipidemia   . Hypertension   . MRSA infection 2008   right arm  . Neuromuscular disorder (HCC)    tennis elbow on right and left arm  . S/P hysterectomy     Family History  Problem Relation Age of Onset  . Stroke Father   . Breast cancer Sister   . Asthma  Other   . Hypertension Other   . Cancer Other   . Diabetes Other   . Hyperlipidemia Other     Past Surgical History:  Procedure Laterality Date  . ABDOMINAL HYSTERECTOMY     2003- still has left ovary  . BREAST EXCISIONAL BIOPSY    . BREAST SURGERY     biopsy rt breast-1960- benign.Left breast 1975  - lumpectomy - benign  . CESAREAN SECTION    . COLONOSCOPY    . KNEE ARTHROSCOPY Right 12/06/2018   Procedure: RIGHT KNEE ARTHROSCOPY WITH SYNOVECTOMY/DEBRIDEMENT;  Surgeon: Kathryne Hitch, MD;  Location: [facility];  Service: Orthopedics;  Laterality: Right;  . TRIGGER FINGER RELEASE  02/16/2012   Procedure: RELEASE TRIGGER FINGER/A-1 PULLEY;  Surgeon: Wyn Forster., MD;  Location: [facility];  Service: Orthopedics;  Laterality: Right;  RIGHT RING FINGER    Social History   Occupational History  . Not on file  Tobacco Use  . Smoking status: Never Smoker  . Smokeless tobacco: Never Used  Vaping Use  . Vaping Use: Never used  Substance and Sexual Activity  . Alcohol use: Yes    Comment: rarely  . Drug use: No  . Sexual activity: Not Currently

## 2020-01-03 LAB — HEMOGLOBIN A1C
Hgb A1c MFr Bld: 6.4 % of total Hgb — ABNORMAL HIGH (ref <5.7)
Mean Plasma Glucose: 137 (calc)
eAG (mmol/L): 7.6 (calc)

## 2020-01-06 ENCOUNTER — Telehealth: Payer: Self-pay | Admitting: Radiology

## 2020-01-06 NOTE — Telephone Encounter (Signed)
Right knee supplemental injection 

## 2020-01-07 ENCOUNTER — Encounter: Payer: Self-pay | Admitting: Internal Medicine

## 2020-01-07 ENCOUNTER — Ambulatory Visit: Payer: Medicare PPO | Admitting: Internal Medicine

## 2020-01-07 ENCOUNTER — Other Ambulatory Visit: Payer: Self-pay

## 2020-01-07 VITALS — BP 110/62 | HR 77 | Ht 64.0 in | Wt 188.0 lb

## 2020-01-07 DIAGNOSIS — M1711 Unilateral primary osteoarthritis, right knee: Secondary | ICD-10-CM

## 2020-01-07 DIAGNOSIS — I10 Essential (primary) hypertension: Secondary | ICD-10-CM | POA: Diagnosis not present

## 2020-01-07 DIAGNOSIS — R7309 Other abnormal glucose: Secondary | ICD-10-CM | POA: Diagnosis not present

## 2020-01-07 DIAGNOSIS — E1169 Type 2 diabetes mellitus with other specified complication: Secondary | ICD-10-CM | POA: Diagnosis not present

## 2020-01-07 DIAGNOSIS — E785 Hyperlipidemia, unspecified: Secondary | ICD-10-CM | POA: Diagnosis not present

## 2020-01-07 DIAGNOSIS — Z6832 Body mass index (BMI) 32.0-32.9, adult: Secondary | ICD-10-CM

## 2020-01-07 NOTE — Patient Instructions (Addendum)
I am pleased with your hemoglobin A1c results with recent diet and exercise efforts.  Continue current medications.  Follow-up in December for Medicare wellness visit and health maintenance exam.  Okay to have Synvisc injections per Dr. Magnus Ivan.

## 2020-01-07 NOTE — Progress Notes (Signed)
   Subjective:    Patient ID: Carla Oconnor, female    DOB: 10-02-1950, 69 y.o.   MRN: [id number]  HPI  69 year old Female for follow up on Type 2 diabetes mellitus.In June, hemoglobin A1c had increased to 8.1% and had been 6.8% in December 2020.  Her mother-in-law and her husband replacement surgery.  She was not following a strict diabetic diet.  Her lipid panel in June was stable with an LDL of 110 and TSH was normal.  Now, hemoglobin A1c has improved with diet and exercise to 6.4%.  She is watching her diet.  Dr. Magnus Ivan has recommended Synvisc injections and I think that would be fine.  She has osteoarthritis and swelling of her right knee.  She is asking about some hydroxychloroquine cream for scars on her face.  She will need to see dermatologist regarding that.  Blood pressure is excellent 110/62  Review of Systems no new complaints    Objective:   Physical Exam Blood pressure 110/62 pulse 77 pulse oximetry 97% weight 188 pounds height 5 feet 4 inches BMI 32.27  Reviewed with her hemoglobin A1c results which is now 6.4% and much improved  Her right knee has a small effusion  Chest clear.  Cardiac exam regular rate and rhythm.  No lower extremity edema.  No thyromegaly.     Assessment & Plan:  Essential hypertension-stable on current regimen  Type 2 diabetes mellitus-hemoglobin A1c improved from 8.1% to 6.4% with diet and exercise.  Continue same regimen.  Osteoarthritis right knee-agree with Synvisc injections per Dr. Magnus Ivan  Hyperlipidemia-continue Crestor 10 mg daily.  Lipid panel not checked with this visit  Plan: She has follow-up appointment for annual Medicare wellness visit and health maintenance exam in late December.  Continue to work on diet exercise and weight loss.

## 2020-01-07 NOTE — Telephone Encounter (Signed)
Noted  

## 2020-01-21 ENCOUNTER — Telehealth: Payer: Self-pay | Admitting: Orthopaedic Surgery

## 2020-01-21 ENCOUNTER — Telehealth: Payer: Self-pay

## 2020-01-21 NOTE — Telephone Encounter (Signed)
Talked with patient concerning gel injection.  

## 2020-01-21 NOTE — Telephone Encounter (Signed)
Submitted VOB, Monovisc, right knee. 

## 2020-01-21 NOTE — Telephone Encounter (Signed)
Pt called stating she would like an update on the authorization for her gel injection; pt states its been about 2 weeks  [phone]

## 2020-01-23 ENCOUNTER — Telehealth: Payer: Self-pay

## 2020-01-23 NOTE — Telephone Encounter (Signed)
PA required for Monovisc, right knee. PA currently Pending: In RN review Tracking# [id number]

## 2020-01-24 ENCOUNTER — Telehealth: Payer: Self-pay

## 2020-01-24 NOTE — Telephone Encounter (Signed)
Talked with patient and advised her that PA is currently Pending for gel injection. Patient voiced that she understands. Stated that she will keep appointment for Monday to have fluid drawn off of her knee.

## 2020-01-26 ENCOUNTER — Other Ambulatory Visit: Payer: Self-pay | Admitting: Internal Medicine

## 2020-01-27 ENCOUNTER — Encounter: Payer: Self-pay | Admitting: Orthopaedic Surgery

## 2020-01-27 ENCOUNTER — Ambulatory Visit: Payer: Medicare PPO | Admitting: Orthopaedic Surgery

## 2020-01-27 DIAGNOSIS — M25461 Effusion, right knee: Secondary | ICD-10-CM

## 2020-01-27 DIAGNOSIS — M1711 Unilateral primary osteoarthritis, right knee: Secondary | ICD-10-CM | POA: Diagnosis not present

## 2020-01-27 NOTE — Progress Notes (Signed)
The patient is well-known to me.  She comes in today requesting an aspiration of her right knee due to the osteoarthritis she has her knee and the buildup of fluid.  It is still pending approval for hyaluronic acid for the right knee.  She has had no other acute change in her medical status.  Evaluation of her right knee does show moderate knee joint effusion.  I was able to aspirate about 40 to 50 cc of yellow fluid from the knee.  This did give her immediate relief.  We will work on submitting again for hyaluronic acid for her right knee.  All question concerns were answered and addressed.  We will be in touch.

## 2020-01-29 ENCOUNTER — Telehealth: Payer: Self-pay

## 2020-01-29 NOTE — Telephone Encounter (Signed)
Per Magnus Ivan re-submit for right knee gel injection

## 2020-01-29 NOTE — Telephone Encounter (Signed)
Approved, Monovisc, right knee. [place] Once OOP has been met, patient will be covered at 100%. Co-pay of $20.00 PA required PA approval#[id number] Valid 10/7/201- 07/23/2020  Appt. 02/03/2020 with Dr. Ninfa Linden

## 2020-01-29 NOTE — Telephone Encounter (Signed)
Patient has been approved for gel injection.  Appointment scheduled.

## 2020-02-03 ENCOUNTER — Encounter: Payer: Self-pay | Admitting: Orthopaedic Surgery

## 2020-02-03 ENCOUNTER — Ambulatory Visit: Payer: Medicare PPO | Admitting: Orthopaedic Surgery

## 2020-02-03 DIAGNOSIS — M1711 Unilateral primary osteoarthritis, right knee: Secondary | ICD-10-CM | POA: Diagnosis not present

## 2020-02-03 MED ORDER — HYALURONAN 88 MG/4ML IX SOSY
88.0000 mg | PREFILLED_SYRINGE | INTRA_ARTICULAR | Status: AC | PRN
  Administered 2020-02-03: 88 mg via INTRA_ARTICULAR

## 2020-02-03 NOTE — Progress Notes (Signed)
   Procedure Note  Patient: Carla Oconnor             Date of Birth: Dec 19, 1950           MRN: [id number]             Visit Date: 02/03/2020  Procedures: Visit Diagnoses: No diagnosis found.  Large Joint Inj: R knee on 02/03/2020 2:45 PM Indications: diagnostic evaluation and pain Details: 22 G 1.5 in needle, superolateral approach  Arthrogram: No  Medications: 88 mg Hyaluronan 88 MG/4ML Outcome: tolerated well, no immediate complications Procedure, treatment alternatives, risks and benefits explained, specific risks discussed. Consent was given by the patient. Immediately prior to procedure a time out was called to verify the correct patient, procedure, equipment, support staff and site/side marked as required. Patient was prepped and draped in the usual sterile fashion.    Is here today for hyaluronic acid injection in her right knee with Monovisc to treat the pain from osteoarthritis.  Last week I did see her and aspirated 40 cc of fluid from her knee.  She does feel like there is some fluid is reaccumulated.  Today I was able to aspirate about 15 cc of fluid.  I then placed a Monovisc injection in her right knee without difficulty.  We have done this to treat the pain from her right knee osteoarthritis and given the fact that she is failed other conservative treatment measures.  All questions and concerns were answered and addressed.  Follow-up can be as needed.  She knows to wait at least 6 months between these types of gel injections.

## 2020-02-05 ENCOUNTER — Telehealth: Payer: Self-pay | Admitting: Internal Medicine

## 2020-02-05 NOTE — Telephone Encounter (Signed)
LVM to CB would like to move CPE to morning appointment

## 2020-02-06 NOTE — Telephone Encounter (Signed)
rescheduled

## 2020-03-04 ENCOUNTER — Ambulatory Visit: Payer: Medicare PPO | Admitting: Physician Assistant

## 2020-03-04 ENCOUNTER — Encounter: Payer: Self-pay | Admitting: Physician Assistant

## 2020-03-04 DIAGNOSIS — M1711 Unilateral primary osteoarthritis, right knee: Secondary | ICD-10-CM

## 2020-03-04 NOTE — Progress Notes (Signed)
HPI Carla Oconnor returns today due to right knee pain and swelling status post Monovisc injection on 02/03/2020.  She states that initially after the injection she had swelling and increased pain in the knee this is gone away.  She also notes that the knee was hot to touch.  She has had no fevers no chills.  Knee pain overall is much improved since the injection and is actually feeling better than it felt prior to the injection.  She is taking Tylenol and ibuprofen for the pain.  Review of systems: See HPI  Physical exam: Right knee full extension full flexion.  Slight effusion no abnormal warmth no erythema no instability valgus varus stressing.  Impression: Right knee arthritis  Plan: Would not recommend aspiration at this time due to the fact that she has good range of motion in the knee is actually feeling better than it has been in some time.  However if the effusion becomes such that the knee is tight and she has decreased range of motion we can definitely aspirated.  In regards to repeat supplemental injection and told her we need to wait at least 6 months from the time injection.  Also discussed with her that just because she had some increased pain and swellin would not rule out getting a supplemental injection in the future.  She may consider steroid injection if she continues to have recurrent effusions.

## 2020-03-10 ENCOUNTER — Other Ambulatory Visit: Payer: Self-pay | Admitting: Internal Medicine

## 2020-03-23 ENCOUNTER — Encounter: Payer: Self-pay | Admitting: Physician Assistant

## 2020-03-23 ENCOUNTER — Other Ambulatory Visit: Payer: Self-pay

## 2020-03-23 ENCOUNTER — Ambulatory Visit (INDEPENDENT_AMBULATORY_CARE_PROVIDER_SITE_OTHER): Payer: Medicare PPO | Admitting: Physician Assistant

## 2020-03-23 DIAGNOSIS — M1711 Unilateral primary osteoarthritis, right knee: Secondary | ICD-10-CM | POA: Diagnosis not present

## 2020-03-23 MED ORDER — METHYLPREDNISOLONE ACETATE 40 MG/ML IJ SUSP
40.0000 mg | INTRAMUSCULAR | Status: AC | PRN
  Administered 2020-03-23: 40 mg via INTRA_ARTICULAR

## 2020-03-23 MED ORDER — LIDOCAINE HCL 1 % IJ SOLN
5.0000 mL | INTRAMUSCULAR | Status: AC | PRN
  Administered 2020-03-23: 5 mL

## 2020-03-23 NOTE — Progress Notes (Signed)
   Procedure Note  Patient: Carla Oconnor             Date of Birth: [birth year]           MRN: [id number]             Visit Date: 03/23/2020 HPI Carla Oconnor comes in today due to right knee pain.  She is having swelling in the knee.  Tightness.  She is concerned about a blood clot in her leg she has had no history of blood clot at night shortness of breath.  She is wanting ultrasound due to the swelling that gets worse at the end of the day.  Patient underwent a supplemental injection in her knee 02/03/2020 continues to have pain in the knee.  She is diabetic last hemoglobin A1c was 6.4 2 months ago.  Physical exam: Right knee no abnormal warmth erythema.  Positive effusion.  Overall good range of motion of the knee.  Patellofemoral crepitus noted.  No instability valgus varus stressing.  Right calf supple minimal tenderness maximal tenderness noted in the popliteal region.   Procedures: Visit Diagnoses:  1. Primary osteoarthritis of right knee     Large Joint Inj: R knee on 03/23/2020 4:59 PM Indications: pain Details: 22 G 1.5 in needle, superolateral approach  Arthrogram: No  Medications: 40 mg methylPREDNISolone acetate 40 MG/ML; 5 mL lidocaine 1 % Aspirate: 40 mL yellow Outcome: tolerated well, no immediate complications Procedure, treatment alternatives, risks and benefits explained, specific risks discussed. Consent was given by the patient. Immediately prior to procedure a time out was called to verify the correct patient, procedure, equipment, support staff and site/side marked as required. Patient was prepped and draped in the usual sterile fashion.    Plan: We will obtain a Doppler of her right lower leg to rule out DVT.  We will call her with the results of those once they are available.  Questions were encouraged and answered at length.

## 2020-03-24 NOTE — Addendum Note (Signed)
Addended by: Barbette Or on: 03/24/2020 10:07 AM   Modules accepted: Orders

## 2020-03-30 ENCOUNTER — Ambulatory Visit (HOSPITAL_COMMUNITY)
Admission: RE | Admit: 2020-03-30 | Discharge: 2020-03-30 | Disposition: A | Discharge status: Home / Self Care | Payer: Medicare PPO | Source: Ambulatory Visit | Attending: Physician Assistant | Admitting: Physician Assistant

## 2020-03-30 ENCOUNTER — Other Ambulatory Visit: Payer: Self-pay

## 2020-03-30 DIAGNOSIS — M1711 Unilateral primary osteoarthritis, right knee: Secondary | ICD-10-CM

## 2020-03-30 DIAGNOSIS — M79604 Pain in right leg: Secondary | ICD-10-CM | POA: Diagnosis not present

## 2020-04-06 ENCOUNTER — Ambulatory Visit: Payer: Medicare PPO | Admitting: Physician Assistant

## 2020-04-06 ENCOUNTER — Encounter: Payer: Self-pay | Admitting: Physician Assistant

## 2020-04-06 DIAGNOSIS — M1711 Unilateral primary osteoarthritis, right knee: Secondary | ICD-10-CM | POA: Diagnosis not present

## 2020-04-06 NOTE — Progress Notes (Signed)
HPI: [name] returns today follow-up of her right knee and right lower leg swelling.  She states the swelling has greatly improved.  She does feel the cortisone injection helped.  Dr. Rennis Petty the right lower extremity was negative for DVT.   See HPI otherwise negative  Physical exam: Right knee excellent range of motion without pain.  Positive patellofemoral crepitus with range of motion.  Right lower leg decreased edema nontender right calf.  Impression: Right knee patellofemoral arthritis Right lower extremity edema  Plan: Discussed with her recommend waiting at least 3 months between injections.  She will continue work on Dance movement psychotherapist.  Knee friendly exercises discussed with her.  Follow-up as needed

## 2020-04-07 ENCOUNTER — Other Ambulatory Visit: Payer: Self-pay

## 2020-04-07 ENCOUNTER — Other Ambulatory Visit: Payer: Medicare PPO | Admitting: Internal Medicine

## 2020-04-07 DIAGNOSIS — Z0000 Encounter for general adult medical examination without abnormal findings: Secondary | ICD-10-CM | POA: Diagnosis not present

## 2020-04-07 DIAGNOSIS — R7309 Other abnormal glucose: Secondary | ICD-10-CM | POA: Diagnosis not present

## 2020-04-07 DIAGNOSIS — E785 Hyperlipidemia, unspecified: Secondary | ICD-10-CM | POA: Diagnosis not present

## 2020-04-07 DIAGNOSIS — E782 Mixed hyperlipidemia: Secondary | ICD-10-CM

## 2020-04-07 DIAGNOSIS — M1711 Unilateral primary osteoarthritis, right knee: Secondary | ICD-10-CM | POA: Diagnosis not present

## 2020-04-07 DIAGNOSIS — E7800 Pure hypercholesterolemia, unspecified: Secondary | ICD-10-CM | POA: Diagnosis not present

## 2020-04-07 DIAGNOSIS — D649 Anemia, unspecified: Secondary | ICD-10-CM | POA: Diagnosis not present

## 2020-04-07 DIAGNOSIS — E1169 Type 2 diabetes mellitus with other specified complication: Secondary | ICD-10-CM

## 2020-04-07 DIAGNOSIS — I10 Essential (primary) hypertension: Secondary | ICD-10-CM | POA: Diagnosis not present

## 2020-04-07 DIAGNOSIS — E781 Pure hyperglyceridemia: Secondary | ICD-10-CM | POA: Diagnosis not present

## 2020-04-07 DIAGNOSIS — E119 Type 2 diabetes mellitus without complications: Secondary | ICD-10-CM

## 2020-04-08 ENCOUNTER — Other Ambulatory Visit: Payer: Self-pay

## 2020-04-08 DIAGNOSIS — D649 Anemia, unspecified: Secondary | ICD-10-CM

## 2020-04-09 ENCOUNTER — Encounter: Payer: Self-pay | Admitting: Internal Medicine

## 2020-04-09 ENCOUNTER — Ambulatory Visit (INDEPENDENT_AMBULATORY_CARE_PROVIDER_SITE_OTHER): Payer: Medicare PPO | Admitting: Internal Medicine

## 2020-04-09 ENCOUNTER — Encounter: Payer: Medicare PPO | Admitting: Internal Medicine

## 2020-04-09 ENCOUNTER — Other Ambulatory Visit: Payer: Self-pay

## 2020-04-09 VITALS — BP 138/70 | HR 90 | Ht 64.0 in | Wt 185.0 lb

## 2020-04-09 DIAGNOSIS — E1169 Type 2 diabetes mellitus with other specified complication: Secondary | ICD-10-CM

## 2020-04-09 DIAGNOSIS — Z0000 Encounter for general adult medical examination without abnormal findings: Secondary | ICD-10-CM

## 2020-04-09 DIAGNOSIS — Z87898 Personal history of other specified conditions: Secondary | ICD-10-CM | POA: Diagnosis not present

## 2020-04-09 DIAGNOSIS — M1711 Unilateral primary osteoarthritis, right knee: Secondary | ICD-10-CM | POA: Diagnosis not present

## 2020-04-09 DIAGNOSIS — E785 Hyperlipidemia, unspecified: Secondary | ICD-10-CM

## 2020-04-09 DIAGNOSIS — F411 Generalized anxiety disorder: Secondary | ICD-10-CM

## 2020-04-09 DIAGNOSIS — Z6831 Body mass index (BMI) 31.0-31.9, adult: Secondary | ICD-10-CM | POA: Diagnosis not present

## 2020-04-09 DIAGNOSIS — Z1231 Encounter for screening mammogram for malignant neoplasm of breast: Secondary | ICD-10-CM

## 2020-04-09 DIAGNOSIS — I10 Essential (primary) hypertension: Secondary | ICD-10-CM | POA: Diagnosis not present

## 2020-04-09 DIAGNOSIS — Z23 Encounter for immunization: Secondary | ICD-10-CM

## 2020-04-09 LAB — CBC WITH DIFFERENTIAL/PLATELET
Absolute Monocytes: 432 cells/uL (ref 200–950)
Basophils Absolute: 48 cells/uL (ref 0–200)
Basophils Relative: 1 %
Eosinophils Absolute: 149 cells/uL (ref 15–500)
Eosinophils Relative: 3.1 %
HCT: 32.8 % — ABNORMAL LOW (ref 35.0–45.0)
Hemoglobin: 11.1 g/dL — ABNORMAL LOW (ref 11.7–15.5)
Lymphs Abs: 1949 cells/uL (ref 850–3900)
MCH: 30.7 pg (ref 27.0–33.0)
MCHC: 33.8 g/dL (ref 32.0–36.0)
MCV: 90.6 fL (ref 80.0–100.0)
MPV: 10.3 fL (ref 7.5–12.5)
Monocytes Relative: 9 %
Neutro Abs: 2222 cells/uL (ref 1500–7800)
Neutrophils Relative %: 46.3 %
Platelets: 265 10*3/uL (ref 140–400)
RBC: 3.62 10*6/uL — ABNORMAL LOW (ref 3.80–5.10)
RDW: 12.6 % (ref 11.0–15.0)
Total Lymphocyte: 40.6 %
WBC: 4.8 10*3/uL (ref 3.8–10.8)

## 2020-04-09 LAB — COMPLETE METABOLIC PANEL WITH GFR
AG Ratio: 1.9 (calc) (ref 1.0–2.5)
ALT: 12 U/L (ref 6–29)
AST: 16 U/L (ref 10–35)
Albumin: 4.3 g/dL (ref 3.6–5.1)
Alkaline phosphatase (APISO): 59 U/L (ref 37–153)
BUN: 18 mg/dL (ref 7–25)
CO2: 31 mmol/L (ref 20–32)
Calcium: 9.2 mg/dL (ref 8.6–10.4)
Chloride: 102 mmol/L (ref 98–110)
Creat: 0.91 mg/dL (ref 0.60–0.93)
GFR, Est African American: 74 mL/min/{1.73_m2} (ref >=60)
GFR, Est Non African American: 64 mL/min/{1.73_m2} (ref >=60)
Globulin: 2.3 g/dL (calc) (ref 1.9–3.7)
Glucose, Bld: 117 mg/dL — ABNORMAL HIGH (ref 65–99)
Potassium: 4.1 mmol/L (ref 3.5–5.3)
Sodium: 142 mmol/L (ref 135–146)
Total Bilirubin: 0.5 mg/dL (ref 0.2–1.2)
Total Protein: 6.6 g/dL (ref 6.1–8.1)

## 2020-04-09 LAB — TEST AUTHORIZATION

## 2020-04-09 LAB — LIPID PANEL
Cholesterol: 185 mg/dL (ref <200)
HDL: 56 mg/dL (ref >=50)
LDL Cholesterol (Calc): 103 mg/dL (calc) — ABNORMAL HIGH
Non-HDL Cholesterol (Calc): 129 mg/dL (calc) (ref <130)
Total CHOL/HDL Ratio: 3.3 (calc) (ref <5.0)
Triglycerides: 159 mg/dL — ABNORMAL HIGH (ref <150)

## 2020-04-09 LAB — POCT URINALYSIS DIPSTICK
Appearance: NEGATIVE
Blood, UA: NEGATIVE
Glucose, UA: NEGATIVE
Ketones, UA: NEGATIVE
Leukocytes, UA: NEGATIVE
Nitrite, UA: NEGATIVE
Odor: NEGATIVE
Protein, UA: NEGATIVE
Spec Grav, UA: 1.025 (ref 1.010–1.025)
Urobilinogen, UA: 0.2 E.U./dL
pH, UA: 6 (ref 5.0–8.0)

## 2020-04-09 LAB — TSH: TSH: 0.9 mIU/L (ref 0.40–4.50)

## 2020-04-09 LAB — VITAMIN B12: Vitamin B-12: 1474 pg/mL — ABNORMAL HIGH (ref 200–1100)

## 2020-04-09 LAB — HEMOGLOBIN A1C
Hgb A1c MFr Bld: 6.5 % of total Hgb — ABNORMAL HIGH (ref <5.7)
Mean Plasma Glucose: 140 mg/dL
eAG (mmol/L): 7.7 mmol/L

## 2020-04-09 LAB — IRON,TIBC AND FERRITIN PANEL
%SAT: 23 % (calc) (ref 16–45)
Ferritin: 80 ng/mL (ref 16–288)
Iron: 58 ug/dL (ref 45–160)
TIBC: 251 mcg/dL (calc) (ref 250–450)

## 2020-04-09 LAB — FOLATE: Folate: 7.9 ng/mL

## 2020-04-09 NOTE — Progress Notes (Signed)
Subjective:    Patient ID: Carla Oconnor, female    DOB: 1950-11-07, 69 y.o.   MRN: [id number]  HPI 69 year old Female for Medicare wellness, health maintenance exam and evaluation of medical issues.  Has been diagnosed with osteoarthritis of right knee.  Says no new replacement is needed at this time.  Has had fluid drawn from right knee about 3 times.  Says there was no DVT.  Has been seeing Dr. Magnus Ivan.  She has a history of hypertension, hyperlipidemia, GE reflux, controlled type 2 diabetes mellitus, obesity.  Status post hysterectomy with right oophorectomy and right salpingectomy in 2003.  History of left lateral epicondylitis 2013.  Carpal tunnel release right hand October 2013.  Cesarean sections 1975 in 1977.  Benign right breast biopsies in 1966 in 1976.  Right trigger finger release September 2009.  Right third trigger finger treated July 2007.  Patient says she had Hepatitis C exposure through her husband but her Hepatitis C antibody was negative in December 2017.  Social history: 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  She has one set of twins, boy and girl and also another son.  She lost her nephew during the summer 2019 and a single car accident [address].  Bad weather.  Her husband is a English as a second language teacher and a [place] veteran.  In the spring 2014 she saw cardiologist regarding chest pain.  She had a negative Cardiolite study.  She had previous Cardiolite study in 2009 that was negative.  She had a cardiac cath with Dr. Aleen Campi in 2003.  Family history: Father died at age 50 with stroke.  Mother with history of diabetes and hypertension.  1 brother in fairly good health but has had trouble with crack addiction.  4 sisters.  She has 2 full sisters and 2 half sisters.  1 sister died of kidney failure with history of breast cancer.  1 sister with history of diabetes and coronary disease.    Review of Systems  Colonoscopy in 2018 with 10 year follow up. To have flu vaccine today.  Anxiety about her health.  Has been anxious and depressed during the pandemic.     Objective:   Physical Exam Blood pressure 138/70, pulse 90 regular pulse oximetry 97% weight 185 pounds height 5 feet 4 inches BMI 31.76 Skin warm and dry.  Nodes none.  TMs clear.  Neck is supple.  No JVD thyromegaly or carotid bruits.  Chest is clear to auscultation.  Breast without masses.  Cardiac exam: Regular rate and rhythm, normal S1 and S2 without murmurs or gallops.  Abdomen: No hepatosplenomegaly masses or tenderness.  Bimanual exam is normal.  Pap deferred due to age.  No lower extremity pitting edema.  Neuro: Intact without focal deficits on brief neurological exam.  Thought, judgment, and affect are normal.      Assessment & Plan:  History of anxiety treated with Klonopin at bedtime  Essential hypertension stable on current regimen of Diovan HCTZ  Hyperlipidemia-treated with Crestor 20 mg daily.  Triglycerides slightly elevated at 159 and LDL cholesterol slightly elevated at 103.  BMI: 31.76.  Previously was 35.48 in December 2020.  Congratulated on weight loss.  History of hypokalemia treated with potassium supplementation.  Type 2 diabetes mellitus.  Hemoglobin A1c 6.5%.  Continue Metformin 1000 mg twice daily.  Continue glipizide XL 10 mg daily  History of asthma.  Has albuterol inhaler on hand.  GE reflux treated with Prilosec  Plan: Continue to encourage diet exercise and weight loss.  Continue current medications.  Has had 3 COVID-19 immunizations.  Tetanus immunization is up-to-date.  Pneumococcal vaccines up-to-date.  Return in June for 8-month follow-up.  Subjective:   Patient presents for Medicare Annual/Subsequent preventive examination.  Review Past Medical/Family/Social: See above  Risk Factors  Current exercise habits: Light exercise Dietary issues discussed: Low-fat low carbohydrate  Cardiac risk  factors: Hyperlipidemia and diabetes mellitus  Depression Screen  (Note: if answer to either of the following is "Yes", a more complete depression screening is indicated)   Over the past two weeks, have you felt down, depressed or hopeless? No  Over the past two weeks, have you felt little interest or pleasure in doing things? No Have you lost interest or pleasure in daily life? No Do you often feel hopeless? No Do you cry easily over simple problems? No   Activities of Daily Living  In your present state of health, do you have any difficulty performing the following activities?:   Driving? No  Managing money? No  Feeding yourself? No  Getting from bed to chair? No  Climbing a flight of stairs?  Yes due to knee issue Preparing food and eating?: No  Bathing or showering? No  Getting dressed: No  Getting to the toilet? No  Using the toilet:No  Moving around from place to place: No  In the past year have you fallen or had a near fall?:No  Are you sexually active?  Yes  do you have more than one partner? No   Hearing Difficulties: No  Do you often ask people to speak up or repeat themselves? No  Do you experience ringing or noises in your ears? No  Do you have difficulty understanding soft or whispered voices? No  Do you feel that you have a problem with memory? No Do you often misplace items?  Sometimes  Home Safety:  Do you have a smoke alarm at your residence? Yes Do you have grab bars in the bathroom?  Yes Do you have throw rugs in your house?  Is   Cognitive Testing  Alert? Yes Normal Appearance?Yes  Oriented to person? Yes Place? Yes  Time? Yes  Recall of three objects? Yes  Can perform simple calculations? Yes  Displays appropriate judgment?Yes  Can read the correct time from a watch face?Yes   List the Names of Other Physician/Practitioners you currently use:  See referral list for the physicians patient is currently seeing.  Dr. Magnus Ivan   Review of  Systems: See above   Objective:     General appearance: Appears stated age and mildly obese  Head: Normocephalic, without obvious abnormality, atraumatic  Eyes: conj clear, EOMi PEERLA  Ears: normal TM's and external ear canals both ears  Nose: Nares normal. Septum midline. Mucosa normal. No drainage or sinus tenderness.  Throat: lips, mucosa, and tongue normal; teeth and gums normal  Neck: no adenopathy, no carotid bruit, no JVD, supple, symmetrical, trachea midline and thyroid not enlarged, symmetric, no tenderness/mass/nodules  No CVA tenderness.  Lungs: clear to auscultation bilaterally  Breasts: normal appearance without masses Heart: regular rate and rhythm, S1, S2 normal, no murmur, click, rub or gallop  Abdomen: soft, non-tender; bowel sounds normal; no masses, no organomegaly  Musculoskeletal: ROM normal in all joints, no crepitus, no deformity, Normal muscle strengthen. Back  is symmetric, no curvature. Skin: Skin color, texture, turgor normal. No rashes or lesions  Lymph nodes:  Cervical, supraclavicular, and axillary nodes normal.  Neurologic: CN 2 -12 Normal, Normal symmetric reflexes. Normal coordination and gait  Psych: Alert & Oriented x 3, Mood appear stable.    Assessment:    Annual wellness medicare exam   Plan:    During the course of the visit the patient was educated and counseled about appropriate screening and preventive services including:  Flu vaccine  Has had COVID vaccines x3      Patient Instructions (the written plan) was given to the patient.  Medicare Attestation  I have personally reviewed:  The patient's medical and social history  Their use of alcohol, tobacco or illicit drugs  Their current medications and supplements  The patient's functional ability including ADLs,fall risks, home safety risks, cognitive, and hearing and visual impairment  Diet and physical activities  Evidence for depression or mood disorders  The patient's  weight, height, BMI, and visual acuity have been recorded in the chart. I have made referrals, counseling, and provided education to the patient based on review of the above and I have provided the patient with a written personalized care plan for preventive services.

## 2020-04-21 NOTE — Progress Notes (Signed)
Date:  04/21/2020   ID:  Carla Oconnor, DOB Nov 17, 1950, MRN [id number]  PCP:  [name], MD  Cardiologist:  Charlton Haws, MD  Electrophysiologist:  None   Evaluation Performed:  Follow-Up Visit  Chief Complaint:  HTN   History of Present Illness:    Carla Oconnor is a 70 y.o. female with CRF;s include HTN, HLD and DM-2.  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 Has a son that  Runs juice/coffee shop at [employer] Husband retired from [employer]  Dr Lenord Fellers follows her closely    The patient does not have symptoms concerning for COVID-19 infection (fever, chills, cough, or new shortness of breath).    Past Medical History:  Diagnosis Date  . Asthma    as child and seasonal  . Diabetes mellitus without complication (HCC)   . Fibrocystic breast   . GERD (gastroesophageal reflux disease)   . Hyperlipidemia   . Hypertension   . MRSA infection 2008   right arm  . Neuromuscular disorder (HCC)    tennis elbow on right and left arm  . S/P hysterectomy    Past Surgical History:  Procedure Laterality Date  . ABDOMINAL HYSTERECTOMY     2003- still has left ovary  . BREAST EXCISIONAL BIOPSY    . BREAST SURGERY     biopsy rt breast-1960- benign.Left breast 1975  - lumpectomy - benign  . CESAREAN SECTION    . COLONOSCOPY    . KNEE ARTHROSCOPY Right 12/06/2018   Procedure: RIGHT KNEE ARTHROSCOPY WITH SYNOVECTOMY/DEBRIDEMENT;  Surgeon: Kathryne Hitch, MD;  Location: [facility];  Service: Orthopedics;  Laterality: Right;  . TRIGGER FINGER RELEASE  02/16/2012   Procedure: RELEASE TRIGGER FINGER/A-1 PULLEY;  Surgeon: Wyn Forster., MD;  Location: [facility];   Service: Orthopedics;  Laterality: Right;  RIGHT RING FINGER      No outpatient medications have been marked as taking for the 04/28/20 encounter (Appointment) with Wendall Stade, MD.     Allergies:   Tramadol and Percocet [oxycodone-acetaminophen]   Social History   Tobacco Use  . Smoking status: Never Smoker  . Smokeless tobacco: Never Used  Vaping Use  . Vaping Use: Never used  Substance Use Topics  . Alcohol use: Yes    Comment: rarely  . Drug use: No     Family Hx: The patient's family history includes Asthma in an other family member; Breast cancer in her sister; Cancer in an other family member; Diabetes in an other family member; Hyperlipidemia in an other family member; Hypertension in an other family member; Stroke in her father.  ROS:   Please see the history of present illness.    General:no colds or fevers, no weight changes Skin:no rashes or ulcers HEENT:no blurred vision, no congestion CV:see HPI  No palpitations  PUL:see HPI GI:no diarrhea constipation or melena, no indigestion GU:no hematuria, no dysuria MS:no joint pain, no claudication Neuro:no syncope, no lightheadedness Endo:no diabetes, no thyroid disease  All other systems reviewed and are negative.   Prior CV studies:   The following studies were reviewed today:  Cardiac CT 06/22/17 Coronary arteries: Very mild calcium noted in proximal RCA/Circumflex  and distal LAD  IMPRESSION: Coronary calcium score of 3. This was 83 th percentile for age and sex matched control.   Labs/Other Tests and Data Reviewed:    EKG:   12/03/18 SR rate 70 nonspecific ST changes 04/28/2020 SR rate 77 nonspecific ST changes   Recent Labs: 04/07/2020: ALT 12; BUN 18; Creat 0.91; Hemoglobin 11.1; Platelets 265; Potassium 4.1; Sodium 142; TSH 0.90   Recent Lipid Panel Lab Results  Component Value Date/Time   CHOL 185 04/07/2020 11:04 AM   TRIG 159 (H) 04/07/2020 11:04 AM   HDL 56 04/07/2020 11:04 AM    CHOLHDL 3.3 04/07/2020 11:04 AM   LDLCALC 103 (H) 04/07/2020 11:04 AM    Wt Readings from Last 3 Encounters:  04/09/20 83.9 kg  01/07/20 85.3 kg  10/10/19 85.3 kg     Objective:    Vital Signs:  There were no vitals taken for this visit.   Affect appropriate Healthy:  appears stated age HEENT: normal Neck supple with no adenopathy JVP normal no bruits no thyromegaly Lungs clear with no wheezing and good diaphragmatic motion Heart:  S1/S2 no murmur, no rub, gallop or click PMI normal Abdomen: benighn, BS positve, no tenderness, no AAA no bruit.  No HSM or HJR Distal pulses intact with no bruits No edema Neuro non-focal Skin warm and dry No muscular weakness    ASSESSMENT & PLAN:    1. HTN Well controlled.  Continue current medications and low sodium Dash type diet.   2. HLD followed by Dr Lenord Fellers LDL 103 04/07/20 on crestor  3. Chest pain atypical previously normal cath and nuclear study calcium score low for age in 2019  observe 4. Ortho:  Right knee pain post injection f/u Magnus Ivan   COVID-19 Education: The signs and symptoms of COVID-19 were discussed with the patient and how to seek care for testing (follow up with PCP or arrange E-visit).  The importance of social distancing was discussed today.  Medication Adjustments/Labs and Tests Ordered: Current medicines are reviewed at length with the patient today.  Concerns regarding medicines are outlined above.   Tests Ordered: No orders of the defined types were placed in this encounter.   Medication Changes: No orders of the defined types were placed in this encounter.   Disposition:  Follow up in 1 year(s)  Signed, Charlton Haws, MD  04/21/2020 10:41 AM    [facility]

## 2020-04-23 ENCOUNTER — Other Ambulatory Visit: Payer: Self-pay | Admitting: Internal Medicine

## 2020-04-28 ENCOUNTER — Ambulatory Visit: Payer: Medicare PPO | Admitting: Cardiovascular Disease

## 2020-04-28 ENCOUNTER — Encounter: Payer: Self-pay | Admitting: Cardiovascular Disease

## 2020-04-28 ENCOUNTER — Other Ambulatory Visit: Payer: Self-pay

## 2020-04-28 ENCOUNTER — Telehealth: Payer: Self-pay | Admitting: Internal Medicine

## 2020-04-28 ENCOUNTER — Encounter: Payer: Self-pay | Admitting: Internal Medicine

## 2020-04-28 ENCOUNTER — Ambulatory Visit: Payer: Medicare PPO | Admitting: Internal Medicine

## 2020-04-28 VITALS — HR 88 | Temp 97.7°F

## 2020-04-28 VITALS — BP 130/82 | HR 77 | Ht 62.5 in | Wt 187.0 lb

## 2020-04-28 DIAGNOSIS — I10 Essential (primary) hypertension: Secondary | ICD-10-CM

## 2020-04-28 DIAGNOSIS — H9201 Otalgia, right ear: Secondary | ICD-10-CM | POA: Diagnosis not present

## 2020-04-28 DIAGNOSIS — J029 Acute pharyngitis, unspecified: Secondary | ICD-10-CM

## 2020-04-28 DIAGNOSIS — E119 Type 2 diabetes mellitus without complications: Secondary | ICD-10-CM | POA: Diagnosis not present

## 2020-04-28 DIAGNOSIS — H9209 Otalgia, unspecified ear: Secondary | ICD-10-CM | POA: Diagnosis not present

## 2020-04-28 MED ORDER — AZITHROMYCIN 250 MG PO TABS: ORAL_TABLET | ORAL | 0 refills | Status: DC

## 2020-04-28 MED ORDER — HYDROCOD POLST-CPM POLST ER 10-8 MG/5ML PO SUER: 5.0000 mL | Freq: Two times a day (BID) | ORAL | 0 refills | Status: DC | PRN

## 2020-04-28 MED ORDER — HYDROCODONE-HOMATROPINE 5-1.5 MG/5ML PO SYRP: 5.0000 mL | ORAL_SOLUTION | Freq: Three times a day (TID) | ORAL | 0 refills | Status: DC | PRN

## 2020-04-28 NOTE — Patient Instructions (Addendum)
COVID-19 test is pending. Take Zithromax Z-PAK 2 tablets day 1 followed by 1 tablet days 2 through 5. Tussionex 1 teaspoon every 12 hours as needed for cough and/or sore throat/ear pain. Rest and drink plenty of fluids. Quarantine until COVID-19 test is back.

## 2020-04-28 NOTE — Telephone Encounter (Signed)
Scheduled

## 2020-04-28 NOTE — Patient Instructions (Addendum)

## 2020-04-28 NOTE — Progress Notes (Signed)
   Subjective:    Patient ID: Carla Oconnor, female    DOB: Mar 22, 1951, 70 y.o.   MRN: [id number]  HPI 70 year old Female seen today with complaint of sore throat and right ear pain onset Sunday January 9th. No fever or chills. No dysgeusia. No nausea or vomiting. No headache.  Patient has controlled type 2 diabetes mellitus, hypertension, hyperlipidemia, GE reflux.  Review of Systems see above     Objective:   Physical Exam Temperature 97.7 degrees pulse oximetry 97% pulse 88 and regular  Pharynx is clear. TMs are clear bilaterally. Neck is supple. Chest clear to auscultation without rales or wheezing.       Assessment & Plan:  Right otalgia  Acute pharyngitis  Plan: COVID-19 test performed today and results is pending. She will take Zithromax Z-PAK 2 tabs p.o. day 1 followed by 1 p.o. days 2 through 5. Tussionex 1 teaspoon every 12 hours as needed for cough and sore throat pain. Rest and drink plenty of fluids. Quarantine until COVID-19 test result is back.

## 2020-04-28 NOTE — Telephone Encounter (Signed)
Carla Oconnor [phone]  [name] called to say her R ear aches, R side throat, she has drainage, runny nose, no fever, no COVID, exposure, she has had Flu shot and COVID vaccines and booster.

## 2020-04-28 NOTE — Telephone Encounter (Signed)
Waiting room visit late morning

## 2020-04-30 LAB — SARS-COV-2 RNA,(COVID-19) QUALITATIVE NAAT: SARS CoV2 RNA: NOT DETECTED

## 2020-05-02 ENCOUNTER — Other Ambulatory Visit: Payer: Self-pay | Admitting: Internal Medicine

## 2020-05-17 NOTE — Patient Instructions (Signed)
It was a pleasure to see you today.  Continue current medications and follow-up in 6 months.  Flu vaccine given.  Congratulations on weight loss.

## 2020-05-24 ENCOUNTER — Other Ambulatory Visit: Payer: Self-pay | Admitting: Internal Medicine

## 2020-08-05 ENCOUNTER — Telehealth: Payer: Self-pay

## 2020-08-05 NOTE — Telephone Encounter (Signed)
Can see tomorrow

## 2020-08-05 NOTE — Telephone Encounter (Signed)
Left message can see her tomorrow at 12:00pm.

## 2020-08-05 NOTE — Telephone Encounter (Signed)
Patient got sick last week Tuesday, sore throat and coughing. She did a home COVID test on Friday it was negative. Now she is coughing up green, she has tried mucinex and Careers adviser. She would an appt here?

## 2020-08-06 ENCOUNTER — Encounter: Payer: Self-pay | Admitting: Internal Medicine

## 2020-08-06 ENCOUNTER — Other Ambulatory Visit: Payer: Self-pay

## 2020-08-06 ENCOUNTER — Ambulatory Visit: Payer: Medicare PPO | Admitting: Internal Medicine

## 2020-08-06 VITALS — BP 120/80 | HR 68 | Temp 98.6°F | Ht 62.5 in | Wt 183.0 lb

## 2020-08-06 DIAGNOSIS — E119 Type 2 diabetes mellitus without complications: Secondary | ICD-10-CM | POA: Diagnosis not present

## 2020-08-06 DIAGNOSIS — J069 Acute upper respiratory infection, unspecified: Secondary | ICD-10-CM

## 2020-08-06 DIAGNOSIS — I10 Essential (primary) hypertension: Secondary | ICD-10-CM | POA: Diagnosis not present

## 2020-08-06 DIAGNOSIS — R059 Cough, unspecified: Secondary | ICD-10-CM | POA: Diagnosis not present

## 2020-08-06 DIAGNOSIS — J029 Acute pharyngitis, unspecified: Secondary | ICD-10-CM | POA: Diagnosis not present

## 2020-08-06 MED ORDER — HYDROCOD POLST-CPM POLST ER 10-8 MG/5ML PO SUER: 5.0000 mL | Freq: Two times a day (BID) | ORAL | 0 refills | Status: DC | PRN

## 2020-08-06 MED ORDER — DOXYCYCLINE HYCLATE 100 MG PO TABS: 100.0000 mg | ORAL_TABLET | Freq: Two times a day (BID) | ORAL | 0 refills | Status: DC

## 2020-08-06 MED ORDER — METHYLPREDNISOLONE ACETATE 80 MG/ML IJ SUSP
80.0000 mg | Freq: Once | INTRAMUSCULAR | Status: AC
  Administered 2020-08-06: 80 mg via INTRAMUSCULAR

## 2020-08-06 NOTE — Progress Notes (Signed)
   Subjective:    Patient ID: Carla Oconnor, female    DOB: 1951/01/30, 70 y.o.   MRN: [id number]  HPI 70 year old Female seen today in person regarding respiratory infection symptoms.  She worked out in her yard last week and was in a lot of pollen.  Developed sore throat on Tuesday, April 12.  Also had coughing.  She did a home COVID test on Friday, April 15 which was negative.  Now has begun to cough up green sputum.  She tried Mucinex and Allegra without success.  She has had 3 Moderna COVID vaccines the last  one was given in November 2021.  She had a flu vaccine in December 2021.  Patient has a history of osteoarthritis of right knee, hypertension, hyperlipidemia, GE reflux, controlled type 2 diabetes mellitus.    Review of Systems denies shaking chills.  Sounds nasally congested when she speaks.  Is a bit hoarse.  Looks fatigued.     Objective:   Physical Exam Blood pressure 120/80 pulse 68 temperature 98.6 degrees pulse oximetry 97% weight 183 pounds Skin is warm and dry.  Pharynx is very slightly injected without exudate.  TMs slightly dull.  Neck supple.  Chest is clear to auscultation without rales or wheezing.  Is hoarse when she speaks.      Assessment & Plan:  Acute upper respiratory infection  Plan: I have obtained today COVID-19 PCR test by nasal swab and a respiratory virus panel by nasal swab.  She request something for cough and prefers Tussionex.  Have prescribed Tussionex 1 teaspoon every 12 hours as needed for cough and sore throat pain.  Prescribe doxycycline 100 mg twice daily for 10 days.  Rest and drink fluids.  Quarantine until test results are back.  Depo-Medrol 80 mg IM given today in the office.

## 2020-08-06 NOTE — Patient Instructions (Signed)
We are sorry you are not feeling well.  Take Tussionex 1 teaspoon every 12 hours sparingly as needed for cough and sore throat pain.  Take doxycycline 100 mg twice daily with food for 10 days.  COVID-19 PCR test and respiratory virus panel have been obtained by nasal swabbing.  Rest and drink plenty of fluids.  Quarantine until test results are back.  Depo-Medrol 80 mg IM given in office to help with respiratory congestion.

## 2020-08-07 LAB — RESPIRATORY VIRUS PANEL

## 2020-08-07 LAB — SARS-COV-2 RNA,(COVID-19) QUALITATIVE NAAT: SARS CoV2 RNA: NOT DETECTED

## 2020-08-26 NOTE — Progress Notes (Signed)
Scheduled for June 2022

## 2020-09-19 ENCOUNTER — Other Ambulatory Visit: Payer: Self-pay | Admitting: Internal Medicine

## 2020-10-06 ENCOUNTER — Other Ambulatory Visit: Payer: Medicare PPO | Admitting: Internal Medicine

## 2020-10-06 ENCOUNTER — Other Ambulatory Visit: Payer: Self-pay

## 2020-10-06 DIAGNOSIS — E1169 Type 2 diabetes mellitus with other specified complication: Secondary | ICD-10-CM | POA: Diagnosis not present

## 2020-10-06 DIAGNOSIS — I10 Essential (primary) hypertension: Secondary | ICD-10-CM

## 2020-10-06 DIAGNOSIS — E785 Hyperlipidemia, unspecified: Secondary | ICD-10-CM | POA: Diagnosis not present

## 2020-10-06 DIAGNOSIS — E119 Type 2 diabetes mellitus without complications: Secondary | ICD-10-CM

## 2020-10-07 LAB — HEPATIC FUNCTION PANEL
AG Ratio: 1.9 (calc) (ref 1.0–2.5)
ALT: 13 U/L (ref 6–29)
AST: 15 U/L (ref 10–35)
Albumin: 4.3 g/dL (ref 3.6–5.1)
Alkaline phosphatase (APISO): 52 U/L (ref 37–153)
Bilirubin, Direct: 0.2 mg/dL (ref 0.0–0.2)
Globulin: 2.3 g/dL (calc) (ref 1.9–3.7)
Indirect Bilirubin: 0.3 mg/dL (calc) (ref 0.2–1.2)
Total Bilirubin: 0.5 mg/dL (ref 0.2–1.2)
Total Protein: 6.6 g/dL (ref 6.1–8.1)

## 2020-10-07 LAB — COMPLETE METABOLIC PANEL WITH GFR
AG Ratio: 1.9 (calc) (ref 1.0–2.5)
ALT: 13 U/L (ref 6–29)
AST: 15 U/L (ref 10–35)
Albumin: 4.3 g/dL (ref 3.6–5.1)
Alkaline phosphatase (APISO): 52 U/L (ref 37–153)
BUN/Creatinine Ratio: 25 (calc) — ABNORMAL HIGH (ref 6–22)
BUN: 27 mg/dL — ABNORMAL HIGH (ref 7–25)
CO2: 31 mmol/L (ref 20–32)
Calcium: 9.6 mg/dL (ref 8.6–10.4)
Chloride: 101 mmol/L (ref 98–110)
Creat: 1.08 mg/dL — ABNORMAL HIGH (ref 0.60–0.93)
GFR, Est African American: 60 mL/min/{1.73_m2} (ref >=60)
GFR, Est Non African American: 52 mL/min/{1.73_m2} — ABNORMAL LOW (ref >=60)
Globulin: 2.3 g/dL (calc) (ref 1.9–3.7)
Glucose, Bld: 100 mg/dL — ABNORMAL HIGH (ref 65–99)
Potassium: 4.5 mmol/L (ref 3.5–5.3)
Sodium: 143 mmol/L (ref 135–146)
Total Bilirubin: 0.5 mg/dL (ref 0.2–1.2)
Total Protein: 6.6 g/dL (ref 6.1–8.1)

## 2020-10-07 LAB — HEMOGLOBIN A1C
Hgb A1c MFr Bld: 6.1 % of total Hgb — ABNORMAL HIGH (ref <5.7)
Mean Plasma Glucose: 128 mg/dL
eAG (mmol/L): 7.1 mmol/L

## 2020-10-13 ENCOUNTER — Ambulatory Visit (INDEPENDENT_AMBULATORY_CARE_PROVIDER_SITE_OTHER): Payer: Medicare PPO | Admitting: Internal Medicine

## 2020-10-13 ENCOUNTER — Other Ambulatory Visit: Payer: Self-pay

## 2020-10-13 VITALS — BP 130/70 | HR 74 | Ht 62.5 in | Wt 183.0 lb

## 2020-10-13 DIAGNOSIS — M1711 Unilateral primary osteoarthritis, right knee: Secondary | ICD-10-CM | POA: Diagnosis not present

## 2020-10-13 DIAGNOSIS — F411 Generalized anxiety disorder: Secondary | ICD-10-CM | POA: Diagnosis not present

## 2020-10-13 DIAGNOSIS — E1169 Type 2 diabetes mellitus with other specified complication: Secondary | ICD-10-CM

## 2020-10-13 DIAGNOSIS — I10 Essential (primary) hypertension: Secondary | ICD-10-CM | POA: Diagnosis not present

## 2020-10-13 DIAGNOSIS — E785 Hyperlipidemia, unspecified: Secondary | ICD-10-CM

## 2020-10-13 DIAGNOSIS — Z6832 Body mass index (BMI) 32.0-32.9, adult: Secondary | ICD-10-CM | POA: Diagnosis not present

## 2020-10-13 DIAGNOSIS — K219 Gastro-esophageal reflux disease without esophagitis: Secondary | ICD-10-CM

## 2020-10-13 DIAGNOSIS — Z8659 Personal history of other mental and behavioral disorders: Secondary | ICD-10-CM

## 2020-10-13 DIAGNOSIS — R7989 Other specified abnormal findings of blood chemistry: Secondary | ICD-10-CM | POA: Diagnosis not present

## 2020-10-13 NOTE — Progress Notes (Signed)
   Subjective:    Patient ID: Carla Oconnor, female    DOB: 09-14-1950, 70 y.o.   MRN: [id number]  HPI  70 year old Female seen for 6 month recheck.  She has a history of hypertension, hyperlipidemia, GE reflux, controlled type 2 diabetes mellitus, and obesity.  History of anxiety.  History of hypokalemia secondary to diuretic therapy.  Remote history of asthma.  Has albuterol inhaler on hand.  She saw Dr.Nishan, Cardiologist in January 2022.  Has had cardiac CT in March 2019 with coronary calcium score of 3.  He felt her hypertension was well controlled.  She has had a normal catheterization in the remote past.  Issues with osteoarthritis of her right knee which has been seen by Dr. Magnus Ivan.  She had an aspiration of knee joint effusion in October 2021 and injection of Hyaluronan October 2021.  In December she had injection of right knee with lidocaine and methylprednisolone.  This helped a fair amount.  Review of Systems     Objective:   Physical Exam Blood pressure 130/70 pulse 74 pulse oximetry 96% weight 183 pounds height 5 feet 2.5 inches BMI 32.94  Skin: Warm and dry.  No cervical adenopathy or thyromegaly.  No carotid bruits.  Chest is clear to auscultation without rales or wheezing.  Cardiac exam: Regular rate and rhythm normal S1 and S2 without murmurs or gallops.  No significant lower extremity edema.       Assessment & Plan:  Type 2 diabetes mellitus-hemoglobin A1c 6.1% and previously was 6.5% in December 2021.  She is on Glucotrol and metformin.  Hyperlipidemia treated with Crestor 10 mg daily.  Lipid panel in December was excellent.  Total cholesterol 185, HDL 56, triglycerides 159 and LDL 103.  Continue with Crestor, diet and exercise efforts.  Liver functions are normal on Crestor.  Creatinine 1.08 and was 0.91 in December 2021.  Continue to monitor for chronic kidney disease.  Osteoarthritis right knee-continue injections per Dr. Magnus Ivan  History of anxiety and  insomnia treated with Klonopin at bedtime as needed  History of GE reflux treated with Prilosec 20 mg daily  Essential hypertension stable on Diovan HCTZ.  Takes potassium supplement to prevent hypokalemia on diuretic therapy.  BMI 32.94  Plan: Continue current medications.  Follow-up in December 2022 for Medicare wellness and health maintenance exam.

## 2020-10-15 ENCOUNTER — Ambulatory Visit
Admission: RE | Admit: 2020-10-15 | Discharge: 2020-10-15 | Disposition: A | Discharge status: Home / Self Care | Payer: Medicare PPO | Source: Ambulatory Visit | Attending: Internal Medicine | Admitting: Internal Medicine

## 2020-10-15 ENCOUNTER — Other Ambulatory Visit: Payer: Self-pay

## 2020-10-15 DIAGNOSIS — Z1231 Encounter for screening mammogram for malignant neoplasm of breast: Secondary | ICD-10-CM | POA: Diagnosis not present

## 2020-11-08 ENCOUNTER — Encounter: Payer: Self-pay | Admitting: Internal Medicine

## 2020-11-08 NOTE — Patient Instructions (Addendum)
It was a pleasure to see you today.  Continue current medications.  Work on diet and exercise.  Return in 6 months for Medicare wellness and health maintenance exam.  No change in medications.

## 2020-11-26 DIAGNOSIS — L811 Chloasma: Secondary | ICD-10-CM | POA: Diagnosis not present

## 2020-11-26 DIAGNOSIS — L810 Postinflammatory hyperpigmentation: Secondary | ICD-10-CM | POA: Diagnosis not present

## 2020-12-04 ENCOUNTER — Other Ambulatory Visit: Payer: Self-pay | Admitting: Internal Medicine

## 2020-12-29 DIAGNOSIS — E119 Type 2 diabetes mellitus without complications: Secondary | ICD-10-CM | POA: Diagnosis not present

## 2020-12-29 DIAGNOSIS — Z7984 Long term (current) use of oral hypoglycemic drugs: Secondary | ICD-10-CM | POA: Diagnosis not present

## 2020-12-29 DIAGNOSIS — Z961 Presence of intraocular lens: Secondary | ICD-10-CM | POA: Diagnosis not present

## 2020-12-29 DIAGNOSIS — H524 Presbyopia: Secondary | ICD-10-CM | POA: Diagnosis not present

## 2020-12-29 LAB — HM DIABETES EYE EXAM

## 2020-12-30 ENCOUNTER — Encounter: Payer: Self-pay | Admitting: Internal Medicine

## 2021-01-29 DIAGNOSIS — E669 Obesity, unspecified: Secondary | ICD-10-CM | POA: Diagnosis not present

## 2021-01-29 DIAGNOSIS — E785 Hyperlipidemia, unspecified: Secondary | ICD-10-CM | POA: Diagnosis not present

## 2021-01-29 DIAGNOSIS — G4700 Insomnia, unspecified: Secondary | ICD-10-CM | POA: Diagnosis not present

## 2021-01-29 DIAGNOSIS — J45909 Unspecified asthma, uncomplicated: Secondary | ICD-10-CM | POA: Diagnosis not present

## 2021-01-29 DIAGNOSIS — M1990 Unspecified osteoarthritis, unspecified site: Secondary | ICD-10-CM | POA: Diagnosis not present

## 2021-01-29 DIAGNOSIS — I10 Essential (primary) hypertension: Secondary | ICD-10-CM | POA: Diagnosis not present

## 2021-01-29 DIAGNOSIS — K219 Gastro-esophageal reflux disease without esophagitis: Secondary | ICD-10-CM | POA: Diagnosis not present

## 2021-01-29 DIAGNOSIS — E119 Type 2 diabetes mellitus without complications: Secondary | ICD-10-CM | POA: Diagnosis not present

## 2021-01-29 DIAGNOSIS — F411 Generalized anxiety disorder: Secondary | ICD-10-CM | POA: Diagnosis not present

## 2021-02-15 ENCOUNTER — Other Ambulatory Visit: Payer: Self-pay

## 2021-02-15 ENCOUNTER — Ambulatory Visit: Payer: Medicare PPO | Admitting: Internal Medicine

## 2021-02-15 ENCOUNTER — Encounter: Payer: Self-pay | Admitting: Internal Medicine

## 2021-02-15 ENCOUNTER — Telehealth: Payer: Self-pay | Admitting: Internal Medicine

## 2021-02-15 VITALS — BP 126/70 | HR 78 | Temp 98.0°F | Ht 62.5 in | Wt 186.0 lb

## 2021-02-15 DIAGNOSIS — M940 Chondrocostal junction syndrome [Tietze]: Secondary | ICD-10-CM | POA: Diagnosis not present

## 2021-02-15 MED ORDER — IBUPROFEN 800 MG PO TABS: 800.0000 mg | ORAL_TABLET | Freq: Three times a day (TID) | ORAL | 0 refills | Status: DC | PRN

## 2021-02-15 MED ORDER — HYDROCODONE-ACETAMINOPHEN 5-325 MG PO TABS: 1.0000 | ORAL_TABLET | Freq: Four times a day (QID) | ORAL | 0 refills | Status: DC | PRN

## 2021-02-15 NOTE — Progress Notes (Signed)
   Subjective:    Patient ID: Carla Oconnor, female    DOB: 1951-02-15, 70 y.o.   MRN: [id number]  HPI 70 year old Female seen today for acute left parasternal chest pain that had onset around 5 AM today and awaken her from sleep.  She denies any heavy lifting or stretching that would have caused this pain.  She had trouble going back to sleep and was very uncomfortable.  This was very disconcerting to her.  She called this afternoon asking for advice. She has a history of essential hypertension, and a coronary calcium score of 3 in March 2019.  She has had a normal catheterization in the remote past.  She has a history of hyperlipidemia, hypertension, GE reflux, controlled type 2 diabetes mellitus, anxiety and obesity.  Remote history of asthma treated with albuterol inhaler as needed.  History of osteoarthritis of right knee seen by Dr. Magnus Ivan and had aspiration of knee joint effusion in October 2021 with an injection of Hyaluronan.  In December 2021 had injection of right knee with lidocaine and methylprednisolone.  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  Father died age 17 with a stroke.  Mother with history of diabetes and hypertension.  1 sister died of kidney failure with history of breast cancer.  1 sister with history of diabetes and coronary disease.  Total of 4 sisters.  Has 2 full sisters and 2 Sisters.  She does not smoke.   Review of Systems no nausea, vomiting, diaphoresis     Objective:   Physical Exam She is afebrile pulse oximetry 97% weight 186 pounds BMI 33.48 pulse 78 and regular blood pressure 126/70 her chest is clear to auscultation without rales or wheezing.  Cardiac exam regular rate and rhythm without ectopy or murmur.  No lower extremity pitting edema.  She has palpable chest wall tenderness in cartilage on the left adjacent to her sternum.       Assessment & Plan:   Chest wall  pain-left-sided  Plan: Ibuprofen 800 mg up to 3 times daily as needed for pain.  She has Prilosec for GE reflux on hand.  Hydrocodone/APAP (#15 tablets) 1 tablet every 6 hours as needed for moderate pain.  May apply heat or ice to the area whichever feels better.  She has Klonopin on hand for anxiety which may help her sleep.  Continue Diovan HCTZ, rosuvastatin 10 mg daily and glipizide 10 mg daily.  Mammogram ordered in June and was within normal limits

## 2021-02-15 NOTE — Telephone Encounter (Signed)
Carla Oconnor [phone]  [name] called to say she was woke up this morning around 5:00 am with pain in her left breast it lasted off and on until around 9:00 am. Now the pain has come back around 2:00, she would like to come in and be seen.

## 2021-02-15 NOTE — Patient Instructions (Addendum)
Take ibuprofen 800 mg up to 3 times daily as needed.  Hydrocodone APAP sparingly for severe left parasternal pain.  You have been diagnosed with costochondritis which should resolve within a few days.  Call if symptoms do not improve or worsen.  Continue current medications.  Mammogram in June was normal.

## 2021-02-15 NOTE — Telephone Encounter (Signed)
scheduled

## 2021-03-23 ENCOUNTER — Telehealth: Payer: Self-pay | Admitting: Internal Medicine

## 2021-03-23 ENCOUNTER — Ambulatory Visit
Admission: RE | Admit: 2021-03-23 | Discharge: 2021-03-23 | Disposition: A | Discharge status: Home / Self Care | Payer: Medicare PPO | Source: Ambulatory Visit | Attending: Internal Medicine | Admitting: Internal Medicine

## 2021-03-23 ENCOUNTER — Ambulatory Visit: Payer: Medicare PPO | Admitting: Internal Medicine

## 2021-03-23 ENCOUNTER — Other Ambulatory Visit: Payer: Self-pay

## 2021-03-23 VITALS — BP 140/82 | HR 84 | Temp 98.2°F | Resp 17 | Ht 62.5 in | Wt 182.0 lb

## 2021-03-23 DIAGNOSIS — I10 Essential (primary) hypertension: Secondary | ICD-10-CM

## 2021-03-23 DIAGNOSIS — M419 Scoliosis, unspecified: Secondary | ICD-10-CM | POA: Diagnosis not present

## 2021-03-23 DIAGNOSIS — R0781 Pleurodynia: Secondary | ICD-10-CM | POA: Diagnosis not present

## 2021-03-23 DIAGNOSIS — W19XXXA Unspecified fall, initial encounter: Secondary | ICD-10-CM | POA: Diagnosis not present

## 2021-03-23 DIAGNOSIS — M25511 Pain in right shoulder: Secondary | ICD-10-CM

## 2021-03-23 MED ORDER — HYDROCODONE-ACETAMINOPHEN 5-325 MG PO TABS: 1.0000 | ORAL_TABLET | Freq: Four times a day (QID) | ORAL | 0 refills | Status: DC | PRN

## 2021-03-23 NOTE — Telephone Encounter (Signed)
Carla Oconnor [phone]  Carla Oconnor fell a week ago tomorrow on some boxes and she is continuing to have some pain under her right breast and in between her breast and her right shoulder, would like to come in for you to see.

## 2021-03-23 NOTE — Patient Instructions (Addendum)
Take Meloxicam daily and keep doing range of motion exercises for right shoulder. Have right rib detail films today. Take Norco 5/325 sparing every 8-12 hours. Can only provide 5 day supply due to legal restrictions with prescribing narcotics. Call if not better in 7-10 days or sooner if worse.

## 2021-03-23 NOTE — Progress Notes (Signed)
   Subjective:    Patient ID: Carla Oconnor, female    DOB: Nov 07, 1950, 71 y.o.   MRN: [id number]  HPI 70 year old Female reports falling over some boxes in an in-home accident.  She fell face forward onto the boxes.  The fall occurred a week ago tomorrow.  Did not strike her head or her face.  Having some soreness in her breast and right shoulder.  Has been taking ibuprofen.  She fell on November 30.  Also complaining of some right upper extremity pain.  History of hypertension and blood pressure is stable at 140/82  Review of Systems patient has soreness in her rib cage area.  Chest x-ray is normal.  No evidence of rib fracture on rib detail films.     Objective:   Physical Exam Vital signs reviewed.  Blood pressure is 140/82 has slight decreased range of motion of right upper extremity.  Has right chest wall tenderness consistent with rib contusions.  Muscle strength in the right upper extremity is normal.  Slight decreased range of motion in right shoulder.       Assessment & Plan:   Chest wall pain-right rib cage area.  No evidence of rib fracture on x-ray.  Also complaining of right shoulder pain.  Has decreased range of motion of right shoulder which is very mild.  Muscle strength in the right upper extremity is normal.  May take Mobic 15 mg daily.  Take Norco 5/325 sparingly every 8-12 hours.  5-day supply prescribed.  Call if not better in 7 to 10 days.  Essential hypertension-stable on valsartan HCTZ  Impaired glucose tolerance treated with glipizide and metformin  History of anxiety treated with Klonopin at bedtime  Anxiety state-worried that there could be a severe injury with her recent fall.  Also worried because sister was found to have plasmacytoma and right humerus with similar presentation.  Plan: Hydrocodone APAP 5/325 1 tablet every 6 hours as needed for moderate pain.  Apply heat or ice to the rib cage area.  Patient reassured there is no fracture.

## 2021-03-23 NOTE — Telephone Encounter (Signed)
scheduled

## 2021-03-31 ENCOUNTER — Other Ambulatory Visit: Payer: Self-pay | Admitting: Internal Medicine

## 2021-04-06 ENCOUNTER — Other Ambulatory Visit: Payer: Medicare PPO | Admitting: Internal Medicine

## 2021-04-06 ENCOUNTER — Other Ambulatory Visit: Payer: Self-pay

## 2021-04-06 DIAGNOSIS — I10 Essential (primary) hypertension: Secondary | ICD-10-CM | POA: Diagnosis not present

## 2021-04-06 DIAGNOSIS — Z1329 Encounter for screening for other suspected endocrine disorder: Secondary | ICD-10-CM | POA: Diagnosis not present

## 2021-04-06 DIAGNOSIS — E119 Type 2 diabetes mellitus without complications: Secondary | ICD-10-CM | POA: Diagnosis not present

## 2021-04-06 DIAGNOSIS — E1169 Type 2 diabetes mellitus with other specified complication: Secondary | ICD-10-CM

## 2021-04-06 DIAGNOSIS — E785 Hyperlipidemia, unspecified: Secondary | ICD-10-CM | POA: Diagnosis not present

## 2021-04-07 LAB — CBC WITH DIFFERENTIAL/PLATELET
Absolute Monocytes: 474 cells/uL (ref 200–950)
Basophils Absolute: 41 cells/uL (ref 0–200)
Basophils Relative: 0.9 %
Eosinophils Absolute: 78 cells/uL (ref 15–500)
Eosinophils Relative: 1.7 %
HCT: 34.4 % — ABNORMAL LOW (ref 35.0–45.0)
Hemoglobin: 11.3 g/dL — ABNORMAL LOW (ref 11.7–15.5)
Lymphs Abs: 2093 cells/uL (ref 850–3900)
MCH: 29.8 pg (ref 27.0–33.0)
MCHC: 32.8 g/dL (ref 32.0–36.0)
MCV: 90.8 fL (ref 80.0–100.0)
MPV: 10 fL (ref 7.5–12.5)
Monocytes Relative: 10.3 %
Neutro Abs: 1914 cells/uL (ref 1500–7800)
Neutrophils Relative %: 41.6 %
Platelets: 302 10*3/uL (ref 140–400)
RBC: 3.79 10*6/uL — ABNORMAL LOW (ref 3.80–5.10)
RDW: 12.1 % (ref 11.0–15.0)
Total Lymphocyte: 45.5 %
WBC: 4.6 10*3/uL (ref 3.8–10.8)

## 2021-04-07 LAB — LIPID PANEL
Cholesterol: 153 mg/dL (ref <200)
HDL: 49 mg/dL — ABNORMAL LOW (ref >=50)
LDL Cholesterol (Calc): 83 mg/dL (calc)
Non-HDL Cholesterol (Calc): 104 mg/dL (calc) (ref <130)
Total CHOL/HDL Ratio: 3.1 (calc) (ref <5.0)
Triglycerides: 117 mg/dL (ref <150)

## 2021-04-07 LAB — COMPLETE METABOLIC PANEL WITH GFR
AG Ratio: 1.8 (calc) (ref 1.0–2.5)
ALT: 13 U/L (ref 6–29)
AST: 16 U/L (ref 10–35)
Albumin: 4.2 g/dL (ref 3.6–5.1)
Alkaline phosphatase (APISO): 53 U/L (ref 37–153)
BUN: 18 mg/dL (ref 7–25)
CO2: 31 mmol/L (ref 20–32)
Calcium: 9.2 mg/dL (ref 8.6–10.4)
Chloride: 105 mmol/L (ref 98–110)
Creat: 0.97 mg/dL (ref 0.60–1.00)
Globulin: 2.4 g/dL (calc) (ref 1.9–3.7)
Glucose, Bld: 104 mg/dL — ABNORMAL HIGH (ref 65–99)
Potassium: 4.8 mmol/L (ref 3.5–5.3)
Sodium: 144 mmol/L (ref 135–146)
Total Bilirubin: 0.4 mg/dL (ref 0.2–1.2)
Total Protein: 6.6 g/dL (ref 6.1–8.1)
eGFR: 62 mL/min/{1.73_m2} (ref >=60)

## 2021-04-07 LAB — HEMOGLOBIN A1C
Hgb A1c MFr Bld: 6.3 % of total Hgb — ABNORMAL HIGH (ref <5.7)
Mean Plasma Glucose: 134 mg/dL
eAG (mmol/L): 7.4 mmol/L

## 2021-04-07 LAB — MICROALBUMIN / CREATININE URINE RATIO
Creatinine, Urine: 219 mg/dL (ref 20–275)
Microalb Creat Ratio: 4 mcg/mg creat (ref <30)
Microalb, Ur: 0.9 mg/dL

## 2021-04-07 LAB — TSH: TSH: 1.04 mIU/L (ref 0.40–4.50)

## 2021-04-13 ENCOUNTER — Encounter: Payer: Self-pay | Admitting: Internal Medicine

## 2021-04-13 ENCOUNTER — Ambulatory Visit (INDEPENDENT_AMBULATORY_CARE_PROVIDER_SITE_OTHER): Payer: Medicare PPO | Admitting: Internal Medicine

## 2021-04-13 ENCOUNTER — Other Ambulatory Visit: Payer: Self-pay

## 2021-04-13 VITALS — BP 116/70 | HR 76 | Temp 97.8°F | Ht 61.75 in | Wt 181.0 lb

## 2021-04-13 DIAGNOSIS — Z6833 Body mass index (BMI) 33.0-33.9, adult: Secondary | ICD-10-CM

## 2021-04-13 DIAGNOSIS — M898X2 Other specified disorders of bone, upper arm: Secondary | ICD-10-CM | POA: Diagnosis not present

## 2021-04-13 DIAGNOSIS — K219 Gastro-esophageal reflux disease without esophagitis: Secondary | ICD-10-CM

## 2021-04-13 DIAGNOSIS — Z0000 Encounter for general adult medical examination without abnormal findings: Secondary | ICD-10-CM

## 2021-04-13 DIAGNOSIS — E785 Hyperlipidemia, unspecified: Secondary | ICD-10-CM

## 2021-04-13 DIAGNOSIS — E1169 Type 2 diabetes mellitus with other specified complication: Secondary | ICD-10-CM

## 2021-04-13 DIAGNOSIS — R822 Biliuria: Secondary | ICD-10-CM

## 2021-04-13 DIAGNOSIS — F411 Generalized anxiety disorder: Secondary | ICD-10-CM

## 2021-04-13 DIAGNOSIS — M898X9 Other specified disorders of bone, unspecified site: Secondary | ICD-10-CM

## 2021-04-13 LAB — POCT URINALYSIS DIPSTICK
Blood, UA: NEGATIVE
Glucose, UA: NEGATIVE
Ketones, UA: NEGATIVE
Leukocytes, UA: NEGATIVE
Nitrite, UA: NEGATIVE
Protein, UA: NEGATIVE
Spec Grav, UA: >=1.03 — AB (ref 1.010–1.025)
Urobilinogen, UA: 0.2 E.U./dL
pH, UA: 5 (ref 5.0–8.0)

## 2021-04-13 NOTE — Progress Notes (Signed)
Annual Wellness Visit     Patient: Carla Oconnor, Female    DOB: 1950/10/05, 70 y.o.   MRN: [id number] Visit Date: 04/13/2021  Chief Complaint  Patient presents with   Medicare Wellness   Subjective    Carla Oconnor is a 70 y.o. female who presents today for her Annual Wellness Visit.  HPI 70 year old Female for health maintenance exam, evaluation of medical issues and Medicare wellness visit.  Patient says she is having issues with midmorning hypoglycemia. Needs to see dietician.  Her Sister has Plasmacytoma of humerus and is seeing oncologist.  Patient is having pain in the right upper humerus pain for which she takes ibuprofen with relief. Not present everyday. Will order Xray. Will order SPEP.  Addendum: SPEP is negative.  Humerus stem is negative.  History of osteoarthritis of right knee.  Has seen Dr. Magnus Ivan.  History of hysterectomy with right oophorectomy and right salpingectomy in 2003.  History of left lateral epicondylitis in 2013.  Carpal tunnel release right hand in October 2013.  Cesarean sections done 1975 and 1977.  Benign right breast biopsies in 1966 and 1976.  Right trigger finger release September 2009.  Right third trigger finger treated July 2007.  Patient says she had Hepatitis C exposure through her husband but her hepatitis C antibody was negative in December 2017.   Social History   Social History Narrative   Social history: 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  She has one set of twins, boy and girl and also another son.  She lost her nephew during the summer 2019 and a single car accident [address].  Bad weather.  Her husband is a English as a second language teacher and a [place] veteran.       In the spring 2014 she saw cardiologist regarding chest pain.  She had a negative Cardiolite study.  She had previous Cardiolite study in 2009 that was negative.  She had a cardiac cath with Dr. Aleen Campi  in 2003.       Family history: Father died at age 39 with stroke.  Mother with history of diabetes and hypertension.  1 brother in fairly good health but has had trouble with crack addiction.  4 sisters.  She has 2 full sisters and 2 half sisters.  1 sister died of kidney failure with history of breast cancer.  1 sister with history of diabetes and coronary disease.    Patient Care Team: [name], MD as PCP - General Wendall Stade, MD as PCP - Cardiology (Cardiology)  Review of Systems see above regarding arm pain   Objective    Vitals: BP 116/70    Pulse 76    Temp 97.8 F (36.6 C) (Tympanic)    Ht 5' 1.75" (1.568 m)    Wt 181 lb (82.1 kg)    SpO2 98%    BMI 33.37 kg/m   Physical Exam Vitals reviewed.  Constitutional:      Appearance: Normal appearance.  HENT:     Head: Normocephalic and atraumatic.     Right Ear: Tympanic membrane normal.     Left Ear: Tympanic membrane normal.     Nose: Nose normal.     Mouth/Throat:     Pharynx: Oropharynx is clear.  Eyes:     Extraocular Movements: Extraocular movements intact.     Conjunctiva/sclera: Conjunctivae normal.     Pupils: Pupils are equal,  round, and reactive to light.  Neck:     Vascular: No carotid bruit.  Cardiovascular:     Rate and Rhythm: Normal rate and regular rhythm.     Heart sounds: Normal heart sounds. No murmur heard. Pulmonary:     Effort: Pulmonary effort is normal.     Breath sounds: Normal breath sounds. No wheezing or rales.  Abdominal:     General: Bowel sounds are normal. There is no distension.     Palpations: Abdomen is soft. There is no mass.     Tenderness: There is no abdominal tenderness. There is no guarding or rebound.     Hernia: No hernia is present.  Musculoskeletal:     Cervical back: Neck supple. No rigidity.     Right lower leg: No edema.     Left lower leg: No edema.     Comments: Pain to palpation right upper humerus ?  Tendinitis  Lymphadenopathy:     Cervical: No  cervical adenopathy.  Skin:    General: Skin is warm and dry.  Neurological:     General: No focal deficit present.     Mental Status: She is alert.  Psychiatric:        Mood and Affect: Mood normal.        Behavior: Behavior normal.        Thought Content: Thought content normal.        Judgment: Judgment normal.     Most recent functional status assessment: In your present state of health, do you have any difficulty performing the following activities: 04/13/2021  Hearing? N  Vision? N  Difficulty concentrating or making decisions? N  Walking or climbing stairs? N  Dressing or bathing? N  Doing errands, shopping? N  Preparing Food and eating ? N  In the past six months, have you accidently leaked urine? N  Do you have problems with loss of bowel control? N  Managing your Medications? N  Managing your Finances? N  Housekeeping or managing your Housekeeping? N  Some recent data might be hidden   Most recent fall risk assessment: Fall Risk  03/23/2021  Falls in the past year? 1  Number falls in past yr: 0  Injury with Fall? 1  Comment -  Risk for fall due to : History of fall(s);No Fall Risks  Follow up Falls evaluation completed    Most recent depression screenings: PHQ 2/9 Scores 04/13/2021 04/09/2020  PHQ - 2 Score 0 0  PHQ- 9 Score - -  Exception Documentation - -  Not completed - -   Most recent cognitive screening: 6CIT Screen 04/13/2021  What Year? 0 points  What month? 0 points  What time? 0 points  Count back from 20 0 points  Months in reverse 0 points  Repeat phrase 0 points  Total Score 0       Assessment & Plan  Right upper humerus pain-to get x-ray.  Worried about bone tumor since sister had plasmacytoma.  Addendum SPEP was negative and x-ray was negative  Will be treated with short course of Norco 5/325 sparingly.  May apply heat or ice to the upper arm area.  Hypertension-stable on valsartan HCTZ  Hyperlipidemia treated with Crestor 10 mg  daily and lipid panel is within normal limits but she has a low HDL of 49.  GE reflux treated with omeprazole  Controlled type 2 diabetes mellitus-hemoglobin A1c 6.3%.  Currently treated with glipizide XL 10 mg daily and metformin  Obesity-encouraged  to work on diet and exercise.  BMI 33  History of anxiety  Plan: Follow-up in 6 months for 64-month recheck.  Encouraged to work on diet and exercise.  Suggest patient work with dietitian  Addendum: X-ray is negative of the right humerus and SPEP is negative     Annual wellness visit done today including the all of the following: Reviewed patient's Family Medical History Reviewed and updated list of patient's medical providers Assessment of cognitive impairment was done Assessed patient's functional ability Established a written schedule for health screening services Health Risk Assessent Completed and Reviewed  Discussed health benefits of physical activity, and encouraged her to engage in regular exercise appropriate for her age and condition.         IMargaree Mackintosh, MD, have reviewed all documentation for this visit. The documentation on 05/15/21 for the exam, diagnosis, procedures, and orders are all accurate and complete.   Jama Flavors, CMA

## 2021-04-13 NOTE — Patient Instructions (Addendum)
Suggest continuing diet and exercise regimen. Make dietician referral because of some morning hypoglycemia issues.Xray right humerus and do SPEP. Needs Covid booster.  Needs to work on diet exercise and weight loss.  Have x-ray of right arm because of pain in the upper arm and patient worried about sister who has plasmacytoma.  Return in 6 months  Addendum: SPEP is negative and x-ray is negative.

## 2021-04-14 ENCOUNTER — Ambulatory Visit
Admission: RE | Admit: 2021-04-14 | Discharge: 2021-04-14 | Disposition: A | Discharge status: Home / Self Care | Payer: Medicare PPO | Source: Ambulatory Visit | Attending: Internal Medicine | Admitting: Internal Medicine

## 2021-04-14 DIAGNOSIS — M25511 Pain in right shoulder: Secondary | ICD-10-CM | POA: Diagnosis not present

## 2021-04-14 LAB — COMPLETE METABOLIC PANEL WITH GFR
AG Ratio: 1.7 (calc) (ref 1.0–2.5)
ALT: 14 U/L (ref 6–29)
AST: 18 U/L (ref 10–35)
Albumin: 4.3 g/dL (ref 3.6–5.1)
Alkaline phosphatase (APISO): 58 U/L (ref 37–153)
BUN/Creatinine Ratio: 20 (calc) (ref 6–22)
BUN: 21 mg/dL (ref 7–25)
CO2: 27 mmol/L (ref 20–32)
Calcium: 9.6 mg/dL (ref 8.6–10.4)
Chloride: 103 mmol/L (ref 98–110)
Creat: 1.05 mg/dL — ABNORMAL HIGH (ref 0.60–1.00)
Globulin: 2.5 g/dL (calc) (ref 1.9–3.7)
Glucose, Bld: 152 mg/dL — ABNORMAL HIGH (ref 65–99)
Potassium: 4.2 mmol/L (ref 3.5–5.3)
Sodium: 142 mmol/L (ref 135–146)
Total Bilirubin: 0.6 mg/dL (ref 0.2–1.2)
Total Protein: 6.8 g/dL (ref 6.1–8.1)
eGFR: 57 mL/min/{1.73_m2} — ABNORMAL LOW (ref >=60)

## 2021-04-15 ENCOUNTER — Encounter: Payer: Self-pay | Admitting: Internal Medicine

## 2021-04-15 LAB — PROTEIN ELECTROPHORESIS, SERUM
Albumin ELP: 4.3 g/dL (ref 3.8–4.8)
Alpha 1: 0.3 g/dL (ref 0.2–0.3)
Alpha 2: 0.5 g/dL (ref 0.5–0.9)
Beta 2: 0.3 g/dL (ref 0.2–0.5)
Beta Globulin: 0.4 g/dL (ref 0.4–0.6)
Gamma Globulin: 1 g/dL (ref 0.8–1.7)
Total Protein: 6.8 g/dL (ref 6.1–8.1)

## 2021-05-11 ENCOUNTER — Other Ambulatory Visit: Payer: Self-pay | Admitting: Internal Medicine

## 2021-05-24 ENCOUNTER — Other Ambulatory Visit: Payer: Self-pay | Admitting: Internal Medicine

## 2021-05-25 ENCOUNTER — Other Ambulatory Visit: Payer: Self-pay | Admitting: Internal Medicine

## 2021-07-26 ENCOUNTER — Other Ambulatory Visit: Payer: Self-pay | Admitting: Internal Medicine

## 2021-08-01 ENCOUNTER — Other Ambulatory Visit: Payer: Self-pay | Admitting: Internal Medicine

## 2021-10-01 ENCOUNTER — Other Ambulatory Visit: Payer: Self-pay | Admitting: Internal Medicine

## 2021-10-07 ENCOUNTER — Other Ambulatory Visit: Payer: Medicare PPO

## 2021-10-07 DIAGNOSIS — E1169 Type 2 diabetes mellitus with other specified complication: Secondary | ICD-10-CM | POA: Diagnosis not present

## 2021-10-07 DIAGNOSIS — E119 Type 2 diabetes mellitus without complications: Secondary | ICD-10-CM | POA: Diagnosis not present

## 2021-10-07 DIAGNOSIS — E785 Hyperlipidemia, unspecified: Secondary | ICD-10-CM | POA: Diagnosis not present

## 2021-10-12 ENCOUNTER — Encounter: Payer: Self-pay | Admitting: Internal Medicine

## 2021-10-12 ENCOUNTER — Ambulatory Visit: Payer: Medicare PPO | Admitting: Internal Medicine

## 2021-10-12 VITALS — BP 136/82 | HR 69 | Temp 98.2°F | Ht 61.75 in | Wt 179.8 lb

## 2021-10-12 DIAGNOSIS — Z8659 Personal history of other mental and behavioral disorders: Secondary | ICD-10-CM | POA: Diagnosis not present

## 2021-10-12 DIAGNOSIS — E1169 Type 2 diabetes mellitus with other specified complication: Secondary | ICD-10-CM | POA: Diagnosis not present

## 2021-10-12 DIAGNOSIS — R822 Biliuria: Secondary | ICD-10-CM | POA: Diagnosis not present

## 2021-10-12 DIAGNOSIS — Z6833 Body mass index (BMI) 33.0-33.9, adult: Secondary | ICD-10-CM | POA: Diagnosis not present

## 2021-10-12 DIAGNOSIS — E785 Hyperlipidemia, unspecified: Secondary | ICD-10-CM | POA: Diagnosis not present

## 2021-10-12 DIAGNOSIS — M7918 Myalgia, other site: Secondary | ICD-10-CM

## 2021-10-12 MED ORDER — CYCLOBENZAPRINE HCL 10 MG PO TABS: 10.0000 mg | ORAL_TABLET | Freq: Every day | ORAL | 1 refills | Status: DC

## 2021-10-12 MED ORDER — MELOXICAM 15 MG PO TABS: 15.0000 mg | ORAL_TABLET | Freq: Every day | ORAL | 0 refills | Status: DC

## 2021-10-12 NOTE — Progress Notes (Signed)
Subjective:    Patient ID: Carla Oconnor, female    DOB: 04/08/1951, 70 y.o.   MRN: [id number]  HPI 70 year old Female seen for 6 month follow up on hypertension, hyperlipidemia, GE reflux, obesity, anxiety and controlled type 2 diabetes mellitus.  Fasting glucose is 108 and hemoglobin A1c is 6.2%.  She is on metformin 1000 mg twice daily and glipizide 10 mg XL twice daily.  Continue with diet exercise and weight loss efforts.  We can add additional medication for glucose control but will reassess in 6 months.  History of anxiety treated with Klonopin.  Her creatinine is elevated at 1.20.  This may be a reflection of volume depletion as she is fasting today.  We will ask her to rehydrate and repeat b- met in a few days.  Liver functions are normal.  Lipid panel is normal except for an LDL of 105.  History of GE reflux treated with PPI.  Review of Systems see above     Objective:   Physical Exam Blood pressure 136/82 pulse 69 temperature 98.2 degrees pulse oximetry 98% weight 179 pounds 12 ounces height 5 feet 1.75 inches BMI 33.14  Skin: Warm and dry.  No cervical adenopathy or thyromegaly.  No carotid bruits.  Chest clear.  Cardiac exam: Regular rate and rhythm without ectopy.  No lower extremity pitting edema.     Assessment & Plan:  Elevated serum creatinine likely is a result of volume depletion as she did not have fluids prior to coming to the office.  Elevated LDL of 105-continue statin medication  Type 2 diabetes mellitus with hemoglobin A1c stable at 6.2%.  Watch diet, try to exercise and take metformin 1000 mg twice daily as prescribed.  Also takes glipizide 10 mg XL daily  Prescribed Mobic 15 mg daily for musculoskeletal pain.  May take Flexeril 1/2 to 1 tablet at bedtime as well for musculoskeletal pain.  Hypertension stable on valsartan HCTZ 1 60-25 daily  Hyperlipidemia treated with Crestor 10 mg daily  History of anxiety treated with Klonopin at bedtime  GE  reflux treated with PPI 

## 2021-10-13 LAB — HEPATIC FUNCTION PANEL
AG Ratio: 1.8 (calc) (ref 1.0–2.5)
ALT: 13 U/L (ref 6–29)
AST: 17 U/L (ref 10–35)
Albumin: 4.2 g/dL (ref 3.6–5.1)
Alkaline phosphatase (APISO): 58 U/L (ref 37–153)
Bilirubin, Direct: 0.1 mg/dL (ref 0.0–0.2)
Globulin: 2.4 g/dL (calc) (ref 1.9–3.7)
Indirect Bilirubin: 0.4 mg/dL (calc) (ref 0.2–1.2)
Total Bilirubin: 0.5 mg/dL (ref 0.2–1.2)
Total Protein: 6.6 g/dL (ref 6.1–8.1)

## 2021-10-13 LAB — LIPID PANEL
Cholesterol: 185 mg/dL (ref <200)
HDL: 55 mg/dL (ref >=50)
LDL Cholesterol (Calc): 105 mg/dL (calc) — ABNORMAL HIGH
Non-HDL Cholesterol (Calc): 130 mg/dL (calc) — ABNORMAL HIGH (ref <130)
Total CHOL/HDL Ratio: 3.4 (calc) (ref <5.0)
Triglycerides: 142 mg/dL (ref <150)

## 2021-10-13 LAB — COMPLETE METABOLIC PANEL WITH GFR

## 2021-10-13 LAB — HEMOGLOBIN A1C
Hgb A1c MFr Bld: 6.2 % of total Hgb — ABNORMAL HIGH (ref <5.7)
Mean Plasma Glucose: 131 mg/dL
eAG (mmol/L): 7.3 mmol/L

## 2021-10-15 ENCOUNTER — Other Ambulatory Visit: Payer: Medicare PPO

## 2021-10-15 DIAGNOSIS — R822 Biliuria: Secondary | ICD-10-CM | POA: Diagnosis not present

## 2021-10-16 LAB — COMPLETE METABOLIC PANEL WITH GFR
AG Ratio: 1.6 (calc) (ref 1.0–2.5)
ALT: 11 U/L (ref 6–29)
AST: 14 U/L (ref 10–35)
Albumin: 4.1 g/dL (ref 3.6–5.1)
Alkaline phosphatase (APISO): 52 U/L (ref 37–153)
BUN/Creatinine Ratio: 18 (calc) (ref 6–22)
BUN: 21 mg/dL (ref 7–25)
CO2: 31 mmol/L (ref 20–32)
Calcium: 9.3 mg/dL (ref 8.6–10.4)
Chloride: 103 mmol/L (ref 98–110)
Creat: 1.2 mg/dL — ABNORMAL HIGH (ref 0.60–1.00)
Globulin: 2.6 g/dL (calc) (ref 1.9–3.7)
Glucose, Bld: 108 mg/dL — ABNORMAL HIGH (ref 65–99)
Potassium: 4.5 mmol/L (ref 3.5–5.3)
Sodium: 142 mmol/L (ref 135–146)
Total Bilirubin: 0.7 mg/dL (ref 0.2–1.2)
Total Protein: 6.7 g/dL (ref 6.1–8.1)
eGFR: 48 mL/min/{1.73_m2} — ABNORMAL LOW (ref >=60)

## 2021-10-25 ENCOUNTER — Encounter: Payer: Self-pay | Admitting: Internal Medicine

## 2021-10-25 ENCOUNTER — Ambulatory Visit: Payer: Medicare PPO | Admitting: Internal Medicine

## 2021-10-25 VITALS — BP 118/80 | HR 78 | Temp 97.7°F | Ht 61.75 in | Wt 179.8 lb

## 2021-10-25 DIAGNOSIS — E1169 Type 2 diabetes mellitus with other specified complication: Secondary | ICD-10-CM | POA: Diagnosis not present

## 2021-10-25 DIAGNOSIS — R7989 Other specified abnormal findings of blood chemistry: Secondary | ICD-10-CM

## 2021-10-25 DIAGNOSIS — I10 Essential (primary) hypertension: Secondary | ICD-10-CM | POA: Diagnosis not present

## 2021-10-25 DIAGNOSIS — E785 Hyperlipidemia, unspecified: Secondary | ICD-10-CM

## 2021-10-25 DIAGNOSIS — K219 Gastro-esophageal reflux disease without esophagitis: Secondary | ICD-10-CM

## 2021-10-25 NOTE — Progress Notes (Signed)
   Subjective:    Patient ID: Carla Oconnor, female    DOB: 04/25/1950, 71 y.o.   MRN: [id number]  HPI 71 year old Female seen at my request for elevated serum creatinine noted on recent blood draw on  June 30th after visit here on June 27 for 6-month follow-up.  Patient reports she had had very little to drink that morning and was fasting.  Creatinine was 1.20 that day and 6 months ago was 1.05.  She came today well-hydrated . B-met is drawn today and results are pending.  She feels well with no new complaints.  She has a history of hypertension treated with valsartan HCTZ.  History of hyperlipidemia treated with Crestor.  History of GE reflux treated with omeprazole and controlled type 2 Diabetes mellitus treated with glipizide and metformin.  Her health maintenance exam was done in December 2022.  Her sister has hx of plasmacytoma.Patient has had SPEP which is negative.  Review of Systems no new complaints     Objective:   Physical Exam   BP 118/80, pulse 78 regular, T 97.7, pulse ox 97%  Neck is supple.  No thyromegaly.  No carotid bruits.  Chest clear.  Cardiac exam: Regular rate and rhythm.  No lower extremity edema.  Basic metabolic panel drawn and results are pending.    Assessment & Plan:   Elevated serum creatinine-patient presents today well-hydrated and basic metabolic panel was repeated well-hydrated.  Results are pending.  We will let her know these results with further instructions to follow.  Essential hypertension-stable on current regimen  Type 2 diabetes mellitus  Hyperlipidemia treated with statin  GE reflux treated with omeprazole  Plan: Await lab results with further instructions to follow.  Her health maintenance exam is scheduled for January 2024. 

## 2021-10-25 NOTE — Patient Instructions (Signed)
Basic metabolic panel including serum creatinine repeated today.  Results are pending and we will notify you as soon as we have a result likely in 24 hours.  For now, continue current medications.  Further instructions to follow.  It was a pleasure to see you today.

## 2021-10-26 LAB — BASIC METABOLIC PANEL
BUN: 17 mg/dL (ref 7–25)
CO2: 29 mmol/L (ref 20–32)
Calcium: 9.2 mg/dL (ref 8.6–10.4)
Chloride: 102 mmol/L (ref 98–110)
Creat: 0.97 mg/dL (ref 0.60–1.00)
Glucose, Bld: 102 mg/dL — ABNORMAL HIGH (ref 65–99)
Potassium: 4.4 mmol/L (ref 3.5–5.3)
Sodium: 140 mmol/L (ref 135–146)

## 2021-10-31 NOTE — Patient Instructions (Addendum)
Patient will have repeat creatinine in a few weeks after drinking 3 glasses of water prior to coming to the office.  Creatinine is elevated at 1.20 but she had fasted overnight.  Continue with current medications as previously prescribed.  Continue to watch diet.  Exercise if possible.  Continue reflux medication and Klonopin for anxiety.  Prescribed Mobic 15 mg daily for musculoskeletal pain.  May take Flexeril at bedtime for musculoskeletal pain.

## 2021-11-01 ENCOUNTER — Encounter: Payer: Self-pay | Admitting: Physician Assistant

## 2021-11-01 ENCOUNTER — Ambulatory Visit (INDEPENDENT_AMBULATORY_CARE_PROVIDER_SITE_OTHER): Payer: Medicare PPO

## 2021-11-01 ENCOUNTER — Ambulatory Visit: Payer: Medicare PPO | Admitting: Physician Assistant

## 2021-11-01 DIAGNOSIS — M1711 Unilateral primary osteoarthritis, right knee: Secondary | ICD-10-CM

## 2021-11-01 MED ORDER — METHYLPREDNISOLONE ACETATE 40 MG/ML IJ SUSP
40.0000 mg | INTRAMUSCULAR | Status: AC | PRN
  Administered 2021-11-01: 40 mg via INTRA_ARTICULAR

## 2021-11-01 MED ORDER — LIDOCAINE HCL 1 % IJ SOLN
3.0000 mL | INTRAMUSCULAR | Status: AC | PRN
  Administered 2021-11-01: 3 mL

## 2021-11-01 NOTE — Progress Notes (Signed)
R kn

## 2021-11-01 NOTE — Progress Notes (Signed)
Office Visit Note   Patient: Carla Oconnor           Date of Birth: 24-May-1950           MRN: [id number] Visit Date: 11/01/2021              Requested by: [name], MD [address] [place],  [place] [address] PCP: [name], MD   Assessment & Plan: Visit Diagnoses:  1. Primary osteoarthritis of right knee     Plan: She will work on Dance movement psychotherapist.  Follow-up with [place] 2 weeks see what type of response she had to the injection.  Questions were encouraged and answered at length today.  Follow-Up Instructions: Return in about 2 weeks (around 11/15/2021).   Orders:  Orders Placed This Encounter  Procedures   Large Joint Inj   XR Knee 1-2 Views Right   No orders of the defined types were placed in this encounter.     Procedures: Large Joint Inj: R knee on 11/01/2021 6:20 PM Indications: pain Details: 22 G 1.5 in needle, anterolateral approach  Arthrogram: No  Medications: 3 mL lidocaine 1 %; 40 mg methylPREDNISolone acetate 40 MG/ML Aspirate: 8 mL blood-tinged Outcome: tolerated well, no immediate complications Procedure, treatment alternatives, risks and benefits explained, specific risks discussed. Consent was given by the patient. Immediately prior to procedure a time out was called to verify the correct patient, procedure, equipment, support staff and site/side marked as required. Patient was prepped and draped in the usual sterile fashion.       Clinical Data: No additional findings.   Subjective: Chief Complaint  Patient presents with   Right Knee - Pain    HPI Carla Oconnor is a 71 year old female well-known to Dr. Raye Sorrow service comes in today due to right knee pain.  She was last seen 04/06/2020 for right knee.  Over the last 6 months she has had pain in the knee when sleeping she has to elevate the knee.  She notes on and off swelling in the knee.  He notes that the pain in the knee is limiting her walking.  She is having no  mechanical symptoms in the knee.  She has no difficulty with going up or down stairs but has pain in the knee with prolonged sitting.  She has tried Tylenol and ibuprofen.  She is diabetic reports her hemoglobin A1c was 6.2 recently. Review of Systems Negative for fevers or chills.  Objective: Vital Signs: There were no vitals taken for this visit.  Physical Exam General: Well-developed well-nourished female no acute distress. Psych: Alert and oriented x3 Ortho Exam Right knee good range of motion.  Slight patellofemoral crepitus.  Slight effusion no abnormal warmth or erythema right knee.  No instability valgus stressing of either knee. Specialty Comments:  No specialty comments available.  Imaging: XR Knee 1-2 Views Right  Result Date: 11/01/2021 Right knee 2 views: No acute fracture.  Knee is well located.  Medial lateral joint lines well-maintained.  Mild patellofemoral arthritic changes.    PMFS History: Patient Active Problem List   Diagnosis Date Noted   S/P right knee arthroscopy 12/12/2018   Acute medial meniscal tear, right, initial encounter 10/08/2018   Herpes simplex type II infection 05/06/2017   Chronic back pain 10/25/2013   Metabolic syndrome 08/17/2013   Anxiety state, unspecified 05/28/2012   Insomnia 05/28/2012   Hyperlipidemia 01/17/2011   Hypertension 01/17/2011   GE reflux 01/17/2011   Asthma 01/17/2011  Fibrocystic breast disease 01/17/2011   Diabetes mellitus (HCC) 01/17/2011   Past Medical History:  Diagnosis Date   Asthma    as child and seasonal   Diabetes mellitus without complication (HCC)    Fibrocystic breast    GERD (gastroesophageal reflux disease)    Hyperlipidemia    Hypertension    MRSA infection 2008   right arm   Neuromuscular disorder (HCC)    tennis elbow on right and left arm   S/P hysterectomy     Family History  Problem Relation Age of Onset   Stroke Father    Breast cancer Sister    Cancer Sister        in her  shoulder   Asthma Other    Hypertension Other    Cancer Other    Diabetes Other    Hyperlipidemia Other     Past Surgical History:  Procedure Laterality Date   ABDOMINAL HYSTERECTOMY     2003- still has left ovary   BREAST EXCISIONAL BIOPSY     BREAST SURGERY     biopsy rt breast-1960- benign.Left breast 1975  - lumpectomy - benign   CESAREAN SECTION     COLONOSCOPY     KNEE ARTHROSCOPY Right 12/06/2018   Procedure: RIGHT KNEE ARTHROSCOPY WITH SYNOVECTOMY/DEBRIDEMENT;  Surgeon: Kathryne Hitch, MD;  Location: [facility];  Service: Orthopedics;  Laterality: Right;   TRIGGER FINGER RELEASE  02/16/2012   Procedure: RELEASE TRIGGER FINGER/A-1 PULLEY;  Surgeon: Wyn Forster., MD;  Location: [facility];  Service: Orthopedics;  Laterality: Right;  RIGHT RING FINGER    Social History   Occupational History   Not on file  Tobacco Use   Smoking status: Never   Smokeless tobacco: Never  Vaping Use   Vaping Use: Never used  Substance and Sexual Activity   Alcohol use: Yes    Comment: rarely   Drug use: No   Sexual activity: Not Currently

## 2021-11-15 ENCOUNTER — Ambulatory Visit: Payer: Medicare PPO | Admitting: Physician Assistant

## 2021-11-17 DIAGNOSIS — M62830 Muscle spasm of back: Secondary | ICD-10-CM | POA: Diagnosis not present

## 2021-11-17 DIAGNOSIS — M545 Low back pain, unspecified: Secondary | ICD-10-CM | POA: Diagnosis not present

## 2021-11-17 DIAGNOSIS — M62838 Other muscle spasm: Secondary | ICD-10-CM | POA: Diagnosis not present

## 2021-11-17 DIAGNOSIS — M9903 Segmental and somatic dysfunction of lumbar region: Secondary | ICD-10-CM | POA: Diagnosis not present

## 2021-11-17 DIAGNOSIS — M542 Cervicalgia: Secondary | ICD-10-CM | POA: Diagnosis not present

## 2021-11-17 DIAGNOSIS — M9901 Segmental and somatic dysfunction of cervical region: Secondary | ICD-10-CM | POA: Diagnosis not present

## 2021-11-17 DIAGNOSIS — M9902 Segmental and somatic dysfunction of thoracic region: Secondary | ICD-10-CM | POA: Diagnosis not present

## 2021-11-17 DIAGNOSIS — M546 Pain in thoracic spine: Secondary | ICD-10-CM | POA: Diagnosis not present

## 2021-11-22 ENCOUNTER — Other Ambulatory Visit: Payer: Self-pay | Admitting: Internal Medicine

## 2021-12-16 DIAGNOSIS — E119 Type 2 diabetes mellitus without complications: Secondary | ICD-10-CM | POA: Diagnosis not present

## 2021-12-16 DIAGNOSIS — E876 Hypokalemia: Secondary | ICD-10-CM | POA: Diagnosis not present

## 2021-12-16 DIAGNOSIS — F411 Generalized anxiety disorder: Secondary | ICD-10-CM | POA: Diagnosis not present

## 2021-12-16 DIAGNOSIS — K219 Gastro-esophageal reflux disease without esophagitis: Secondary | ICD-10-CM | POA: Diagnosis not present

## 2021-12-16 DIAGNOSIS — I10 Essential (primary) hypertension: Secondary | ICD-10-CM | POA: Diagnosis not present

## 2021-12-16 DIAGNOSIS — F439 Reaction to severe stress, unspecified: Secondary | ICD-10-CM | POA: Diagnosis not present

## 2021-12-16 DIAGNOSIS — J45909 Unspecified asthma, uncomplicated: Secondary | ICD-10-CM | POA: Diagnosis not present

## 2021-12-16 DIAGNOSIS — E669 Obesity, unspecified: Secondary | ICD-10-CM | POA: Diagnosis not present

## 2021-12-16 DIAGNOSIS — E785 Hyperlipidemia, unspecified: Secondary | ICD-10-CM | POA: Diagnosis not present

## 2021-12-21 DIAGNOSIS — Z20822 Contact with and (suspected) exposure to covid-19: Secondary | ICD-10-CM | POA: Diagnosis not present

## 2021-12-22 ENCOUNTER — Ambulatory Visit: Payer: Medicare PPO | Admitting: Internal Medicine

## 2021-12-22 ENCOUNTER — Encounter: Payer: Self-pay | Admitting: Internal Medicine

## 2021-12-22 ENCOUNTER — Telehealth: Payer: Self-pay | Admitting: Internal Medicine

## 2021-12-22 VITALS — BP 136/84 | HR 70 | Temp 97.8°F

## 2021-12-22 DIAGNOSIS — J069 Acute upper respiratory infection, unspecified: Secondary | ICD-10-CM | POA: Diagnosis not present

## 2021-12-22 DIAGNOSIS — Z20822 Contact with and (suspected) exposure to covid-19: Secondary | ICD-10-CM | POA: Diagnosis not present

## 2021-12-22 DIAGNOSIS — H6692 Otitis media, unspecified, left ear: Secondary | ICD-10-CM

## 2021-12-22 MED ORDER — HYDROCOD POLI-CHLORPHE POLI ER 10-8 MG/5ML PO SUER: 5.0000 mL | Freq: Two times a day (BID) | ORAL | 0 refills | Status: DC | PRN

## 2021-12-22 MED ORDER — AZITHROMYCIN 250 MG PO TABS: ORAL_TABLET | ORAL | 0 refills | Status: AC

## 2021-12-22 MED ORDER — ROSUVASTATIN CALCIUM 10 MG PO TABS: 10.0000 mg | ORAL_TABLET | Freq: Every day | ORAL | 3 refills | Status: DC

## 2021-12-22 NOTE — Telephone Encounter (Signed)
Carla Oconnor [phone]  Anays called to say Monday night she started getting a sore throat, it is really dry and burning, cough,, stuffy nose with greenish mucus, no fever. She is going to do COVID test and call me back.

## 2021-12-22 NOTE — Telephone Encounter (Signed)
scheduled

## 2021-12-22 NOTE — Progress Notes (Signed)
   Subjective:    Patient ID: Carla Oconnor, female    DOB: May 29, 1950, 71 y.o.   MRN: [id number]  HPI 71 year old Female with history of type 2 diabetes mellitus, hypertension and hyperlipidemia as well as GE reflux seen today with acute upper respiratory infection.  Denies any travel history.  2 days ago and had onset of sore throat, cough, stuffy nose and discolored nasal drainage.  No fever.  She called for an appointment and was advised to do COVID test which proved to be negative.  No known COVID exposure.    Review of Systems review orts malaise and fatigue as well as sore throat     Objective:   Physical Exam Blood pressure 136/84 pulse 70 temperature 97.8 degrees by ear thermometer pulse oximetry 97% Left TM is dull and slightly full.  Pharynx is not significantly red even though she has sore throat.  Right TM is normal.  Neck is supple without adenopathy.  Chest clear to auscultation without rales or wheezing.      Assessment & Plan:  Left otitis media  Acute URI  Hyperlipidemia-Crestor refilled at patient request  Plan: Zithromax Z-PAK 2 tabs day 1 followed by 1 tab days 2 through 5.  Rest and drink fluids.  Call if not improving in 48 hours or sooner if worse.  Tussionex 1 teaspoon every 12 hours as needed for cough and sore throat pain.

## 2021-12-25 DIAGNOSIS — Z20822 Contact with and (suspected) exposure to covid-19: Secondary | ICD-10-CM | POA: Diagnosis not present

## 2021-12-26 DIAGNOSIS — Z20822 Contact with and (suspected) exposure to covid-19: Secondary | ICD-10-CM | POA: Diagnosis not present

## 2021-12-27 NOTE — Patient Instructions (Addendum)
You have been diagnosed with an acute left otitis media and an upper respiratory infection.  Take Zithromax Z-PAK 2 tabs day 1 followed by 1 tab days 2 through 5.  Rest and stay well-hydrated.  Call if not improving in 48 hours or sooner if worse.  Tussionex cough syrup 1 teaspoon every 12 hours as needed for cough and sore throat pain.  Crestor refilled at patient request

## 2021-12-29 DIAGNOSIS — Z20822 Contact with and (suspected) exposure to covid-19: Secondary | ICD-10-CM | POA: Diagnosis not present

## 2021-12-30 DIAGNOSIS — Z20822 Contact with and (suspected) exposure to covid-19: Secondary | ICD-10-CM | POA: Diagnosis not present

## 2022-01-02 DIAGNOSIS — Z20822 Contact with and (suspected) exposure to covid-19: Secondary | ICD-10-CM | POA: Diagnosis not present

## 2022-01-03 DIAGNOSIS — Z20822 Contact with and (suspected) exposure to covid-19: Secondary | ICD-10-CM | POA: Diagnosis not present

## 2022-01-06 DIAGNOSIS — Z20822 Contact with and (suspected) exposure to covid-19: Secondary | ICD-10-CM | POA: Diagnosis not present

## 2022-01-07 DIAGNOSIS — Z20822 Contact with and (suspected) exposure to covid-19: Secondary | ICD-10-CM | POA: Diagnosis not present

## 2022-01-24 ENCOUNTER — Telehealth: Payer: Self-pay | Admitting: Internal Medicine

## 2022-01-24 NOTE — Telephone Encounter (Signed)
Rayn Shorb [phone]  Jana Half called to say she is having right elbow pain, it is so bad she is having trouble lifting coffee mug or glass, also having trouble sleeping. She said she had this a few years ago and you gave her a shot in the elbow and some meloxican and it got better. I let her know you are out of office, she may try to go to [facility] walk in clinic tomorrow or call back. She did say that the arthritis strength tylenol helps some. I let her know it may be Friday before we could get her in.

## 2022-01-26 DIAGNOSIS — M7711 Lateral epicondylitis, right elbow: Secondary | ICD-10-CM | POA: Diagnosis not present

## 2022-01-26 DIAGNOSIS — M79642 Pain in left hand: Secondary | ICD-10-CM | POA: Diagnosis not present

## 2022-01-26 DIAGNOSIS — M6530 Trigger finger, unspecified finger: Secondary | ICD-10-CM | POA: Diagnosis not present

## 2022-01-26 DIAGNOSIS — M65322 Trigger finger, left index finger: Secondary | ICD-10-CM | POA: Diagnosis not present

## 2022-01-26 DIAGNOSIS — M25521 Pain in right elbow: Secondary | ICD-10-CM | POA: Diagnosis not present

## 2022-02-02 ENCOUNTER — Telehealth: Payer: Self-pay | Admitting: Internal Medicine

## 2022-02-02 ENCOUNTER — Encounter: Payer: Self-pay | Admitting: Internal Medicine

## 2022-02-02 DIAGNOSIS — F419 Anxiety disorder, unspecified: Secondary | ICD-10-CM

## 2022-02-02 MED ORDER — CLONAZEPAM 0.5 MG PO TABS
ORAL_TABLET | ORAL | 1 refills | Status: DC
Start: 2022-02-02 — End: 2022-09-03

## 2022-02-02 MED ORDER — CLONAZEPAM 0.5 MG PO TABS: ORAL_TABLET | ORAL | 1 refills | Status: DC

## 2022-02-02 NOTE — Telephone Encounter (Signed)
Refill Klonopin 0.5 mg x 6 months. MJB, MD

## 2022-02-02 NOTE — Telephone Encounter (Signed)
Received Fax RX request from  [place], [place] Phone: [phone]  Fax: [phone]      Medication - clonazePAM (KLONOPIN) 0.5 MG tablet   Last Refill - 07/27/2021  Last OV - 12/22/21  Last CPE - 04/13/21  Next Appointment - 04/22/22

## 2022-03-28 ENCOUNTER — Other Ambulatory Visit: Payer: Self-pay | Admitting: Internal Medicine

## 2022-04-21 ENCOUNTER — Other Ambulatory Visit: Payer: Medicare PPO

## 2022-04-21 DIAGNOSIS — E119 Type 2 diabetes mellitus without complications: Secondary | ICD-10-CM | POA: Diagnosis not present

## 2022-04-21 DIAGNOSIS — R5383 Other fatigue: Secondary | ICD-10-CM

## 2022-04-21 DIAGNOSIS — I10 Essential (primary) hypertension: Secondary | ICD-10-CM | POA: Diagnosis not present

## 2022-04-21 DIAGNOSIS — E785 Hyperlipidemia, unspecified: Secondary | ICD-10-CM | POA: Diagnosis not present

## 2022-04-21 DIAGNOSIS — E1169 Type 2 diabetes mellitus with other specified complication: Secondary | ICD-10-CM

## 2022-04-22 ENCOUNTER — Encounter: Payer: Self-pay | Admitting: Internal Medicine

## 2022-04-22 ENCOUNTER — Ambulatory Visit (INDEPENDENT_AMBULATORY_CARE_PROVIDER_SITE_OTHER): Payer: Medicare PPO | Admitting: Internal Medicine

## 2022-04-22 VITALS — BP 128/66 | HR 71 | Temp 98.1°F | Ht 61.75 in | Wt 178.0 lb

## 2022-04-22 DIAGNOSIS — M1711 Unilateral primary osteoarthritis, right knee: Secondary | ICD-10-CM

## 2022-04-22 DIAGNOSIS — Z6832 Body mass index (BMI) 32.0-32.9, adult: Secondary | ICD-10-CM | POA: Diagnosis not present

## 2022-04-22 DIAGNOSIS — M7918 Myalgia, other site: Secondary | ICD-10-CM | POA: Diagnosis not present

## 2022-04-22 DIAGNOSIS — Z8659 Personal history of other mental and behavioral disorders: Secondary | ICD-10-CM | POA: Diagnosis not present

## 2022-04-22 DIAGNOSIS — E119 Type 2 diabetes mellitus without complications: Secondary | ICD-10-CM | POA: Diagnosis not present

## 2022-04-22 DIAGNOSIS — E1169 Type 2 diabetes mellitus with other specified complication: Secondary | ICD-10-CM | POA: Diagnosis not present

## 2022-04-22 DIAGNOSIS — I10 Essential (primary) hypertension: Secondary | ICD-10-CM | POA: Diagnosis not present

## 2022-04-22 DIAGNOSIS — K219 Gastro-esophageal reflux disease without esophagitis: Secondary | ICD-10-CM | POA: Diagnosis not present

## 2022-04-22 DIAGNOSIS — Z0000 Encounter for general adult medical examination without abnormal findings: Secondary | ICD-10-CM | POA: Diagnosis not present

## 2022-04-22 DIAGNOSIS — E785 Hyperlipidemia, unspecified: Secondary | ICD-10-CM

## 2022-04-22 LAB — LIPID PANEL
Cholesterol: 188 mg/dL (ref <200)
HDL: 71 mg/dL (ref >=50)
LDL Cholesterol (Calc): 100 mg/dL (calc) — ABNORMAL HIGH
Non-HDL Cholesterol (Calc): 117 mg/dL (calc) (ref <130)
Total CHOL/HDL Ratio: 2.6 (calc) (ref <5.0)
Triglycerides: 76 mg/dL (ref <150)

## 2022-04-22 LAB — POCT URINALYSIS DIPSTICK
Bilirubin, UA: NEGATIVE
Blood, UA: NEGATIVE
Glucose, UA: NEGATIVE
Ketones, UA: NEGATIVE
Leukocytes, UA: NEGATIVE
Nitrite, UA: NEGATIVE
Protein, UA: NEGATIVE
Spec Grav, UA: 1.02 (ref 1.010–1.025)
Urobilinogen, UA: 0.2 E.U./dL
pH, UA: 5 (ref 5.0–8.0)

## 2022-04-22 LAB — CBC WITH DIFFERENTIAL/PLATELET
Absolute Monocytes: 458 cells/uL (ref 200–950)
Basophils Absolute: 42 cells/uL (ref 0–200)
Basophils Relative: 0.8 %
Eosinophils Absolute: 78 cells/uL (ref 15–500)
Eosinophils Relative: 1.5 %
HCT: 33.3 % — ABNORMAL LOW (ref 35.0–45.0)
Hemoglobin: 11.1 g/dL — ABNORMAL LOW (ref 11.7–15.5)
Lymphs Abs: 2080 cells/uL (ref 850–3900)
MCH: 30.1 pg (ref 27.0–33.0)
MCHC: 33.3 g/dL (ref 32.0–36.0)
MCV: 90.2 fL (ref 80.0–100.0)
MPV: 9.7 fL (ref 7.5–12.5)
Monocytes Relative: 8.8 %
Neutro Abs: 2543 cells/uL (ref 1500–7800)
Neutrophils Relative %: 48.9 %
Platelets: 268 10*3/uL (ref 140–400)
RBC: 3.69 10*6/uL — ABNORMAL LOW (ref 3.80–5.10)
RDW: 12.3 % (ref 11.0–15.0)
Total Lymphocyte: 40 %
WBC: 5.2 10*3/uL (ref 3.8–10.8)

## 2022-04-22 LAB — COMPLETE METABOLIC PANEL WITH GFR
AG Ratio: 1.8 (calc) (ref 1.0–2.5)
ALT: 12 U/L (ref 6–29)
AST: 15 U/L (ref 10–35)
Albumin: 4.4 g/dL (ref 3.6–5.1)
Alkaline phosphatase (APISO): 55 U/L (ref 37–153)
BUN: 25 mg/dL (ref 7–25)
CO2: 32 mmol/L (ref 20–32)
Calcium: 9.2 mg/dL (ref 8.6–10.4)
Chloride: 103 mmol/L (ref 98–110)
Creat: 0.91 mg/dL (ref 0.60–1.00)
Globulin: 2.4 g/dL (calc) (ref 1.9–3.7)
Glucose, Bld: 72 mg/dL (ref 65–99)
Potassium: 4.4 mmol/L (ref 3.5–5.3)
Sodium: 143 mmol/L (ref 135–146)
Total Bilirubin: 0.5 mg/dL (ref 0.2–1.2)
Total Protein: 6.8 g/dL (ref 6.1–8.1)
eGFR: 67 mL/min/{1.73_m2} (ref >=60)

## 2022-04-22 LAB — HEMOGLOBIN A1C
Hgb A1c MFr Bld: 6.7 % of total Hgb — ABNORMAL HIGH (ref <5.7)
Mean Plasma Glucose: 146 mg/dL
eAG (mmol/L): 8.1 mmol/L

## 2022-04-22 LAB — MICROALBUMIN / CREATININE URINE RATIO
Creatinine, Urine: 166 mg/dL (ref 20–275)
Microalb Creat Ratio: 8 mcg/mg creat (ref <30)
Microalb, Ur: 1.3 mg/dL

## 2022-04-22 LAB — TSH: TSH: 1.43 mIU/L (ref 0.40–4.50)

## 2022-04-22 NOTE — Progress Notes (Signed)
Annual Wellness Visit     Patient: Carla Oconnor, Female    DOB: [birth year], 72 y.o.   MRN: [id number] Visit Date: 04/22/2022  Chief Complaint  Patient presents with   Annual Exam   Subjective    Carla Oconnor is a 72 y.o. female who presents today for her Annual Wellness Visit.  HPI She also presents for health maintenance exam and evaluation of medical issues.  She has a history of type 2 diabetes mellitus, obesity, hypertension, hyperlipidemia, GE reflux.   Musculoskeletal pain is treated with Mobic.  Remote history of asthma.  Has albuterol inhaler on hand.  History of hypokalemia secondary to diuretic therapy.  She had coronary calcium scoring in March 2019 and her score was 3.  She saw Dr. Nolon Lennert, cardiologist January 2022 and he felt her hypertension was well-controlled.  She has had a normal cardiac catheterization in the remote past.  Issues with osteoarthritis of right knee for which she has seen Dr. Ninfa Linden.  She had aspiration of knee joint effusion October 2021 and an injection of Hyaluronan in October 2021.  Also has had right knee injected with lidocaine and methylprednisolone.  History of anxiety treated with Klonopin-mainly at bedtime when she has difficulty falling asleep.  Anxiety-worried because sister has been found to have plasmacytoma of the right humerus.  Patient had x-ray of right humerus due to pain in December 2022 which was normal.  Her hemoglobin A1c is 6.7%, CBC is stable, fasting glucose is 72 and BUN and creatinine are normal.  Electrolytes are normal as are liver functions.  Lipid panel is near normal is LDL is 100.  TSH is normal.  Past medical history: Left lateral epicondylitis 2013, carpal tunnel release right hand October 2013.  Cesarean sections 1975 and 1977.  Benign right breast biopsies in 1966 and 1976.  Right trigger finger release September 2009.  Right third trigger finger treated July 2007.  She is status post hysterectomy  with right oophorectomy and right salpingectomy in 2003.  Patient says she had hepatitis C exposure through her husband but her hepatitis C antibody was negative when checked in 2017.  Social history: She is married.  She has a Scientist, water quality.  She is self-employed and case management.  She has a set of twins (boy and girl (and another son.  She lost a nephew during the Summer 2019 in a single car accident on [employer] in bad weather.  Husband is a Psychiatric nurse and a [place] veteran.  Family history: Father died at age 86 with a stroke.  Mother with history of diabetes and hypertension.  1 brother in fairly good health but has had trouble with crack addiction.  4 sisters (2 full sisters and 2 half sisters).  1 sister died of kidney failure with history of breast cancer.  1 sister with history of diabetes and coronary artery disease      Review of Systems has anxiety about her health      Vitals: Reviewed.  BMI 32.82 weight 178 pounds.  Blood pressure 128/66 pulse 71 regular  Physical Exam Skin: Warm and dry.  No cervical adenopathy, thyromegaly or carotid bruits.  Chest clear.  Cardiac exam: Regular rate and rhythm without ectopy.  Breast are without masses.  Abdomen is soft nondistended without hepatosplenomegaly masses or tenderness.  No lower extremity pitting edema.  Bimanual normal.  Pap deferred due to age.  Brief neurological exam is intact without gross focal deficits.  Affect, thought and judgment are normal.    Most recent fall risk assessment:    04/22/2022   10:54 AM  Fall Risk   Falls in the past year? 0  Number falls in past yr: 0  Injury with Fall? 0  Risk for fall due to : No Fall Risks  Follow up Falls prevention discussed    Most recent depression screenings:    04/22/2022   10:54 AM 04/13/2021   11:26 AM  PHQ 2/9 Scores  PHQ - 2 Score 0 0   Most recent cognitive screening:    04/13/2021   11:27 AM  6CIT Screen  What Year? 0 points  What month?  0 points  What time? 0 points  Count back from 20 0 points  Months in reverse 0 points  Repeat phrase 0 points  Total Score 0 points       Assessment & Plan  Right tennis elbow-saw Orthopedist about a month ago for an injection- may return for another injection Vaccines discussed Mammogram due in June Colonoscopy due 2028 Right knee osteoarthritis Essential hypertension-stable on current regimen Hyperlipidemia treated with Crestor 20 mg daily and lipid panel is stable History of hypokalemia treated with potassium supplement Type 2 diabetes mellitus treated conservatively and hemoglobin A1c is 6.7%.  In June 2023 hemoglobin A1c was 6.2%.  Needs to work on diet exercise and weight loss.  She is on metformin.  She is on glipizide.  Reevaluate in 6 months. History of asthma treated with albuterol inhaler History of GE reflux treated with PPI  Plan:         Annual wellness visit done today including the all of the following: Reviewed patient's Family Medical History Reviewed and updated list of patient's medical providers Assessment of cognitive impairment was done Assessed patient's functional ability Established a written schedule for health screening [place] Completed and Reviewed  Discussed health benefits of physical activity, and encouraged her to engage in regular exercise appropriate for her age and condition.    Vaccines discussed. Getting RSV vaccine this coming week. Will be getting covid booster soon. Has mammogram in February.     {I, Elby Showers, MD, have reviewed all documentation for this visit. The documentation on 04/22/22 for the exam, diagnosis, procedures, and orders are all accurate and complete.IElby Showers, MD, have reviewed all documentation for this visit. The documentation on 04/22/22 for the exam, diagnosis, procedures, and orders are all accurate and complete.   LaVon Barron Alvine, CMA

## 2022-04-22 NOTE — Patient Instructions (Addendum)
It was a pleasure to see you today.  Please work on diet exercise and weight loss and follow-up here in 6 months.  Continue current medications.

## 2022-05-18 ENCOUNTER — Other Ambulatory Visit: Payer: Self-pay | Admitting: Internal Medicine

## 2022-05-18 DIAGNOSIS — Z1231 Encounter for screening mammogram for malignant neoplasm of breast: Secondary | ICD-10-CM

## 2022-05-30 ENCOUNTER — Other Ambulatory Visit: Payer: Self-pay | Admitting: Internal Medicine

## 2022-06-20 ENCOUNTER — Other Ambulatory Visit: Payer: Self-pay

## 2022-06-22 ENCOUNTER — Ambulatory Visit
Admission: RE | Admit: 2022-06-22 | Discharge: 2022-06-22 | Disposition: A | Discharge status: Home / Self Care | Payer: Medicare PPO | Source: Ambulatory Visit | Attending: Internal Medicine | Admitting: Internal Medicine

## 2022-06-22 DIAGNOSIS — Z1231 Encounter for screening mammogram for malignant neoplasm of breast: Secondary | ICD-10-CM | POA: Diagnosis not present

## 2022-06-23 ENCOUNTER — Encounter: Payer: Self-pay | Admitting: Radiology

## 2022-07-07 ENCOUNTER — Other Ambulatory Visit: Payer: Self-pay | Admitting: Internal Medicine

## 2022-07-30 ENCOUNTER — Other Ambulatory Visit: Payer: Self-pay | Admitting: Internal Medicine

## 2022-09-03 ENCOUNTER — Other Ambulatory Visit: Payer: Self-pay | Admitting: Internal Medicine

## 2022-09-20 ENCOUNTER — Telehealth: Payer: Self-pay

## 2022-09-20 ENCOUNTER — Telehealth (INDEPENDENT_AMBULATORY_CARE_PROVIDER_SITE_OTHER): Payer: Medicare PPO | Admitting: Internal Medicine

## 2022-09-20 VITALS — Temp 98.5°F

## 2022-09-20 DIAGNOSIS — J22 Unspecified acute lower respiratory infection: Secondary | ICD-10-CM

## 2022-09-20 MED ORDER — HYDROCODONE BIT-HOMATROP MBR 5-1.5 MG/5ML PO SOLN: 5.0000 mL | Freq: Three times a day (TID) | ORAL | 0 refills | Status: DC | PRN

## 2022-09-20 MED ORDER — AZITHROMYCIN 250 MG PO TABS: ORAL_TABLET | ORAL | 0 refills | Status: AC

## 2022-09-20 NOTE — Telephone Encounter (Signed)
Patient states that she took covid test and it was negative. She is asking for a virtual If possible. No SOB or Wheezing.

## 2022-09-20 NOTE — Telephone Encounter (Signed)
Results have been relayed to the patient/authorized caretaker. The patient/authorized caretaker verbalized understanding. She will take test and call us back with results.

## 2022-09-20 NOTE — Telephone Encounter (Signed)
Patient states that her throat started hurting yesterday morning. Since then she has cough, body aches, bilateral ear pain, congestion. No fever. She has not taken a covid test. Would like appt and is ok if you want to do video visit. Please advise.

## 2022-09-20 NOTE — Telephone Encounter (Signed)
Appt made for today 430.

## 2022-09-20 NOTE — Progress Notes (Signed)
Patient Care Team: [name], MD as PCP - General Wendall Stade, MD as PCP - Cardiology (Cardiology)  I connected with [name] on 09/20/22 at 4:29 PM by video enabled telemedicine visit and verified that I am speaking with the correct person using two identifiers.   I discussed the limitations, risks, security and privacy concerns of performing an evaluation and management service by telemedicine and the availability of in-person appointments. I also discussed with the patient that there may be a patient responsible charge related to this service. The patient expressed understanding and agreed to proceed.   Other persons participating in the visit and their role in the encounter: Medical scribe, Doylene Bode  Patient's location: Home  Provider's location: My office  I provided 20 minutes of face-to-face video visit time during this encounter, and > 50% was spent counseling as documented under my assessment & plan.  Chart review also done.  She is identified using two identifiers, as Jeeya Deruyter, a patient of this practice. She is in her home and I am in my office. She is agreeable to visit in this format today.  Chief Complaint: cough, sore throat   Subjective:    Patient ID: Carla Oconnor , Female    DOB: 04-19-1950, 72 y.o.    MRN: [id number]   72 y.o. Female presents today for sore throat, cough with yellow sputum, yellow nasal discharge, headache, congestion, malaise, ear pain since 09/20/22. Also having pain in left rib cage. Denies fever. Taking Mucinex, ibuprofen. Reports negative Covid-19 test. Denies sick contact, neck stiffness. Went to [place] recently and dined out at [employer].  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  History of hypertension and hyperlipidemia.  History of GE reflux.  History of reactive airways disease, type 2 diabetes mellitus.  Negative Cardiolite study in 2014.  Past Medical History:   Diagnosis Date   Asthma    as child and seasonal   Diabetes mellitus without complication (HCC)    Fibrocystic breast    GERD (gastroesophageal reflux disease)    Hyperlipidemia    Hypertension    MRSA infection 2008   right arm   Neuromuscular disorder (HCC)    tennis elbow on right and left arm   S/P hysterectomy      Family History  Problem Relation Age of Onset   Stroke Father    Breast cancer Sister    Cancer Sister        in her shoulder   Asthma Other    Hypertension Other    Cancer Other    Diabetes Other    Hyperlipidemia Other     Social History   Social History Narrative   Social history: 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  She has one set of twins, boy and girl and also another son.  She lost her nephew during the summer 2019 and a single car accident [address].  Bad weather.  Her husband is a English as a second language teacher and a [place] veteran.       In the spring 2014 she saw cardiologist regarding chest pain.  She had a negative Cardiolite study.  She had previous Cardiolite study in 2009 that was negative.  She had a cardiac cath with Dr. Aleen Campi in 2003.       Family history: Father died at age 41 with stroke.  Mother with history  of diabetes and hypertension.  1 brother in fairly good health but has had trouble with crack addiction.  4 sisters.  She has 2 full sisters and 2 half sisters.  1 sister died of kidney failure with history of breast cancer.  1 sister with history of diabetes and coronary disease.      Review of Systems  Constitutional:  Positive for malaise/fatigue. Negative for fever.  HENT:  Positive for congestion, ear pain and sore throat.        (+) Yellow nasal discharge  Eyes:  Negative for blurred vision.  Respiratory:  Positive for cough and sputum production (Yellow). Negative for shortness of breath.   Cardiovascular:  Negative for chest pain, palpitations and leg swelling.   Gastrointestinal:  Negative for vomiting.  Musculoskeletal:  Negative for back pain.  Skin:  Negative for rash.  Neurological:  Negative for loss of consciousness and headaches.        Objective:   Vitals: Temp 98.5 F (36.9 C) (Tympanic)    Physical Exam Vitals and nursing note reviewed.  Constitutional:      General: She is not in acute distress.    Appearance: Normal appearance. She is not toxic-appearing.     Comments: Appears to have malaise/fatigue.  HENT:     Head: Normocephalic and atraumatic.  Pulmonary:     Effort: Pulmonary effort is normal.     Comments: Not tachypneic. Skin:    General: Skin is warm and dry.  Neurological:     Mental Status: She is alert and oriented to person, place, and time. Mental status is at baseline.  Psychiatric:        Mood and Affect: Mood normal.        Behavior: Behavior normal.        Thought Content: Thought content normal.        Judgment: Judgment normal.       Results:   Studies obtained and personally reviewed by me:   Labs:       Component Value Date/Time   NA 143 04/21/2022 0941   K 4.4 04/21/2022 0941   CL 103 04/21/2022 0941   CO2 32 04/21/2022 0941   GLUCOSE 72 04/21/2022 0941   BUN 25 04/21/2022 0941   CREATININE 0.91 04/21/2022 0941   CALCIUM 9.2 04/21/2022 0941   PROT 6.8 04/21/2022 0941   ALBUMIN 4.3 09/22/2016 1012   AST 15 04/21/2022 0941   ALT 12 04/21/2022 0941   ALKPHOS 62 09/22/2016 1012   BILITOT 0.5 04/21/2022 0941   GFRNONAA 52 (L) 10/06/2020 0929   GFRAA 60 10/06/2020 0929     Lab Results  Component Value Date   WBC 5.2 04/21/2022   HGB 11.1 (L) 04/21/2022   HCT 33.3 (L) 04/21/2022   MCV 90.2 04/21/2022   PLT 268 04/21/2022    Lab Results  Component Value Date   CHOL 188 04/21/2022   HDL 71 04/21/2022   LDLCALC 100 (H) 04/21/2022   TRIG 76 04/21/2022   CHOLHDL 2.6 04/21/2022    Lab Results  Component Value Date   HGBA1C 6.7 (H) 04/21/2022     Lab Results   Component Value Date   TSH 1.43 04/21/2022    Assessment & Plan:   Acute upper respiratory infection: prescribed Zithromax Z-pak two tabs day 1 followed by one tab days 2-5, Hycodan syrup 1 tsp every 8 hours as needed for cough. Contact us if symptoms do not improve by tomorrow morning at which point  we will consider CXR.  Time spent with chart review, interviewing patient, E scribing medications and medical decision making is 20 minutes.  I,Alexander Ruley,acting as a Neurosurgeon for [name], MD.,have documented all relevant documentation on the behalf of [name], MD,as directed by  [name], MD while in the presence of [name], MD.   I, [name], MD, have reviewed all documentation for this visit. The documentation on 09/21/22 for the exam, diagnosis, procedures, and orders are all accurate and complete.

## 2022-09-21 ENCOUNTER — Other Ambulatory Visit: Payer: Self-pay | Admitting: Internal Medicine

## 2022-09-21 ENCOUNTER — Ambulatory Visit (INDEPENDENT_AMBULATORY_CARE_PROVIDER_SITE_OTHER): Payer: Medicare PPO | Admitting: Internal Medicine

## 2022-09-21 ENCOUNTER — Encounter: Payer: Self-pay | Admitting: Internal Medicine

## 2022-09-21 ENCOUNTER — Ambulatory Visit
Admission: RE | Admit: 2022-09-21 | Discharge: 2022-09-21 | Disposition: A | Discharge status: Home / Self Care | Payer: Medicare PPO | Source: Ambulatory Visit | Attending: Internal Medicine | Admitting: Internal Medicine

## 2022-09-21 ENCOUNTER — Telehealth: Payer: Self-pay | Admitting: Internal Medicine

## 2022-09-21 DIAGNOSIS — E1169 Type 2 diabetes mellitus with other specified complication: Secondary | ICD-10-CM

## 2022-09-21 DIAGNOSIS — R059 Cough, unspecified: Secondary | ICD-10-CM | POA: Diagnosis not present

## 2022-09-21 DIAGNOSIS — J9801 Acute bronchospasm: Secondary | ICD-10-CM

## 2022-09-21 DIAGNOSIS — E785 Hyperlipidemia, unspecified: Secondary | ICD-10-CM | POA: Diagnosis not present

## 2022-09-21 DIAGNOSIS — Z8579 Personal history of other malignant neoplasms of lymphoid, hematopoietic and related tissues: Secondary | ICD-10-CM

## 2022-09-21 DIAGNOSIS — R822 Biliuria: Secondary | ICD-10-CM

## 2022-09-21 DIAGNOSIS — R0789 Other chest pain: Secondary | ICD-10-CM | POA: Diagnosis not present

## 2022-09-21 DIAGNOSIS — J22 Unspecified acute lower respiratory infection: Secondary | ICD-10-CM

## 2022-09-21 DIAGNOSIS — R051 Acute cough: Secondary | ICD-10-CM

## 2022-09-21 DIAGNOSIS — R062 Wheezing: Secondary | ICD-10-CM | POA: Diagnosis not present

## 2022-09-21 NOTE — Telephone Encounter (Signed)
Patient has called. Coughing , wheezing and up all night. Started Z-pak yesterday. Has Hycodan for cough. Advised OV . Had virtual visit yesterday. MJB, MD CXR ordered.

## 2022-09-21 NOTE — Progress Notes (Signed)
   Subjective:    Patient ID: Carla Oconnor, female    DOB: March 17, 1951, 72 y.o.   MRN: [id number]  HPI Patient called today requesting visit.  Was seen virtually yesterday with an acute respiratory infection.  Patient [place] the beach recently and subsequently developed respiratory infection symptoms on June 4 including discolored sputum production, discolored nasal drainage, headache, malaise, and ear pain.  Also was having left rib cage pain.  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  History of hypertension and hyperlipidemia.  History of GE reflux.  History of reactive airways disease and type 2 diabetes mellitus.  Yesterday was placed on Zithromax Z-PAK and prescribed Hycodan 1 teaspoon every 8 hours as needed for pain.  Despite having Hycodan she is complaining of left lateral lower rib cage pain.  No hemoptysis.    Review of Systems see above no nausea or vomiting     Objective:   Physical Exam She is seen in exam room and is alert and oriented x 3.  She is not tachypneic.  She looks a bit fatigued.  Left TM is full but not red.  Right TM slightly full.  Pharynx clear.  Neck is supple without adenopathy.  Chest is clear.  Chest x-ray shows no pneumonia.  She does have some palpable left lateral lower rib cage pain which I think is probably due to coughing.  She is afebrile and her pulse oximetry is 94% on room air.  Respiratory rate is normal.     Assessment & Plan:   Acute lower respiratory infection.  She is not tachypneic and has no hemoptysis.  Chest x-ray is negative.  History of plasmacytoma seen by Oncology  Acute musculoskeletal pain left lower rib cage area  Essential hypertension  Hyperlipidemia associated with type 2 diabetes both of which are stable   Plan: Hycodan can be continued 1 teaspoon every 6-8 hours as needed for cough and pain.  Continue Zithromax Z-PAK as prescribed yesterday.  Reassured her chest x-ray does not show  pneumonia.

## 2022-09-21 NOTE — Patient Instructions (Signed)
Take Zithromax Z-PAK 2 tabs day 1 followed by 1 tab days 2 through 5.  Take Hycodan syrup 1 teaspoon every 8 hours as needed for cough.  She does have an inhaler.  Contact us tomorrow if symptoms not improving at which point we will consider chest x-ray.

## 2022-09-21 NOTE — Patient Instructions (Signed)
Continue Zithromax Z-PAK as prescribed yesterday.  Continue Hycodan as needed for cough.  I feel you have left lower rib cage pain secondary to coughing.  Chest x-ray is negative.  Hycodan should help treat the chest wall pain as well as the cough.  Take Tylenol if needed.  Rest and stay well-hydrated.  Your pulse oximetry is normal.

## 2022-10-06 ENCOUNTER — Encounter: Payer: Self-pay | Admitting: Internal Medicine

## 2022-10-13 NOTE — Progress Notes (Signed)
Patient Care Team: [name], MD as PCP - General Wendall Stade, MD as PCP - Cardiology (Cardiology) Jethro Bolus, MD as Consulting Physician (Ophthalmology) Jethro Bolus, MD as Consulting Physician (Ophthalmology)  Visit Date: 10/27/22  Subjective:    Patient ID: Carla Oconnor , Female   DOB: 1950/06/21, 72 y.o.    MRN: [id number]   72 y.o. Female presents today for a 6 month follow-up.  Has been having right lower back pain. She goes to [facility] for assisted stretching, which is helping. Requesting ibuprofen refill.  History of Type 2 diabetes mellitus treated with metformin 1000 mg twice daily with meal, glipizide 10 mg daily with breakfast. HGBA1c at 6.2% on 10/24/22, down from 6.7% on 04/21/22.  History of GERD treated with omeprazole 20 mg daily.  History of hyperlipidemia treated with rosuvastatin 10 mg daily. Lipid panel normal.  History of hypertension treated with valsartan-hydrochlorothiazide 160-25 mg daily. Blood pressure normal today at 138/88.  History of musculoskeletal pain treated with meloxicam 15 mg daily.  Dr. Dione Booze, her ophthalmologist, has retired. Suggested [facility].  Liver functions normal. Family history of kidney disease in sister  Past medical history: Left lateral epicondylitis 2013, carpal tunnel release right hand October 2013. Cesarean sections 1975 and 1977. Benign right breast biopsies in 1966 and 1976. Right trigger finger release September 2009. Right third trigger finger treated July 2007. She is status post hysterectomy with right oophorectomy and right salpingectomy in 2003. Patient says she had hepatitis C exposure through her husband but her hepatitis C antibody was negative when checked in 2017.    Past Medical History:  Diagnosis Date   Asthma    as child and seasonal   Diabetes mellitus without complication (HCC)    Fibrocystic breast    GERD (gastroesophageal reflux disease)    Hyperlipidemia     Hypertension    MRSA infection 2008   right arm   Neuromuscular disorder (HCC)    tennis elbow on right and left arm   S/P hysterectomy      Family History  Problem Relation Age of Onset   Stroke Father    Breast cancer Sister    Cancer Sister        in her shoulder   Asthma Other    Hypertension Other    Cancer Other    Diabetes Other    Hyperlipidemia Other     Social History   Social History Narrative   Social history: 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  She has one set of twins, boy and girl and also another son.  She lost her nephew during the summer 2019 and a single car accident [address].  Bad weather.  Her husband is a English as a second language teacher and a [place] veteran.       In the spring 2014 she saw cardiologist regarding chest pain.  She had a negative Cardiolite study.  She had previous Cardiolite study in 2009 that was negative.  She had a cardiac cath with Dr. Aleen Campi in 2003.       Family history: Father died at age 39 with stroke.  Mother with history of diabetes and hypertension.  1 brother in fairly good health but has had trouble with crack addiction.  4 sisters.  She has 2 full sisters and 2 half sisters.  1 sister died of kidney failure with history of breast cancer.  1 sister  with history of diabetes and coronary disease.      Review of Systems  Constitutional:  Negative for fever and malaise/fatigue.  HENT:  Negative for congestion.   Eyes:  Negative for blurred vision.  Respiratory:  Negative for cough and shortness of breath.   Cardiovascular:  Negative for chest pain, palpitations and leg swelling.  Gastrointestinal:  Negative for vomiting.  Musculoskeletal:  Positive for back pain (Right lower).  Skin:  Negative for rash.  Neurological:  Negative for loss of consciousness and headaches.        Objective:   Vitals: BP 138/88   Pulse 69   Resp 16   Ht 5' 1.75" (1.568 m)   Wt 176 lb 12  oz (80.2 kg)   SpO2 98%   BMI 32.59 kg/m    Physical Exam Vitals and nursing note reviewed.  Constitutional:      General: She is not in acute distress.    Appearance: Normal appearance. She is not toxic-appearing.  HENT:     Head: Normocephalic and atraumatic.  Cardiovascular:     Rate and Rhythm: Normal rate and regular rhythm. No extrasystoles are present.    Pulses: Normal pulses.     Heart sounds: Normal heart sounds. No murmur heard.    No friction rub. No gallop.  Pulmonary:     Effort: Pulmonary effort is normal. No respiratory distress.     Breath sounds: Normal breath sounds. No wheezing or rales.  Skin:    General: Skin is warm and dry.  Neurological:     Mental Status: She is alert and oriented to person, place, and time. Mental status is at baseline.  Psychiatric:        Mood and Affect: Mood normal.        Behavior: Behavior normal.        Thought Content: Thought content normal.        Judgment: Judgment normal.       Results:   Studies obtained and personally reviewed by me:   Labs:       Component Value Date/Time   NA 143 04/21/2022 0941   K 4.4 04/21/2022 0941   CL 103 04/21/2022 0941   CO2 32 04/21/2022 0941   GLUCOSE 72 04/21/2022 0941   BUN 25 04/21/2022 0941   CREATININE 0.91 04/21/2022 0941   CALCIUM 9.2 04/21/2022 0941   PROT 6.8 10/24/2022 0942   ALBUMIN 4.3 09/22/2016 1012   AST 19 10/24/2022 0942   ALT 14 10/24/2022 0942   ALKPHOS 62 09/22/2016 1012   BILITOT 0.7 10/24/2022 0942   GFRNONAA 52 (L) 10/06/2020 0929   GFRAA 60 10/06/2020 0929     Lab Results  Component Value Date   WBC 5.2 04/21/2022   HGB 11.1 (L) 04/21/2022   HCT 33.3 (L) 04/21/2022   MCV 90.2 04/21/2022   PLT 268 04/21/2022    Lab Results  Component Value Date   CHOL 178 10/24/2022   HDL 61 10/24/2022   LDLCALC 96 10/24/2022   TRIG 113 10/24/2022   CHOLHDL 2.9 10/24/2022    Lab Results  Component Value Date   HGBA1C 6.2 (H) 10/24/2022      Lab Results  Component Value Date   TSH 1.43 04/21/2022      Assessment & Plan:   Type 2 diabetes mellitus: treated with metformin 1000 mg twice daily with meal, glipizide 10 mg daily with breakfast. HGBA1c at 6.2% on 10/24/22, down from 6.7% on 04/21/22.  GERD:  treated with omeprazole 20 mg daily.  Hyperlipidemia: treated with rosuvastatin 10 mg daily. Lipid panel normal.  Hypertension: treated with valsartan-hydrochlorothiazide 160-25 mg daily. Blood pressure normal today at 138/88.  Musculoskeletal pain, right-sided sciatica: treated with Meloxicam 15 mg daily.  She prefers Advil over Meloxicam.  Advil 800 mg was refilled today #90 with 1 refill May take every 8 hours as needed.  Given Zithromax Z-Pak for upcoming travel to [place].  Anxiety/insomnia treated with Klonopin at bedtime and stable.  Medication refilled in May.  Osteoarthritis right knee for which she is seeing Dr. Magnus Ivan in October 2021 and had injection of hyaluronic acid and also had methylprednisolone with lidocaine injection.  Vaccine counseling: advised on receiving pneumococcal 20, tetanus vaccines before traveling. Advised on Covid-19 booster this Fall.  Return in 6 months for Medicare wellness and health maintenance exam or as needed.  Do not mix ibuprofen with meloxicam.  Take one med or the other and stay with that medication.    I,Alexander Ruley,acting as a Neurosurgeon for [name], MD.,have documented all relevant documentation on the behalf of [name], MD,as directed by  [name], MD while in the presence of [name], MD.   I, [name], MD, have reviewed all documentation for this visit. The documentation on 11/13/22 for the exam, diagnosis, procedures, and orders are all accurate and complete.

## 2022-10-24 ENCOUNTER — Other Ambulatory Visit: Payer: Medicare PPO

## 2022-10-24 DIAGNOSIS — E119 Type 2 diabetes mellitus without complications: Secondary | ICD-10-CM | POA: Diagnosis not present

## 2022-10-24 DIAGNOSIS — E785 Hyperlipidemia, unspecified: Secondary | ICD-10-CM

## 2022-10-24 LAB — HEPATIC FUNCTION PANEL
ALT: 14 U/L (ref 6–29)
Albumin: 4.5 g/dL (ref 3.6–5.1)
Bilirubin, Direct: 0.2 mg/dL (ref 0.0–0.2)

## 2022-10-24 LAB — LIPID PANEL: Triglycerides: 113 mg/dL (ref <150)

## 2022-10-25 LAB — HEMOGLOBIN A1C
Hgb A1c MFr Bld: 6.2 % of total Hgb — ABNORMAL HIGH (ref <5.7)
Mean Plasma Glucose: 131 mg/dL
eAG (mmol/L): 7.3 mmol/L

## 2022-10-25 LAB — HEPATIC FUNCTION PANEL
AG Ratio: 2 (calc) (ref 1.0–2.5)
AST: 19 U/L (ref 10–35)
Alkaline phosphatase (APISO): 54 U/L (ref 37–153)
Globulin: 2.3 g/dL (calc) (ref 1.9–3.7)
Indirect Bilirubin: 0.5 mg/dL (calc) (ref 0.2–1.2)
Total Bilirubin: 0.7 mg/dL (ref 0.2–1.2)
Total Protein: 6.8 g/dL (ref 6.1–8.1)

## 2022-10-25 LAB — LIPID PANEL
Cholesterol: 178 mg/dL (ref <200)
HDL: 61 mg/dL (ref >=50)
LDL Cholesterol (Calc): 96 mg/dL (calc)
Non-HDL Cholesterol (Calc): 117 mg/dL (calc) (ref <130)
Total CHOL/HDL Ratio: 2.9 (calc) (ref <5.0)

## 2022-10-27 ENCOUNTER — Ambulatory Visit: Payer: Medicare PPO | Admitting: Internal Medicine

## 2022-10-27 ENCOUNTER — Encounter: Payer: Self-pay | Admitting: Internal Medicine

## 2022-10-27 VITALS — BP 138/88 | HR 69 | Resp 16 | Ht 61.75 in | Wt 176.8 lb

## 2022-10-27 DIAGNOSIS — R822 Biliuria: Secondary | ICD-10-CM | POA: Diagnosis not present

## 2022-10-27 DIAGNOSIS — M7918 Myalgia, other site: Secondary | ICD-10-CM | POA: Diagnosis not present

## 2022-10-27 DIAGNOSIS — Z6832 Body mass index (BMI) 32.0-32.9, adult: Secondary | ICD-10-CM

## 2022-10-27 DIAGNOSIS — E1169 Type 2 diabetes mellitus with other specified complication: Secondary | ICD-10-CM | POA: Diagnosis not present

## 2022-10-27 DIAGNOSIS — Z8659 Personal history of other mental and behavioral disorders: Secondary | ICD-10-CM

## 2022-10-27 DIAGNOSIS — E785 Hyperlipidemia, unspecified: Secondary | ICD-10-CM | POA: Diagnosis not present

## 2022-10-27 DIAGNOSIS — Z7184 Encounter for health counseling related to travel: Secondary | ICD-10-CM | POA: Diagnosis not present

## 2022-10-27 DIAGNOSIS — K219 Gastro-esophageal reflux disease without esophagitis: Secondary | ICD-10-CM

## 2022-10-27 MED ORDER — AZITHROMYCIN 250 MG PO TABS: ORAL_TABLET | ORAL | 0 refills | Status: AC

## 2022-10-27 MED ORDER — IBUPROFEN 800 MG PO TABS: 800.0000 mg | ORAL_TABLET | Freq: Three times a day (TID) | ORAL | 1 refills | Status: AC | PRN

## 2022-11-13 NOTE — Patient Instructions (Addendum)
It was a pleasure to see you today.  Hemoglobin A1c has improved to 6.2% down from 6.7% in January.  Lipid panel is normal.  Blood pressure is stable.  Given Z-Pak for upcoming trip to [place].  Advil filled at patient request.  Do not take Advil and meloxicam.  I prefer that you stay with 1 prescription medication or the other for musculoskeletal pain.  There is a risk of kidney damage if you are taking both at the same time.

## 2022-11-16 ENCOUNTER — Encounter (HOSPITAL_BASED_OUTPATIENT_CLINIC_OR_DEPARTMENT_OTHER): Payer: Self-pay | Admitting: Emergency Medicine

## 2022-11-16 ENCOUNTER — Emergency Department (HOSPITAL_BASED_OUTPATIENT_CLINIC_OR_DEPARTMENT_OTHER)
Admission: EM | Admit: 2022-11-16 | Discharge: 2022-11-16 | Disposition: A | Discharge status: Home / Self Care | Payer: Medicare PPO | Attending: Emergency Medicine | Admitting: Emergency Medicine

## 2022-11-16 ENCOUNTER — Other Ambulatory Visit: Payer: Self-pay

## 2022-11-16 DIAGNOSIS — Z7984 Long term (current) use of oral hypoglycemic drugs: Secondary | ICD-10-CM | POA: Diagnosis not present

## 2022-11-16 DIAGNOSIS — E119 Type 2 diabetes mellitus without complications: Secondary | ICD-10-CM | POA: Diagnosis not present

## 2022-11-16 DIAGNOSIS — U071 COVID-19: Secondary | ICD-10-CM

## 2022-11-16 DIAGNOSIS — Z7982 Long term (current) use of aspirin: Secondary | ICD-10-CM | POA: Diagnosis not present

## 2022-11-16 DIAGNOSIS — M791 Myalgia, unspecified site: Secondary | ICD-10-CM | POA: Diagnosis present

## 2022-11-16 LAB — SARS CORONAVIRUS 2 BY RT PCR: SARS Coronavirus 2 by RT PCR: POSITIVE — AB

## 2022-11-16 MED ORDER — PAXLOVID (300/100) 20 X 150 MG & 10 X 100MG PO TBPK: 3.0000 | ORAL_TABLET | Freq: Two times a day (BID) | ORAL | 0 refills | Status: AC

## 2022-11-16 MED ORDER — ONDANSETRON 4 MG PO TBDP: 4.0000 mg | ORAL_TABLET | Freq: Three times a day (TID) | ORAL | 0 refills | Status: DC | PRN

## 2022-11-16 NOTE — Discharge Instructions (Addendum)
Please read and follow all provided instructions.  Your diagnoses today include:  1. COVID-19     Tests performed today include: Vital signs. See below for your results today.  COVID test was positive  Medications prescribed:  Paxlovid  Zofran (ondansetron) - for nausea and vomiting  Take any prescribed medications only as directed. Treatment for your infection is aimed at treating the symptoms. There are no medications, such as antibiotics, that will cure your infection.   Home care instructions:  Follow any educational materials contained in this packet.   Your illness is contagious and can be spread to others, especially during the first 3 or 4 days. It cannot be cured by antibiotics or other medicines. Take basic precautions such as washing your hands often, covering your mouth when you cough or sneeze, and avoiding public places where you could spread your illness to others.   Please continue drinking plenty of fluids.  Use over-the-counter medicines as needed as directed on packaging for symptom relief.  You may also use ibuprofen or tylenol as directed on packaging for pain or fever.  Do not take multiple medicines containing Tylenol or acetaminophen to avoid taking too much of this medication.  If you are positive for Covid-19, you should isolate yourself and not be exposed to other people for 5 days after your symptoms began. If you are not feeling better at day 5, you need to isolate yourself for a total of 10 days. If you are feeling better by day 5, you should wear a mask properly, over your nose and mouth, at all times while around other people until 10 days after your symptoms started.   Follow-up instructions: Please follow-up with your primary care provider as needed for further evaluation of your symptoms if you are not feeling better.   Return instructions:  Please return to the Emergency Department if you experience worsening symptoms.  Return to the emergency  department if you have worsening shortness of breath breathing or increased work of breathing, persistent vomiting RETURN IMMEDIATELY IF you develop shortness of breath, confusion or altered mental status, a new rash, become dizzy, faint, or poorly responsive, or are unable to be cared for at home. Please return if you have persistent vomiting and cannot keep down fluids or develop a fever that is not controlled by tylenol or motrin.   Please return if you have any other emergent concerns.  Additional Information:  Your vital signs today were: BP (!) 152/75 (BP Location: Left Arm)   Pulse 85   Temp 99.4 F (37.4 C) (Oral)   Resp 16   Ht 5\' 2"  (1.575 m)   Wt 79.8 kg   SpO2 95%   BMI 32.19 kg/m  If your blood pressure (BP) was elevated above 135/85 this visit, please have this repeated by your doctor within one month. --------------

## 2022-11-16 NOTE — ED Triage Notes (Signed)
Pt c/o body aches, cough, HA, bil otalgia that started yesterday

## 2022-11-16 NOTE — ED Provider Notes (Signed)
[facility] Provider Note   CSN: [id number] Arrival date & time: 11/16/22  1944     History  Chief Complaint  Patient presents with   Generalized Body Aches    Carla Oconnor is a 72 y.o. female.  Patient with history of diabetes, no history of kidney problems, no history of COVID infection but has had vaccines --presents to the emergency department for flulike illness starting yesterday.  This included chills, body aches, sore throat, stuffed up ears.  She has not had any nausea, vomiting, diarrhea.  No chest pain or shortness of breath.  No history of pulmonary problems.  No known sick contacts.       Home Medications Prior to Admission medications   Medication Sig Start Date End Date Taking? Authorizing Provider  nirmatrelvir & ritonavir (PAXLOVID, 300/100,) 20 x 150 MG & 10 x 100MG  TBPK Take 3 tablets by mouth 2 (two) times daily for 5 days. 11/16/22 11/21/22 Yes Renne Crigler, PA-C  ondansetron (ZOFRAN-ODT) 4 MG disintegrating tablet Take 1 tablet (4 mg total) by mouth every 8 (eight) hours as needed for nausea or vomiting. 11/16/22  Yes Renne Crigler, PA-C  albuterol (PROVENTIL HFA;VENTOLIN HFA) 108 (90 Base) MCG/ACT inhaler Inhale 2 puffs into the lungs every 6 (six) hours as needed for wheezing or shortness of breath. 04/06/18   [name], MD  aspirin 81 MG tablet Take 81 mg by mouth 2 (two) times daily.     [provider]  BAYER CONTOUR TEST test strip USE AS DIRECTED 11/08/13   [name], MD  cholecalciferol (VITAMIN D) 1000 UNITS tablet Take 1,000 Units by mouth daily.     [provider]  clonazePAM (KLONOPIN) 0.5 MG tablet TAKE 1 TABLET(0.5 MG) BY MOUTH AT BEDTIME AS NEEDED FOR SLEEP 09/03/22   [name], MD  Cyanocobalamin (B-12 PO) Take by mouth.    [provider]  cyclobenzaprine (FLEXERIL) 10 MG tablet TAKE 1 TABLET(10 MG) BY MOUTH AT BEDTIME 07/31/22   [name], MD   Fingerstix Lancets MISC Check glucose 4-5 TIMES a day. DISPENSE Microlet Colored Lancets 11/19/19   [name], MD  glipiZIDE (GLUCOTROL XL) 10 MG 24 hr tablet TAKE 1 TABLET(10 MG) BY MOUTH DAILY WITH BREAKFAST 09/03/22   [name], MD  glucose blood (CONTOUR NEXT TEST) test strip Check glucose levels 4-5 times daily 08/26/19   [name], MD  glucose blood test strip Check glucose 4-5 a day. DISPENSE BAYER TEST STRIPS AND LANCETS 11/02/18   [name], MD  ibuprofen (ADVIL) 800 MG tablet Take 1 tablet (800 mg total) by mouth every 8 (eight) hours as needed. 10/27/22   [name], MD  meloxicam (MOBIC) 15 MG tablet Take 1 tablet (15 mg total) by mouth daily. 10/12/21   [name], MD  metFORMIN (GLUCOPHAGE) 1000 MG tablet TAKE 1 TABLET(1000 MG) BY MOUTH TWICE DAILY WITH A MEAL 05/30/22   [name], MD  Multiple Vitamin (MULTIVITAMIN WITH MINERALS) TABS tablet Take 1 tablet by mouth daily.    [provider]  omeprazole (PRILOSEC) 20 MG capsule TAKE 1 CAPSULE(20 MG) BY MOUTH DAILY 11/22/21   [name], MD  potassium chloride SA (KLOR-CON M) 20 MEQ tablet TAKE 2 TABLETS BY MOUTH EVERY DAY 05/24/21   [name], MD  rosuvastatin (CRESTOR) 10 MG tablet Take 1 tablet (10 mg total) by mouth daily. 12/22/21  [name], MD  valACYclovir (VALTREX) 500 MG tablet TAKE 1 TABLET(500 MG) BY MOUTH TWICE DAILY 07/08/22   [name], MD  valsartan-hydrochlorothiazide (DIOVAN-HCT) 160-25 MG tablet TAKE 1 TABLET BY MOUTH EVERY DAY 09/21/22   [name], MD      Allergies    Tramadol and Percocet [oxycodone-acetaminophen]    Review of Systems   Review of Systems  Physical Exam Updated Vital Signs BP (!) 152/75 (BP Location: Left Arm)   Pulse 85   Temp 99.4 F (37.4 C) (Oral)   Resp 16   Ht 5\' 2"  (1.575 m)   Wt 79.8 kg   SpO2 95%   BMI 32.19 kg/m  Physical Exam Vitals and nursing note reviewed.  Constitutional:      Appearance: She is well-developed.   HENT:     Head: Normocephalic and atraumatic.     Jaw: No trismus.     Right Ear: Tympanic membrane, ear canal and external ear normal.     Left Ear: Tympanic membrane, ear canal and external ear normal.     Nose: Congestion present. No mucosal edema or rhinorrhea.     Mouth/Throat:     Mouth: Mucous membranes are moist. Mucous membranes are not dry. No oral lesions.     Pharynx: Uvula midline. No oropharyngeal exudate, posterior oropharyngeal erythema or uvula swelling.     Tonsils: No tonsillar abscesses.  Eyes:     General:        Right eye: No discharge.        Left eye: No discharge.     Conjunctiva/sclera: Conjunctivae normal.  Cardiovascular:     Rate and Rhythm: Normal rate and regular rhythm.     Heart sounds: Normal heart sounds.  Pulmonary:     Effort: Pulmonary effort is normal. No respiratory distress.     Breath sounds: Normal breath sounds. No wheezing or rales.  Abdominal:     Palpations: Abdomen is soft.     Tenderness: There is no abdominal tenderness.  Musculoskeletal:     Cervical back: Normal range of motion and neck supple.  Lymphadenopathy:     Cervical: No cervical adenopathy.  Skin:    General: Skin is warm and dry.  Neurological:     Mental Status: She is alert.  Psychiatric:        Mood and Affect: Mood normal.     ED Results / Procedures / Treatments   Labs (all labs ordered are listed, but only abnormal results are displayed) Labs Reviewed  SARS CORONAVIRUS 2 BY RT PCR - Abnormal; Notable for the following components:      Result Value   SARS Coronavirus 2 by RT PCR POSITIVE (*)    All other components within normal limits    EKG None  Radiology No results found.  Procedures Procedures    Medications Ordered in ED Medications - No data to display  ED Course/ Medical Decision Making/ A&P    Patient seen and examined. History obtained directly from patient. Work-up including labs, imaging, EKG ordered in triage, if performed,  were reviewed.    Labs/EKG: Independently reviewed and interpreted.  This included: COVID testing, positive  Imaging: None ordered  Medications/Fluids: None ordered  Most recent vital signs reviewed and are as follows: BP (!) 152/75 (BP Location: Left Arm)   Pulse 85   Temp 99.4 F (37.4 C) (Oral)   Resp 16   Ht 5\' 2"  (1.575 m)   Wt 79.8 kg  SpO2 95%   BMI 32.19 kg/m   Initial impression: COVID-19 infection, mild symptoms, reassuring vital signs  We discussed risks and benefits of Paxlovid.  Patient would like a prescription for this.  She is higher relative risk for hospitalization given age, diabetes.  Detailed discussion had with with patient regarding COVID-19 precautions and written instructions given as well.  We discussed need to isolate themselves for 5 days from onset of symptoms and have 24 hours of improvement prior to breaking isolation.  We discussed that when breaking isolation, mask wearing for 5 additional days is required.  We discussed signs symptoms to return which include worsening shortness of breath, trouble breathing, or increased work of breathing.  Also return with persistent vomiting, confusion, passing out, or if they have any other concerns. Counseled on the need for rest and good hydration. Discussed that high-risk contacts should be aware of positive result and they need to quarantine and be tested if they develop any symptoms. Patient verbalizes understanding.                                 Medical Decision Making Risk Prescription drug management.  Flu-like illness, COVID+. She looks well.  No respiratory issues.  Low concern for pneumonia.  Symptomatic care indicated at this point.  She is interested in Paxlovid due to her risk factors.  No indication for admission or further workup at this time.  The patient's vital signs, pertinent lab work and imaging were reviewed and interpreted as discussed in the ED course. Hospitalization was considered for  further testing, treatments, or serial exams/observation. However as patient is well-appearing, has a stable exam, and reassuring studies today, I do not feel that they warrant admission at this time. This plan was discussed with the patient who verbalizes agreement and comfort with this plan and seems reliable and able to return to the Emergency Department with worsening or changing symptoms.          Final Clinical Impression(s) / ED Diagnoses Final diagnoses:  COVID-19    Rx / DC Orders ED Discharge Orders          Ordered    nirmatrelvir & ritonavir (PAXLOVID, 300/100,) 20 x 150 MG & 10 x 100MG  TBPK  2 times daily        11/16/22 2045    ondansetron (ZOFRAN-ODT) 4 MG disintegrating tablet  Every 8 hours PRN        11/16/22 2045              Renne Crigler, PA-C 11/16/22 2049    Lonell Grandchild, MD 11/18/22 [phone]

## 2022-11-16 NOTE — ED Notes (Signed)
Pt c/o bilateral otalgia, body aches, HA and cough Respiratory panel sent

## 2022-11-23 ENCOUNTER — Other Ambulatory Visit: Payer: Self-pay

## 2022-11-23 ENCOUNTER — Emergency Department (HOSPITAL_BASED_OUTPATIENT_CLINIC_OR_DEPARTMENT_OTHER)
Admission: EM | Admit: 2022-11-23 | Discharge: 2022-11-23 | Disposition: A | Discharge status: Home / Self Care | Payer: Medicare PPO | Attending: Emergency Medicine | Admitting: Emergency Medicine

## 2022-11-23 DIAGNOSIS — U071 COVID-19: Secondary | ICD-10-CM | POA: Diagnosis not present

## 2022-11-23 DIAGNOSIS — Z7982 Long term (current) use of aspirin: Secondary | ICD-10-CM | POA: Diagnosis not present

## 2022-11-23 LAB — SARS CORONAVIRUS 2 BY RT PCR: SARS Coronavirus 2 by RT PCR: POSITIVE — AB

## 2022-11-23 NOTE — ED Triage Notes (Signed)
Patient presents to ED via POV from home. Here requesting COVID test to return to work. Reports she tested positive a week ago and her employer wont let her return until she has a negative test. Denies symptoms.

## 2022-11-23 NOTE — Discharge Instructions (Addendum)
The CDC does not recommend repeat COVID testing to return to work.  PCR tests will stay positive after the patient has recovered and is no longer shedding virus.  Consider an antigen test (these are the over the counter tests).

## 2022-11-23 NOTE — ED Provider Notes (Signed)
[facility] Provider Note   CSN: [id number] Arrival date & time: 11/23/22  1409     History  Chief Complaint  Patient presents with   Medical Clearance    [name] is a 72 y.o. female.  72 year old female presents with request for a COVID test so that she may return to work.  Patient states that she developed symptoms 10 days ago, was seen in this ER 1 week ago and had a positive test.  She feels as though she has recovered, denies any recent fevers.  States that she works in [employer] and they required a repeat test.       Home Medications Prior to Admission medications   Medication Sig Start Date End Date Taking? Authorizing Provider  albuterol (PROVENTIL HFA;VENTOLIN HFA) 108 (90 Base) MCG/ACT inhaler Inhale 2 puffs into the lungs every 6 (six) hours as needed for wheezing or shortness of breath. 04/06/18   [name], MD  aspirin 81 MG tablet Take 81 mg by mouth 2 (two) times daily.     [provider]  BAYER CONTOUR TEST test strip USE AS DIRECTED 11/08/13   [name], MD  cholecalciferol (VITAMIN D) 1000 UNITS tablet Take 1,000 Units by mouth daily.     [provider]  clonazePAM (KLONOPIN) 0.5 MG tablet TAKE 1 TABLET(0.5 MG) BY MOUTH AT BEDTIME AS NEEDED FOR SLEEP 09/03/22   [name], MD  Cyanocobalamin (B-12 PO) Take by mouth.    [provider]  cyclobenzaprine (FLEXERIL) 10 MG tablet TAKE 1 TABLET(10 MG) BY MOUTH AT BEDTIME 07/31/22   [name], MD  Fingerstix Lancets MISC Check glucose 4-5 TIMES a day. DISPENSE Microlet Colored Lancets 11/19/19   [name], MD  glipiZIDE (GLUCOTROL XL) 10 MG 24 hr tablet TAKE 1 TABLET(10 MG) BY MOUTH DAILY WITH BREAKFAST 09/03/22   [name], MD  glucose blood (CONTOUR NEXT TEST) test strip Check glucose levels 4-5 times daily 08/26/19   [name], MD  glucose blood test strip Check glucose 4-5 a day. DISPENSE BAYER TEST  STRIPS AND LANCETS 11/02/18   [name], MD  ibuprofen (ADVIL) 800 MG tablet Take 1 tablet (800 mg total) by mouth every 8 (eight) hours as needed. 10/27/22   [name], MD  meloxicam (MOBIC) 15 MG tablet Take 1 tablet (15 mg total) by mouth daily. 10/12/21   [name], MD  metFORMIN (GLUCOPHAGE) 1000 MG tablet TAKE 1 TABLET(1000 MG) BY MOUTH TWICE DAILY WITH A MEAL 05/30/22   [name], MD  Multiple Vitamin (MULTIVITAMIN WITH MINERALS) TABS tablet Take 1 tablet by mouth daily.    [provider]  omeprazole (PRILOSEC) 20 MG capsule TAKE 1 CAPSULE(20 MG) BY MOUTH DAILY 11/22/21   [name], MD  ondansetron (ZOFRAN-ODT) 4 MG disintegrating tablet Take 1 tablet (4 mg total) by mouth every 8 (eight) hours as needed for nausea or vomiting. 11/16/22   Renne Crigler, PA-C  potassium chloride SA (KLOR-CON M) 20 MEQ tablet TAKE 2 TABLETS BY MOUTH EVERY DAY 05/24/21   [name], MD  rosuvastatin (CRESTOR) 10 MG tablet Take 1 tablet (10 mg total) by mouth daily. 12/22/21   [name], MD  valACYclovir (VALTREX) 500 MG tablet TAKE 1 TABLET(500 MG) BY MOUTH TWICE DAILY 07/08/22   [name], MD  valsartan-hydrochlorothiazide (DIOVAN-HCT) 160-25 MG tablet TAKE 1 TABLET BY MOUTH  EVERY DAY 09/21/22   [name], MD      Allergies    Tramadol and Percocet [oxycodone-acetaminophen]    Review of Systems   Review of Systems Negative except as per HPI Physical Exam Updated Vital Signs BP (!) 148/72   Pulse 61   Temp 98 F (36.7 C)   Resp 18   Ht 5\' 2"  (1.575 m)   Wt 79.8 kg   SpO2 95%   BMI 32.19 kg/m  Physical Exam Vitals and nursing note reviewed.  Constitutional:      General: She is not in acute distress.    Appearance: She is well-developed. She is not diaphoretic.  HENT:     Head: Normocephalic and atraumatic.  Pulmonary:     Effort: Pulmonary effort is normal.  Neurological:     Mental Status: She is alert and oriented to person, place, and time.   Psychiatric:        Behavior: Behavior normal.     ED Results / Procedures / Treatments   Labs (all labs ordered are listed, but only abnormal results are displayed) Labs Reviewed  SARS CORONAVIRUS 2 BY RT PCR - Abnormal; Notable for the following components:      Result Value   SARS Coronavirus 2 by RT PCR POSITIVE (*)    All other components within normal limits    EKG None  Radiology No results found.  Procedures Procedures    Medications Ordered in ED Medications - No data to display  ED Course/ Medical Decision Making/ A&P                                 Medical Decision Making  72 year old female with request for repeat COVID test.  Patient's COVID test today is positive.  Advised patient this is a PCR test which will likely remain positive for some time.  Suggest antigen test if she is curious if she is potentially still shedding virus otherwise, per CDC guidelines she is cleared to return to work.        Final Clinical Impression(s) / ED Diagnoses Final diagnoses:  COVID    Rx / DC Orders ED Discharge Orders     None         Jeannie Fend, PA-C 11/23/22 1601    Franne Forts, DO 11/23/22 2347

## 2022-11-23 NOTE — ED Notes (Signed)
Reviewed discharge instructions with pt. Work note provided

## 2022-12-20 ENCOUNTER — Other Ambulatory Visit: Payer: Self-pay | Admitting: Internal Medicine

## 2023-01-18 ENCOUNTER — Other Ambulatory Visit: Payer: Self-pay | Admitting: Internal Medicine

## 2023-01-24 ENCOUNTER — Telehealth: Payer: Self-pay

## 2023-01-24 NOTE — Telephone Encounter (Signed)
Patient called stating that her right knee has been bothering her. Dr. Magnus Ivan did surgery on her knee a few years back and she goes out of the country 10/21 and wanted to know if she could be seen before then. I didn't see any open spots for Dr. Magnus Ivan or Bronson Curb so I told patient I would send a message back and see what could be done and if she could see someone else if needed. CB # [phone]

## 2023-01-30 ENCOUNTER — Telehealth: Payer: Self-pay | Admitting: Internal Medicine

## 2023-01-30 NOTE — Telephone Encounter (Signed)
Carla Oconnor said her daughter had got something that was prescribed, so she will call her and find out what it was and call me back.

## 2023-01-30 NOTE — Telephone Encounter (Signed)
Carla Oconnor [phone]  Carla Oconnor is leaving on Sunday to go to [place] and she is asking for some medicine for diarrhea, just in case she gets that. Do you want me to schedule an appointment to discuss?

## 2023-01-31 ENCOUNTER — Ambulatory Visit: Payer: Medicare PPO | Admitting: Physician Assistant

## 2023-01-31 ENCOUNTER — Encounter: Payer: Self-pay | Admitting: Physician Assistant

## 2023-01-31 DIAGNOSIS — M1711 Unilateral primary osteoarthritis, right knee: Secondary | ICD-10-CM | POA: Diagnosis not present

## 2023-01-31 MED ORDER — METHYLPREDNISOLONE ACETATE 40 MG/ML IJ SUSP
40.0000 mg | INTRAMUSCULAR | Status: AC | PRN
Start: 2023-01-31 — End: 2023-01-31
  Administered 2023-01-31: 40 mg via INTRA_ARTICULAR

## 2023-01-31 MED ORDER — LIDOCAINE HCL 1 % IJ SOLN
3.0000 mL | INTRAMUSCULAR | Status: AC | PRN
Start: 2023-01-31 — End: 2023-01-31
  Administered 2023-01-31: 3 mL

## 2023-01-31 NOTE — Progress Notes (Signed)
Office Visit Note   Patient: Carla Oconnor           Date of Birth: Sep 06, 1950           MRN: [id number] Visit Date: 01/31/2023              Requested by: [name], MD [address] [place],  [place] [address] PCP: [name], MD  Chief Complaint  Patient presents with  . Right Knee - Pain      HPI: Davalyn is a pleasant 72 year old woman with a history of osteoarthritis of her right knee.  She periodically sees Dr. Magnus Ivan for injections.  She is going on vacation and has had some recurrence of pain in her knee she thinks this was from sitting for a long period of time  Assessment & Plan: Visit Diagnoses: Osteoarthritis right knee  Plan: Will go forward with an injection she may follow-up as needed  Follow-Up Instructions: No follow-ups on file.   Ortho Exam  Patient is alert, oriented, no adenopathy, well-dressed, normal affect, normal respiratory effort. No erythema or effusion compartments are soft and compressible she does have positive crepitus with range of motion she is neurovascularly intact  Imaging: No results found. No images are attached to the encounter.  Labs: Lab Results  Component Value Date   HGBA1C 6.2 (H) 10/24/2022   HGBA1C 6.7 (H) 04/21/2022   HGBA1C 6.2 (H) 10/07/2021   REPTSTATUS 06/02/2010 FINAL 06/01/2010   CULT  06/01/2010    Multiple bacterial morphotypes present, none predominant. Suggest appropriate recollection if clinically indicated.   LABORGA Multiple bacterial morphotypes present, none 10/29/2013   LABORGA predominant. Suggest appropriate recollection if 10/29/2013   LABORGA clinically indicated. 10/29/2013     Lab Results  Component Value Date   ALBUMIN 4.3 09/22/2016   ALBUMIN 4.2 03/24/2016   ALBUMIN 4.2 01/05/2016    Lab Results  Component Value Date   MG 2.3 10/25/2013   Lab Results  Component Value Date   VD25OH 42 03/30/2017   VD25OH 35 03/24/2015   VD25OH 21 (L) 03/17/2014    No  results found for: "PREALBUMIN"    Latest Ref Rng & Units 04/21/2022    9:41 AM 04/06/2021    9:46 AM 04/07/2020   11:04 AM  CBC EXTENDED  WBC 3.8 - 10.8 Thousand/uL 5.2  4.6  4.8   RBC 3.80 - 5.10 Million/uL 3.69  3.79  3.62   Hemoglobin 11.7 - 15.5 g/dL 41.3  24.4  01.0   HCT 35.0 - 45.0 % 33.3  34.4  32.8   Platelets 140 - 400 Thousand/uL 268  302  265   NEUT# 1,500 - 7,800 cells/uL 2,543  1,914  2,222   Lymph# 850 - 3,900 cells/uL 2,080  2,093  1,949      There is no height or weight on file to calculate BMI.  Orders:  No orders of the defined types were placed in this encounter.  No orders of the defined types were placed in this encounter.    Procedures: Large Joint Inj on 01/31/2023 2:14 PM Indications: pain and diagnostic evaluation Details: 25 G 1.5 in needle, anterolateral approach  Arthrogram: No  Medications: 40 mg methylPREDNISolone acetate 40 MG/ML; 3 mL lidocaine 1 % Outcome: tolerated well, no immediate complications Procedure, treatment alternatives, risks and benefits explained, specific risks discussed. Consent was given by the patient.    Clinical Data: No additional findings.  ROS:  All other systems negative, except as  noted in the HPI. Review of Systems  Objective: Vital Signs: There were no vitals taken for this visit.  Specialty Comments:  No specialty comments available.  PMFS History: Patient Active Problem List   Diagnosis Date Noted  . S/P right knee arthroscopy 12/12/2018  . Acute medial meniscal tear, right, initial encounter 10/08/2018  . Herpes simplex type II infection 05/06/2017  . Chronic back pain 10/25/2013  . Metabolic syndrome 08/17/2013  . Anxiety state 05/28/2012  . Insomnia 05/28/2012  . Hyperlipidemia 01/17/2011  . Hypertension 01/17/2011  . GE reflux 01/17/2011  . Asthma 01/17/2011  . Fibrocystic breast disease 01/17/2011  . Diabetes mellitus (HCC) 01/17/2011   Past Medical History:  Diagnosis Date  .  Asthma    as child and seasonal  . Diabetes mellitus without complication (HCC)   . Fibrocystic breast   . GERD (gastroesophageal reflux disease)   . Hyperlipidemia   . Hypertension   . MRSA infection 2008   right arm  . Neuromuscular disorder (HCC)    tennis elbow on right and left arm  . S/P hysterectomy     Family History  Problem Relation Age of Onset  . Stroke Father   . Breast cancer Sister   . Cancer Sister        in her shoulder  . Asthma Other   . Hypertension Other   . Cancer Other   . Diabetes Other   . Hyperlipidemia Other     Past Surgical History:  Procedure Laterality Date  . ABDOMINAL HYSTERECTOMY     2003- still has left ovary  . BREAST EXCISIONAL BIOPSY    . BREAST SURGERY     biopsy rt breast-1960- benign.Left breast 1975  - lumpectomy - benign  . CESAREAN SECTION    . COLONOSCOPY    . KNEE ARTHROSCOPY Right 12/06/2018   Procedure: RIGHT KNEE ARTHROSCOPY WITH SYNOVECTOMY/DEBRIDEMENT;  Surgeon: Kathryne Hitch, MD;  Location: [facility];  Service: Orthopedics;  Laterality: Right;  . TRIGGER FINGER RELEASE  02/16/2012   Procedure: RELEASE TRIGGER FINGER/A-1 PULLEY;  Surgeon: Wyn Forster., MD;  Location: [facility];  Service: Orthopedics;  Laterality: Right;  RIGHT RING FINGER    Social History   Occupational History  . Not on file  Tobacco Use  . Smoking status: Never  . Smokeless tobacco: Never  Vaping Use  . Vaping status: Never Used  Substance and Sexual Activity  . Alcohol use: Yes    Comment: rarely  . Drug use: No  . Sexual activity: Not Currently

## 2023-02-23 NOTE — Telephone Encounter (Signed)
Called patient to let her know we can not give out personal health information to 3rd parties without signed consent forms. I also let her know I had never had an insurance company ask for this for a quote. She is going to check with them.

## 2023-02-23 NOTE — Telephone Encounter (Signed)
Copied from CRM [id number]. Topic: General - Billing Inquiry >> Feb 23, 2023 11:32 AM Herbert Seta B wrote: Reason for CRM: Patient is getting quote for a new insurance plan, needs someone from providers office to call and verify that she is a Diabetic. Marlane Hatcher [phone] or [phone]

## 2023-03-15 ENCOUNTER — Other Ambulatory Visit: Payer: Self-pay | Admitting: Internal Medicine

## 2023-04-04 DIAGNOSIS — E119 Type 2 diabetes mellitus without complications: Secondary | ICD-10-CM | POA: Diagnosis not present

## 2023-04-04 DIAGNOSIS — H43392 Other vitreous opacities, left eye: Secondary | ICD-10-CM | POA: Diagnosis not present

## 2023-04-04 DIAGNOSIS — H04563 Stenosis of bilateral lacrimal punctum: Secondary | ICD-10-CM | POA: Diagnosis not present

## 2023-04-04 DIAGNOSIS — H04123 Dry eye syndrome of bilateral lacrimal glands: Secondary | ICD-10-CM | POA: Diagnosis not present

## 2023-04-24 ENCOUNTER — Other Ambulatory Visit: Payer: Medicare PPO

## 2023-04-24 ENCOUNTER — Other Ambulatory Visit: Payer: Self-pay

## 2023-04-24 DIAGNOSIS — E119 Type 2 diabetes mellitus without complications: Secondary | ICD-10-CM

## 2023-04-24 DIAGNOSIS — Z0000 Encounter for general adult medical examination without abnormal findings: Secondary | ICD-10-CM

## 2023-04-24 DIAGNOSIS — K219 Gastro-esophageal reflux disease without esophagitis: Secondary | ICD-10-CM

## 2023-04-24 DIAGNOSIS — R822 Biliuria: Secondary | ICD-10-CM

## 2023-04-24 DIAGNOSIS — I10 Essential (primary) hypertension: Secondary | ICD-10-CM

## 2023-04-24 DIAGNOSIS — F419 Anxiety disorder, unspecified: Secondary | ICD-10-CM

## 2023-04-24 DIAGNOSIS — E1169 Type 2 diabetes mellitus with other specified complication: Secondary | ICD-10-CM

## 2023-04-24 DIAGNOSIS — E785 Hyperlipidemia, unspecified: Secondary | ICD-10-CM | POA: Diagnosis not present

## 2023-04-24 DIAGNOSIS — M1711 Unilateral primary osteoarthritis, right knee: Secondary | ICD-10-CM

## 2023-04-24 DIAGNOSIS — Z1329 Encounter for screening for other suspected endocrine disorder: Secondary | ICD-10-CM

## 2023-04-24 NOTE — Progress Notes (Signed)
 Annual Wellness Visit   Patient Care Team: Sherrilyn Nairn, Ronal PARAS, MD as PCP - General Delford Maude BROCKS, MD as PCP - Cardiology (Cardiology) Roz Anes, MD as Consulting Physician (Ophthalmology) Roz Anes, MD as Consulting Physician (Ophthalmology)  Visit Date: 04/25/23   Chief Complaint  Patient presents with   Medicare Wellness   Annual Exam   Subjective:  Patient: Carla Oconnor, Carla Oconnor DOB: [birth year], 73 y.o. MRN: [id number]  Carla Oconnor is a 73 y.o. Carla Oconnor who presents today for her Annual Medicare Wellness Visit as well as health maintenance exam and evaluation of medical issues.   Patient has history of HTN, HLD, DM type II, Asthma treated with an Albuterol  inhaler PRN, Anxiety, Insomnia, GE Reflux, Musculoskeletal Pain treated with 15 mg Mobic  daily, Osteoarthritis, Obesity, and Hypokalemia secondary to diuretic therapy.   Mentions that she has a nevus she would like to have evaluated as she has noticed some changes. Appears to be benign but recommended contacting Dermatology for an appointment.  History of Hypertension treated with Valsartan -Hydrochlorothiazide  160-25 mg daily. Blood pressure today 120/70.   Had Vascular US  of LE 03/30/20 with minimal evidence of focal and chronic thrombus visualized in proximal small saphenous vein, but otherwise no DVT, acute superficial venous thrombosis, cystic structure in popliteal fossa, or evidence of common femoral vein obstruction in the left leg.   Had Coronary Calcium  scoring in March 2019 with a score of 3. Saw Dr. Delford, Cardiologist January 2022 and he felt her hypertension was well-controlled. She  had a normal cardiac catheterization in the remote past.   History of Hypokalemia, secondary to diuretic therapy, treated with 20 MEQ Potassium Chloride  SA. Potassium WNL at 4.6  History of Hyperlipidemia treated with Rosuvastatin  10 mg daily. 04/24/23 Lipid Panel compared to 10/24/22: LDL 106, increased from 96, otherwise  WNL. Microalbumin/Creatinine: WNL  History of Type 2 Diabetes Mellitus treated with Metformin  1000 mg daily (breaks tablet in half and takes 1 in morning and 1 at night) with meal and Glipizide  10 mg daily with breakfast. 04/24/23 HgbA1c 6.3, slight increase from 6.2 on 10/24/22. Notes that the holiday feasting may have some affect on her labs. Had her [facility] Exam in December [facility].   History of GERD treated with Omeprazole  20 mg daily.   History of Anxiety/Insomnia treated with 0.5 mg Klonopin , mainly at bedtime when she has difficulty falling asleep.   CBC: RBC 3.71, Hemoglobin 11.2, HCT 33.9, otherwise WNL; CMP: Creatinine 1.04, eGFR 57, and BUN/Creatinine Ratio 24; TSH 1.29;  PAP Smear deferred due to age  Mammogram 06/22/22 was normal, noting Breast Density Category C with repeat recommendation of 2025. History of fibrocystic breast disease.   Colonoscopy 06/09/16 found internal hemorrhoids, otherwise normal with repeat recommendation of 2028. She believes that she may have been due for a 5 year repeat. Mentions LLQ abdominal tenderness eased by Tylenol . Hasn't done anything to strain muscle. Denies constipation, abnormal stools, melena, or blood in stool.  Bone DEXA 12/22/16 with a T-score of -1.1, indicating osteopenia, with an overdue repeat recommendation of 2020. History of Osteoarthritis in Right Knee. Followed by Dr. Lonni Poli. Periodically receives Lidocaine  and Methylprednisolone  injections, most recently when seen by Ronal Dragon Persons, PA for on 01/31/23. Right Knee 2 View 11/01/21: No acute fracture. Knee is well located. Medial lateral joint lines well-maintained. Mild patellofemoral arthritic changes. In the past had had had aspiration of knee joint effusion October 2021. S/P Right Knee Arthroscopy 12/12/2018 for a  small meniscal root tear back in August 2020; was also found to have some minimal arthritic changes involving medial apartment patellofemoral  compartment at that time.    Vaccine Counseling: Due for Tdap; UTD on Covid-19, Flu, PNA, and Shingrix - she has previously mentioned she had Hepatitis C exposure through her husband but her Hepatitis C antibody was NEGATIVE when checked in 2017. Past Medical History:  Diagnosis Date   Asthma    as child and seasonal   Diabetes mellitus without complication (HCC)    Fibrocystic breast    GERD (gastroesophageal reflux disease)    Hyperlipidemia    Hypertension    MRSA infection 2008   right arm   Neuromuscular disorder (HCC)    tennis elbow on right and left arm   S/P hysterectomy   Surgical History Narrative:  2013 Left Lateral Epicondylitis & Carpal Tunnel Release Right Hand in October. 2009 Right Trigger Finger Release September.   2007 Right Third Trigger Finger treated July.   2003 S/P Hysterectomy with Right Oophorectomy and Right Salpingectomy 1975 & 1977 Cesarean Sections   1966 & 1976 Benign Right Breast Biopsies.  Family History  Problem Relation Age of Onset   Stroke Father    Breast cancer Sister    Cancer Sister        in her shoulder   Asthma Other    Hypertension Other    Cancer Other    Diabetes Other    Hyperlipidemia Other   Family History Narrative:  Father died at age 87 with a Stroke.   Mother with history of Diabetes and Hypertension.   1 brother in fairly good health but has had trouble with Crack Addiction.   4 sisters (2 full sisters and 2 half sisters): 1 sister died of Kidney Failure with history of Breast Cancer. 1 sister with history of Diabetes and Coronary Artery Disease. Social History   Social History Narrative   Social history: She is married.  She has a event organiser.  She is self-employed in case management.  She has one set of twins, boy and girl and also another son.  She lost her nephew during the summer 2019 and a single car accident [address].  Bad weather.  Her husband is a english as a second language teacher and a [employer] veteran.        In the spring 2014 she saw cardiologist regarding chest pain.  She had a negative Cardiolite  study.  She had previous Cardiolite  study in 2009 that was negative.  She had a cardiac cath with Dr. Bulah in 2003.       Family history: Father died at age 45 with stroke.  Mother with history of diabetes and hypertension.  1 brother in fairly good health but has had trouble with crack addiction.  4 sisters.  She has 2 full sisters and 2 half sisters.  1 sister died of kidney failure with history of breast cancer.  1 sister with history of diabetes and coronary disease.   Review of Systems  Constitutional:  Negative for chills, fever, malaise/fatigue and weight loss.  HENT:  Negative for hearing loss, sinus pain and sore throat.   Respiratory:  Negative for cough, hemoptysis and shortness of breath.   Cardiovascular:  Negative for chest pain, palpitations, leg swelling and PND.  Gastrointestinal:  Positive for abdominal pain (LLQ). Negative for blood in stool, constipation, diarrhea, heartburn, melena, nausea and vomiting.  Genitourinary:  Negative for dysuria, frequency and urgency.  Musculoskeletal:  Negative  for back pain, myalgias and neck pain.  Skin:  Negative for itching and rash.  Neurological:  Negative for dizziness, tingling, seizures and headaches.  Endo/Heme/Allergies:  Negative for polydipsia.  Psychiatric/Behavioral:  Negative for depression. The patient is not nervous/anxious.     Objective:  Vitals: BP 120/70   Pulse 74   Ht 5' 1.75 (1.568 m)   Wt 176 lb (79.8 kg)   SpO2 96%   BMI 32.45 kg/m  Physical Exam Vitals and nursing note reviewed.  Constitutional:      General: She is not in acute distress.    Appearance: Normal appearance. She is not ill-appearing or toxic-appearing.  HENT:     Head: Normocephalic and atraumatic.     Right Ear: Hearing, tympanic membrane, ear canal and external ear normal.     Left Ear: Hearing, tympanic membrane, ear canal and external ear  normal.     Mouth/Throat:     Pharynx: Oropharynx is clear.  Eyes:     Extraocular Movements: Extraocular movements intact.     Pupils: Pupils are equal, round, and reactive to light.  Neck:     Thyroid : No thyroid  mass, thyromegaly or thyroid  tenderness.     Vascular: No carotid bruit.  Cardiovascular:     Rate and Rhythm: Normal rate and regular rhythm. No extrasystoles are present.    Pulses:          Dorsalis pedis pulses are 1+ on the right side and 1+ on the left side.     Heart sounds: Normal heart sounds. No murmur heard.    No friction rub. No gallop.  Pulmonary:     Effort: Pulmonary effort is normal.     Breath sounds: Normal breath sounds. No decreased breath sounds, wheezing, rhonchi or rales.  Chest:     Chest wall: No mass.  Breasts:    Right: Normal.     Left: Normal.  Abdominal:     Palpations: Abdomen is soft. There is no hepatomegaly, splenomegaly or mass.     Tenderness: There is no abdominal tenderness.     Hernia: No hernia is present.  Musculoskeletal:     Cervical back: Normal range of motion.     Right lower leg: No edema.     Left lower leg: No edema.  Lymphadenopathy:     Cervical: No cervical adenopathy.     Upper Body:     Right upper body: No supraclavicular adenopathy.     Left upper body: No supraclavicular adenopathy.  Skin:    General: Skin is warm and dry.  Neurological:     General: No focal deficit present.     Mental Status: She is alert and oriented to person, place, and time. Mental status is at baseline.     Sensory: Sensation is intact.     Motor: Motor function is intact. No weakness.     Deep Tendon Reflexes: Reflexes are normal and symmetric.  Psychiatric:        Attention and Perception: Attention normal.        Mood and Affect: Mood normal.        Speech: Speech normal.        Behavior: Behavior normal.        Thought Content: Thought content normal.        Cognition and Memory: Cognition normal.        Judgment:  Judgment normal.   Most Recent Functional Status Assessment:    04/25/2023  9:50 AM  In your present state of health, do you have any difficulty performing the following activities:  Hearing? 0  Vision? 0  Difficulty concentrating or making decisions? 0  Walking or climbing stairs? 0  Dressing or bathing? 0  Doing errands, shopping? 0  Preparing Food and eating ? N  Using the Toilet? N  In the past six months, have you accidently leaked urine? N  Do you have problems with loss of bowel control? N  Managing your Medications? N  Managing your Finances? N  Housekeeping or managing your Housekeeping? N   Most Recent Fall Risk Assessment:    04/25/2023    9:51 AM  Fall Risk   Falls in the past year? 0  Number falls in past yr: 0  Injury with Fall? 0  Risk for fall due to : No Fall Risks  Follow up Education provided;Falls evaluation completed;Falls prevention discussed    Most Recent Depression Screenings:    04/25/2023   10:09 AM 04/22/2022   10:54 AM  PHQ 2/9 Scores  PHQ - 2 Score 0 0   Most Recent Cognitive Screening:    04/25/2023   10:01 AM  6CIT Screen  What Year? 0 points  What month? 0 points  What time? 0 points  Count back from 20 0 points  Months in reverse 0 points  Repeat phrase 0 points  Total Score 0 points   Results:  Studies Obtained And Personally Reviewed By Me: Mammogram 06/22/22 was normal, noting Breast Density Category C with repeat recommendation of 2025.    Colonoscopy 06/09/16 found internal hemorrhoids, otherwise normal with repeat recommendation of 2028.  Bone DEXA 12/22/16 with a T-score of -1.1, indicating osteopenia, with an overdue repeat recommendation of 2020.   Vascular US  of LE 03/30/20 RIGHT:  - There is no evidence of deep vein thrombosis in the lower extremity.  - There is no evidence of acute superficial venous thrombosis; however, a  minimal amout of focal and chronic thrombus is visualized in the proximal  small saphenous vein.   - No cystic structure found in the popliteal fossa.  LEFT:  - No evidence of common femoral vein obstruction.   Coronary Calcium  scoring in March 2019 with a score of 3.  Right Knee 2 View 11/01/21: No acute fracture. Knee is well located. Medial lateral joint lines well-maintained. Mild patellofemoral arthritic changes.  Labs:     Component Value Date/Time   NA 143 04/24/2023 1155   K 4.6 04/24/2023 1155   CL 105 04/24/2023 1155   CO2 28 04/24/2023 1155   GLUCOSE 81 04/24/2023 1155   BUN 25 04/24/2023 1155   CREATININE 1.04 (H) 04/24/2023 1155   CALCIUM  9.3 04/24/2023 1155   PROT 6.5 04/24/2023 1155   ALBUMIN 4.3 09/22/2016 1012   AST 18 04/24/2023 1155   ALT 14 04/24/2023 1155   ALKPHOS 62 09/22/2016 1012   BILITOT 0.6 04/24/2023 1155   GFRNONAA 52 (L) 10/06/2020 0929   GFRAA 60 10/06/2020 0929   Lab Results  Component Value Date   WBC 4.4 04/24/2023   HGB 11.2 (L) 04/24/2023   HCT 33.9 (L) 04/24/2023   MCV 91.4 04/24/2023   PLT 275 04/24/2023   Lab Results  Component Value Date   CHOL 190 04/24/2023   HDL 68 04/24/2023   LDLCALC 106 (H) 04/24/2023   TRIG 71 04/24/2023   CHOLHDL 2.8 04/24/2023   Lab Results  Component Value Date   HGBA1C 6.3 (H) 04/24/2023  Lab Results  Component Value Date   TSH 1.29 04/24/2023   Assessment & Plan:  Hypertension: treated with Valsartan -Hydrochlorothiazide  160-25 mg daily. Blood pressure today 120/70. Reports BP stable at home. No change in medication.  Hypokalemia, secondary to diuretic therapy: treated with 20 MEQ Potassium Chloride  SA. Potassium WNL at 4.6. Refilling Potassium Chloride  SA today.   Hyperlipidemia: treated with Rosuvastatin  10 mg daily. 04/24/23 Lipid Panel compared to 10/24/22: LDL 106, increased from 96, otherwise WNL. Microalbumin/Creatinine: WNL.   Type 2 Diabetes Mellitus: treated with Metformin  1000 mg daily (breaks tablet in half and takes 1 in morning and 1 at night) with meal and Glipizide  10 mg  daily with breakfast. 04/24/23 HgbA1c 6.3, slight increase from 6.2 on 10/24/22. Had her Eye Exam in December through [facility]. Refilling Metformin  today.   GERD: treated with Omeprazole  20 mg daily.   Anxiety/Insomnia: treated with 0.5 mg Klonopin . Refilling Klonopin  today.   Nevus: she would like to have evaluated as she has noticed some changes. Determined benign but recommended contacting dermatology for an appointment.  PAP Smear: deferred due to age.   Mammogram: 06/22/22 was normal, noting Breast Density Category C with repeat recommendation of 2025.  Colonoscopy: 06/09/16 found internal hemorrhoids, otherwise normal with repeat recommendation of 2028. She believes that she may have been due for a 5 year repeat. Mentions recent LLQ Abdominal Tenderness - recommending referral for [facility].   Bone DEXA; Osteoarthritis: Bone DEXA 12/22/16 with a T-score of -1.1, indicating osteopenia, with an overdue repeat recommendation of 2020. History of Osteoarthritis in Right Knee. Followed by Dr. Lonni Poli. Periodically receives Lidocaine  and Methylprednisolone  injections, most recently when seen by Ronal Dragon Persons, PA for on 01/31/23.  Vaccine Counseling: Due for Tdap; UTD on Covid-19, Flu, PNA, and Shingrix.    Annual wellness visit done today including the all of the following: Reviewed patient's Family Medical History Reviewed and updated list of patient's medical providers Assessment of cognitive impairment was done Assessed patient's functional ability Established a written schedule for health screening services Health Risk Assessent Completed and Reviewed  Discussed health benefits of physical activity, and encouraged her to engage in regular exercise appropriate for her age and condition.        I,Emily Lagle,acting as a neurosurgeon for Ronal JINNY Hailstone, MD.,have documented all relevant documentation on the behalf of Ronal JINNY Hailstone, MD,as directed by  Ronal JINNY Hailstone, MD while in the presence of Ronal JINNY Hailstone, MD.   I, Ronal JINNY Hailstone, MD, have reviewed all documentation for this visit. The documentation on 05/13/23 for the exam, diagnosis, procedures, and orders are all accurate and complete.

## 2023-04-25 ENCOUNTER — Encounter: Payer: Self-pay | Admitting: Internal Medicine

## 2023-04-25 ENCOUNTER — Ambulatory Visit: Payer: Medicare HMO | Admitting: Internal Medicine

## 2023-04-25 VITALS — BP 120/70 | HR 74 | Ht 61.75 in | Wt 176.0 lb

## 2023-04-25 DIAGNOSIS — Z8659 Personal history of other mental and behavioral disorders: Secondary | ICD-10-CM | POA: Diagnosis not present

## 2023-04-25 DIAGNOSIS — Z0000 Encounter for general adult medical examination without abnormal findings: Secondary | ICD-10-CM

## 2023-04-25 DIAGNOSIS — R822 Biliuria: Secondary | ICD-10-CM

## 2023-04-25 DIAGNOSIS — K219 Gastro-esophageal reflux disease without esophagitis: Secondary | ICD-10-CM | POA: Diagnosis not present

## 2023-04-25 DIAGNOSIS — E1169 Type 2 diabetes mellitus with other specified complication: Secondary | ICD-10-CM

## 2023-04-25 DIAGNOSIS — R1032 Left lower quadrant pain: Secondary | ICD-10-CM

## 2023-04-25 DIAGNOSIS — F419 Anxiety disorder, unspecified: Secondary | ICD-10-CM | POA: Diagnosis not present

## 2023-04-25 DIAGNOSIS — Z8639 Personal history of other endocrine, nutritional and metabolic disease: Secondary | ICD-10-CM

## 2023-04-25 DIAGNOSIS — E785 Hyperlipidemia, unspecified: Secondary | ICD-10-CM

## 2023-04-25 DIAGNOSIS — D229 Melanocytic nevi, unspecified: Secondary | ICD-10-CM

## 2023-04-25 DIAGNOSIS — I10 Essential (primary) hypertension: Secondary | ICD-10-CM

## 2023-04-25 DIAGNOSIS — E119 Type 2 diabetes mellitus without complications: Secondary | ICD-10-CM

## 2023-04-25 LAB — CBC WITH DIFFERENTIAL/PLATELET
Absolute Lymphocytes: 1923 {cells}/uL (ref 850–3900)
Absolute Monocytes: 370 {cells}/uL (ref 200–950)
Basophils Absolute: 40 {cells}/uL (ref 0–200)
Basophils Relative: 0.9 %
Eosinophils Absolute: 79 {cells}/uL (ref 15–500)
Eosinophils Relative: 1.8 %
HCT: 33.9 % — ABNORMAL LOW (ref 35.0–45.0)
Hemoglobin: 11.2 g/dL — ABNORMAL LOW (ref 11.7–15.5)
MCH: 30.2 pg (ref 27.0–33.0)
MCHC: 33 g/dL (ref 32.0–36.0)
MCV: 91.4 fL (ref 80.0–100.0)
MPV: 10.2 fL (ref 7.5–12.5)
Monocytes Relative: 8.4 %
Neutro Abs: 1989 {cells}/uL (ref 1500–7800)
Neutrophils Relative %: 45.2 %
Platelets: 275 10*3/uL (ref 140–400)
RBC: 3.71 10*6/uL — ABNORMAL LOW (ref 3.80–5.10)
RDW: 12.3 % (ref 11.0–15.0)
Total Lymphocyte: 43.7 %
WBC: 4.4 10*3/uL (ref 3.8–10.8)

## 2023-04-25 LAB — POCT URINALYSIS DIP (CLINITEK)
Bilirubin, UA: NEGATIVE
Blood, UA: NEGATIVE
Glucose, UA: NEGATIVE mg/dL
Ketones, POC UA: NEGATIVE mg/dL
Leukocytes, UA: NEGATIVE
Nitrite, UA: NEGATIVE
POC PROTEIN,UA: NEGATIVE
Spec Grav, UA: 1.02 (ref 1.010–1.025)
Urobilinogen, UA: 0.2 U/dL
pH, UA: 6 (ref 5.0–8.0)

## 2023-04-25 LAB — COMPLETE METABOLIC PANEL WITH GFR
AG Ratio: 2 (calc) (ref 1.0–2.5)
ALT: 14 U/L (ref 6–29)
AST: 18 U/L (ref 10–35)
Albumin: 4.3 g/dL (ref 3.6–5.1)
Alkaline phosphatase (APISO): 56 U/L (ref 37–153)
BUN/Creatinine Ratio: 24 (calc) — ABNORMAL HIGH (ref 6–22)
BUN: 25 mg/dL (ref 7–25)
CO2: 28 mmol/L (ref 20–32)
Calcium: 9.3 mg/dL (ref 8.6–10.4)
Chloride: 105 mmol/L (ref 98–110)
Creat: 1.04 mg/dL — ABNORMAL HIGH (ref 0.60–1.00)
Globulin: 2.2 g/dL (ref 1.9–3.7)
Glucose, Bld: 81 mg/dL (ref 65–99)
Potassium: 4.6 mmol/L (ref 3.5–5.3)
Sodium: 143 mmol/L (ref 135–146)
Total Bilirubin: 0.6 mg/dL (ref 0.2–1.2)
Total Protein: 6.5 g/dL (ref 6.1–8.1)
eGFR: 57 mL/min/{1.73_m2} — ABNORMAL LOW (ref >=60)

## 2023-04-25 LAB — LIPID PANEL
Cholesterol: 190 mg/dL (ref <200)
HDL: 68 mg/dL (ref >=50)
LDL Cholesterol (Calc): 106 mg/dL — ABNORMAL HIGH
Non-HDL Cholesterol (Calc): 122 mg/dL (ref <130)
Total CHOL/HDL Ratio: 2.8 (calc) (ref <5.0)
Triglycerides: 71 mg/dL (ref <150)

## 2023-04-25 LAB — HEMOGLOBIN A1C
Hgb A1c MFr Bld: 6.3 %{Hb} — ABNORMAL HIGH (ref <5.7)
Mean Plasma Glucose: 134 mg/dL
eAG (mmol/L): 7.4 mmol/L

## 2023-04-25 LAB — TSH: TSH: 1.29 m[IU]/L (ref 0.40–4.50)

## 2023-04-25 MED ORDER — CLONAZEPAM 0.5 MG PO TABS: ORAL_TABLET | ORAL | 1 refills | Status: DC

## 2023-04-25 MED ORDER — METFORMIN HCL 1000 MG PO TABS: 500.0000 mg | ORAL_TABLET | Freq: Two times a day (BID) | ORAL | 5 refills | Status: DC

## 2023-04-26 LAB — MICROALBUMIN / CREATININE URINE RATIO
Creatinine, Urine: 256 mg/dL (ref 20–275)
Microalb Creat Ratio: 4 mg/g{creat} (ref <30)
Microalb, Ur: 1 mg/dL

## 2023-04-27 ENCOUNTER — Other Ambulatory Visit: Payer: Self-pay | Admitting: Internal Medicine

## 2023-04-27 NOTE — Telephone Encounter (Signed)
 Copied from CRM [id number]. Topic: Clinical - Medication Refill >> Apr 27, 2023  2:50 PM Carla Oconnor wrote: Most Recent Primary Care Visit:  Provider: [facility] RONAL PARAS  Department: [facility]  Visit Type: MEDICARE WELL VISIT 45  Date: 04/25/2023  Medication: potassium chloride  SA (KLOR-CON  M) 20 MEQ tablet   Has the patient contacted their pharmacy? Yes (Agent: If no, request that the patient contact the pharmacy for the refill. If patient does not wish to contact the pharmacy document the reason why and proceed with request.) (Agent: If yes, when and what did the pharmacy advise?)  Is this the correct pharmacy for this prescription? Yes If no, delete pharmacy and type the correct one.  This is the patient's preferred pharmacy:  [id number] [address], [place] - [phone] AT [facility] OF [facility] & [address] [place] [address] Phone: [phone] Fax: [phone]  [id number] - [place], [place] - [address] AT CORNER OF [facility] [address] [place] [address] Phone: [phone] Fax: [phone]  [id number] - , [place] - [address] AT [facility] [address] & [address]la [place] [place] [address] Phone: [phone] Fax: [phone]   Has the prescription been filled recently? No  Is the patient out of the medication? Yes  Has the patient been seen for an appointment in the last year OR does the patient have an upcoming appointment? Yes  Can we respond through MyChart? Yes  Agent: Please be advised that Rx refills may take up to 3 business days. We ask that you follow-up with your pharmacy.

## 2023-04-28 MED ORDER — POTASSIUM CHLORIDE CRYS ER 20 MEQ PO TBCR: 20.0000 meq | EXTENDED_RELEASE_TABLET | Freq: Every day | ORAL | 3 refills | Status: AC

## 2023-05-09 ENCOUNTER — Other Ambulatory Visit: Payer: Self-pay | Admitting: Internal Medicine

## 2023-05-09 DIAGNOSIS — Z1231 Encounter for screening mammogram for malignant neoplasm of breast: Secondary | ICD-10-CM

## 2023-05-13 NOTE — Patient Instructions (Addendum)
It was a pleasure to see you today.  Labs are stable.  I think your nevus is benign but you may contact Dermatologist for an opinion if you so desire.  No change in medications.  Please go to pharmacy to have tetanus update.  Appointment has been made for follow-up here in 6 months.

## 2023-06-23 ENCOUNTER — Ambulatory Visit
Admission: RE | Admit: 2023-06-23 | Discharge: 2023-06-23 | Disposition: A | Discharge status: Home / Self Care | Payer: Medicare PPO | Source: Ambulatory Visit | Attending: Internal Medicine | Admitting: Internal Medicine

## 2023-06-23 DIAGNOSIS — Z1231 Encounter for screening mammogram for malignant neoplasm of breast: Secondary | ICD-10-CM | POA: Diagnosis not present

## 2023-07-03 DIAGNOSIS — H04123 Dry eye syndrome of bilateral lacrimal glands: Secondary | ICD-10-CM | POA: Diagnosis not present

## 2023-07-03 LAB — HM DIABETES EYE EXAM

## 2023-08-02 ENCOUNTER — Other Ambulatory Visit: Payer: Self-pay | Admitting: Student

## 2023-08-02 DIAGNOSIS — R109 Unspecified abdominal pain: Secondary | ICD-10-CM | POA: Diagnosis not present

## 2023-08-21 ENCOUNTER — Ambulatory Visit
Admission: RE | Admit: 2023-08-21 | Discharge: 2023-08-21 | Disposition: A | Discharge status: Home / Self Care | Source: Ambulatory Visit | Attending: Student | Admitting: Student

## 2023-08-21 DIAGNOSIS — R1012 Left upper quadrant pain: Secondary | ICD-10-CM | POA: Diagnosis not present

## 2023-08-21 DIAGNOSIS — R109 Unspecified abdominal pain: Secondary | ICD-10-CM

## 2023-08-21 MED ORDER — IOPAMIDOL (ISOVUE-300) INJECTION 61%
500.0000 mL | Freq: Once | INTRAVENOUS | Status: AC | PRN
  Administered 2023-08-21: 100 mL via INTRAVENOUS

## 2023-08-22 ENCOUNTER — Telehealth: Payer: Self-pay

## 2023-08-22 NOTE — Telephone Encounter (Signed)
 I called back and gave them the medical record department number to contact because that's where the request would have been sent. [facility] [phone]

## 2023-08-22 NOTE — Telephone Encounter (Signed)
 Copied from CRM [id number]. Topic: General - Other >> Aug 22, 2023  9:37 AM Emylou G wrote: Reason for CRM: Alex w/[employer] called sent req for medical records for the last 5 years.Aaron Aas did we recieve it?  they sent fax yesterday around 8am?  Pls contact them at [phone] - ext.  [id number] ( which is the case number ) - they also need to know if it is prepayment - and can we expedite?

## 2023-09-08 ENCOUNTER — Other Ambulatory Visit: Payer: Self-pay | Admitting: Internal Medicine

## 2023-09-11 ENCOUNTER — Other Ambulatory Visit: Payer: Self-pay | Admitting: Internal Medicine

## 2023-09-22 ENCOUNTER — Telehealth: Payer: Self-pay | Admitting: Internal Medicine

## 2023-09-22 ENCOUNTER — Encounter: Payer: Self-pay | Admitting: Internal Medicine

## 2023-09-22 NOTE — Telephone Encounter (Signed)
 Left message for Carla Oconnor [phone] regarding insurance info on Carla Oconnor. It seems that a mistake was made in the info that was provided to [name]. Patient and her sister are both patients in this practice. A brief questionnaire was sent here about patient possibly having cancer Dx which she does NOT have.  Her sister does have such a dx and I have attempted to call [name] to explain. I did leave a voice mail for him today asking how to rectify this. I also met briefly with Carla Oconnor today to explain and apologize. She has provided. [name]'s phone number. MJB, MD

## 2023-09-22 NOTE — Telephone Encounter (Signed)
 Patient called in wanting to talk to Dr Liane Redman, She said she had gotten information from her insurance company that stated she had a form of cancer and that they had received a response from Dr Liane Redman about it. Patient said she would really like a call back from Dr Liane Redman because she had know clue

## 2023-10-19 ENCOUNTER — Telehealth: Admitting: Physician Assistant

## 2023-10-19 ENCOUNTER — Ambulatory Visit: Payer: Self-pay

## 2023-10-19 DIAGNOSIS — J069 Acute upper respiratory infection, unspecified: Secondary | ICD-10-CM

## 2023-10-19 DIAGNOSIS — B9689 Other specified bacterial agents as the cause of diseases classified elsewhere: Secondary | ICD-10-CM | POA: Diagnosis not present

## 2023-10-19 MED ORDER — BENZONATATE 100 MG PO CAPS: 100.0000 mg | ORAL_CAPSULE | Freq: Three times a day (TID) | ORAL | 0 refills | Status: DC | PRN

## 2023-10-19 MED ORDER — AMOXICILLIN-POT CLAVULANATE 875-125 MG PO TABS: 1.0000 | ORAL_TABLET | Freq: Two times a day (BID) | ORAL | 0 refills | Status: DC

## 2023-10-19 NOTE — Patient Instructions (Signed)
 Carla Oconnor, thank you for joining [name], PA-C for today's virtual visit.  While this provider is not your primary care provider (PCP), if your PCP is located in our provider database this encounter information will be shared with them immediately following your visit.   A [facility] MyChart account gives you access to today's visit and all your visits, tests, and labs performed at [facility]  click here if you don't have a [facility] MyChart account or go to [email or web address]  Consent: (Patient) Carla Oconnor provided verbal consent for this virtual visit at the beginning of the encounter.  Current Medications:  Current Outpatient Medications:    albuterol  (PROVENTIL  HFA;VENTOLIN  HFA) 108 (90 Base) MCG/ACT inhaler, Inhale 2 puffs into the lungs every 6 (six) hours as needed for wheezing or shortness of breath., Disp: 18 g, Rfl: 11   amoxicillin-clavulanate (AUGMENTIN) 875-125 MG tablet, Take 1 tablet by mouth 2 (two) times daily., Disp: 20 tablet, Rfl: 0   aspirin  81 MG tablet, Take 81 mg by mouth 2 (two) times daily. , Disp: , Rfl:    BAYER CONTOUR TEST test strip, USE AS DIRECTED, Disp: 100 each, Rfl: 6   benzonatate (TESSALON) 100 MG capsule, Take 1-2 capsules (100-200 mg total) by mouth 3 (three) times daily as needed., Disp: 30 capsule, Rfl: 0   cholecalciferol (VITAMIN D ) 1000 UNITS tablet, Take 1,000 Units by mouth daily. , Disp: , Rfl:    clonazePAM  (KLONOPIN ) 0.5 MG tablet, TAKE 1 TABLET(0.5 MG) BY MOUTH AT BEDTIME AS NEEDED FOR SLEEP, Disp: 90 tablet, Rfl: 1   Cyanocobalamin (B-12 PO), Take by mouth., Disp: , Rfl:    cyclobenzaprine  (FLEXERIL ) 10 MG tablet, TAKE 1 TABLET(10 MG) BY MOUTH AT BEDTIME, Disp: 90 tablet, Rfl: 1   Fingerstix Lancets MISC, Check glucose 4-5 TIMES a day. DISPENSE Microlet Colored Lancets, Disp: 200 each, Rfl: 11   glipiZIDE  (GLUCOTROL  XL) 10 MG 24 hr tablet, TAKE 1 TABLET(10 MG) BY MOUTH DAILY WITH BREAKFAST,  Disp: 90 tablet, Rfl: 3   glucose blood (CONTOUR NEXT TEST) test strip, Check glucose levels 4-5 times daily, Disp: 100 each, Rfl: 12   glucose blood test strip, Check glucose 4-5 a day. DISPENSE BAYER TEST STRIPS AND LANCETS, Disp: 100 each, Rfl: 12   ibuprofen  (ADVIL ) 800 MG tablet, Take 1 tablet (800 mg total) by mouth every 8 (eight) hours as needed., Disp: 90 tablet, Rfl: 1   metFORMIN  (GLUCOPHAGE ) 1000 MG tablet, Take 0.5 tablets (500 mg total) by mouth 2 (two) times daily with a meal., Disp: 60 tablet, Rfl: 5   Multiple Vitamin (MULTIVITAMIN WITH MINERALS) TABS tablet, Take 1 tablet by mouth daily., Disp: , Rfl:    omeprazole  (PRILOSEC) 20 MG capsule, TAKE 1 CAPSULE(20 MG) BY MOUTH DAILY, Disp: 90 capsule, Rfl: 3   potassium chloride  SA (KLOR-CON  M) 20 MEQ tablet, Take 1 tablet (20 mEq total) by mouth daily., Disp: 180 tablet, Rfl: 3   rosuvastatin  (CRESTOR ) 10 MG tablet, TAKE 1 TABLET(10 MG) BY MOUTH DAILY, Disp: 90 tablet, Rfl: 3   valACYclovir  (VALTREX ) 500 MG tablet, TAKE 1 TABLET(500 MG) BY MOUTH TWICE DAILY, Disp: 10 tablet, Rfl: 3   valsartan -hydrochlorothiazide  (DIOVAN -HCT) 160-25 MG tablet, TAKE 1 TABLET BY MOUTH EVERY DAY, Disp: 90 tablet, Rfl: 1   Medications ordered in this encounter:  Meds ordered this encounter  Medications   amoxicillin-clavulanate (AUGMENTIN) 875-125 MG tablet    Sig: Take 1 tablet by mouth 2 (two) times daily.  Dispense:  20 tablet    Refill:  0    Supervising Provider:   LAMPTEY, PHILIP O [[id number]]   benzonatate (TESSALON) 100 MG capsule    Sig: Take 1-2 capsules (100-200 mg total) by mouth 3 (three) times daily as needed.    Dispense:  30 capsule    Refill:  0    Supervising Provider:   BLAISE ALEENE KIDD [[id number]]     *If you need refills on other medications prior to your next appointment, please contact your pharmacy*  Follow-Up: Call back or seek an in-person evaluation if the symptoms worsen or if the condition fails to improve as  anticipated.  [facility] [phone]  Other Instructions Upper Respiratory Infection, Adult An upper respiratory infection (URI) is a common viral infection of the nose, throat, and upper air passages that lead to the lungs. The most common type of URI is the common cold. URIs usually get better on their own, without medical treatment. What are the causes? A URI is caused by a virus. You may catch a virus by: Breathing in droplets from an infected person's cough or sneeze. Touching something that has been exposed to the virus (is contaminated) and then touching your mouth, nose, or eyes. What increases the risk? You are more likely to get a URI if: You are very young or very old. You have close contact with others, such as at work, school, or a health care facility. You smoke. You have long-term (chronic) heart or lung disease. You have a weakened disease-fighting system (immune system). You have nasal allergies or asthma. You are experiencing a lot of stress. You have poor nutrition. What are the signs or symptoms? A URI usually involves some of the following symptoms: Runny or stuffy (congested) nose. Cough. Sneezing. Sore throat. Headache. Fatigue. Fever. Loss of appetite. Pain in your forehead, behind your eyes, and over your cheekbones (sinus pain). Muscle aches. Redness or irritation of the eyes. Pressure in the ears or face. How is this diagnosed? This condition may be diagnosed based on your medical history and symptoms, and a physical exam. Your health care provider may use a swab to take a mucus sample from your nose (nasal swab). This sample can be tested to determine what virus is causing the illness. How is this treated? URIs usually get better on their own within 7-10 days. Medicines cannot cure URIs, but your health care provider may recommend certain medicines to help relieve symptoms, such as: Over-the-counter cold medicines. Cough  suppressants. Coughing is a type of defense against infection that helps to clear the respiratory system, so take these medicines only as recommended by your health care provider. Fever-reducing medicines. Follow these instructions at home: Activity Rest as needed. If you have a fever, stay home from work or school until your fever is gone or until your health care provider says your URI cannot spread to other people (is no longer contagious). Your health care provider may have you wear a face mask to prevent your infection from spreading. Relieving symptoms Gargle with a mixture of salt and water 3-4 times a day or as needed. To make salt water, completely dissolve -1 tsp (3-6 g) of salt in 1 cup (237 mL) of warm water. Use a cool-mist humidifier to add moisture to the air. This can help you breathe more easily. Eating and drinking  Drink enough fluid to keep your urine pale yellow. Eat soups and other clear broths. General instructions  Take  over-the-counter and prescription medicines only as told by your health care provider. These include cold medicines, fever reducers, and cough suppressants. Do not use any products that contain nicotine or tobacco. These products include cigarettes, chewing tobacco, and vaping devices, such as e-cigarettes. If you need help quitting, ask your health care provider. Stay away from secondhand smoke. Stay up to date on all immunizations, including the yearly (annual) flu vaccine. Keep all follow-up visits. This is important. How to prevent the spread of infection to others URIs can be contagious. To prevent the infection from spreading: Wash your hands with soap and water for at least 20 seconds. If soap and water are not available, use hand sanitizer. Avoid touching your mouth, face, eyes, or nose. Cough or sneeze into a tissue or your sleeve or elbow instead of into your hand or into the air.  Contact a health care provider if: You are getting worse  instead of better. You have a fever or chills. Your mucus is brown or red. You have yellow or brown discharge coming from your nose. You have pain in your face, especially when you bend forward. You have swollen neck glands. You have pain while swallowing. You have white areas in the back of your throat. Get help right away if: You have shortness of breath that gets worse. You have severe or persistent: Headache. Ear pain. Sinus pain. Chest pain. You have chronic lung disease along with any of the following: Making high-pitched whistling sounds when you breathe, most often when you breathe out (wheezing). Prolonged cough (more than 14 days). Coughing up blood. A change in your usual mucus. You have a stiff neck. You have changes in your: Vision. Hearing. Thinking. Mood. These symptoms may be an emergency. Get help right away. Call 911. Do not wait to see if the symptoms will go away. Do not drive yourself to the hospital. Summary An upper respiratory infection (URI) is a common infection of the nose, throat, and upper air passages that lead to the lungs. A URI is caused by a virus. URIs usually get better on their own within 7-10 days. Medicines cannot cure URIs, but your health care provider may recommend certain medicines to help relieve symptoms. This information is not intended to replace advice given to you by your health care provider. Make sure you discuss any questions you have with your health care provider. Document Revised: 11/04/2020 Document Reviewed: 11/04/2020 Elsevier Patient Education  2024 Elsevier Inc.   If you have been instructed to have an in-person evaluation today at a local Urgent Care facility, please use the link below. It will take you to a list of all of our available [facility], including address, phone number and hours of operation. Please do not delay care.  [facility]  If you or a family member do not have a  primary care provider, use the link below to schedule a visit and establish care. When you choose a [facility] primary care physician or advanced practice provider, you gain a long-term partner in health. Find a Primary Care Provider  Learn more about [facility] in-office and virtual care options: [facility] - Get Care Now

## 2023-10-19 NOTE — Telephone Encounter (Signed)
 FYI Only or Action Required?: FYI only for provider.  Patient was last seen in primary care on 04/25/2023 by Perri Ronal PARAS, MD. Called Nurse Triage reporting Cough. Symptoms began several days ago. Interventions attempted: Nothing. Symptoms are: unchanged.  Triage Disposition: Home Care- virtual UC scheduled  Patient/caregiver understands and will follow disposition?: Yes  pt is congested coughing up green flem, ear ache & has a sore throat . She needs appt I didn't see anything available until the 10th  Reason for Disposition  Cough  Answer Assessment - Initial Assessment Questions 1. ONSET: When did the cough begin?      Three days ago 2. SEVERITY: How bad is the cough today?      Cough gets bad and then she is able to cough some sputum 3. SPUTUM: Describe the color of your sputum (none, dry cough; clear, white, yellow, green)     green 4. HEMOPTYSIS: Are you coughing up any blood? If so ask: How much? (flecks, streaks, tablespoons, etc.)     denies 5. DIFFICULTY BREATHING: Are you having difficulty breathing? If Yes, ask: How bad is it? (e.g., mild, moderate, severe)    - MILD: No SOB at rest, mild SOB with walking, speaks normally in sentences, can lie down, no retractions, pulse < 100.    - MODERATE: SOB at rest, SOB with minimal exertion and prefers to sit, cannot lie down flat, speaks in phrases, mild retractions, audible wheezing, pulse 100-120.    - SEVERE: Very SOB at rest, speaks in single words, struggling to breathe, sitting hunched forward, retractions, pulse > 120      denies 6. FEVER: Do you have a fever? If Yes, ask: What is your temperature, how was it measured, and when did it start?     denies 7. CARDIAC HISTORY: Do you have any history of heart disease? (e.g., heart attack, congestive heart failure)      hypertension 8. LUNG HISTORY: Do you have any history of lung disease?  (e.g., pulmonary embolus, asthma, emphysema)     denies  10. OTHER  SYMPTOMS: Do you have any other symptoms? (e.g., runny nose, wheezing, chest pain)       Headache, earache, stopped up  Protocols used: Cough - Acute Non-Productive-A-AH

## 2023-10-19 NOTE — Progress Notes (Signed)
 Virtual Visit Consent   Carla Oconnor, you are scheduled for a virtual visit with a [facility] provider today. Just as with appointments in the office, your consent must be obtained to participate. Your consent will be active for this visit and any virtual visit you may have with one of our providers in the next 365 days. If you have a MyChart account, a copy of this consent can be sent to you electronically.  As this is a virtual visit, video technology does not allow for your provider to perform a traditional examination. This may limit your provider's ability to fully assess your condition. If your provider identifies any concerns that need to be evaluated in person or the need to arrange testing (such as labs, EKG, etc.), we will make arrangements to do so. Although advances in technology are sophisticated, we cannot ensure that it will always work on either your end or our end. If the connection with a video visit is poor, the visit may have to be switched to a telephone visit. With either a video or telephone visit, we are not always able to ensure that we have a secure connection.  By engaging in this virtual visit, you consent to the provision of healthcare and authorize for your insurance to be billed (if applicable) for the services provided during this visit. Depending on your insurance coverage, you may receive a charge related to this service.  I need to obtain your verbal consent now. Are you willing to proceed with your visit today? Carla Oconnor has provided verbal consent on 10/19/2023 for a virtual visit (video or telephone). Carla CHRISTELLA Dickinson, PA-C  Date: 10/19/2023 12:12 PM   Virtual Visit via Video Note   IDelon CHRISTELLA Oconnor, connected with  Carla Oconnor  ([id number], 08/31/1950) on 10/19/23 at 12:00 PM EDT by a video-enabled telemedicine application and verified that I am speaking with the correct person using two identifiers.  Location: Patient: Virtual Visit  Location Patient: Home Provider: Virtual Visit Location Provider: Home Office   I discussed the limitations of evaluation and management by telemedicine and the availability of in person appointments. The patient expressed understanding and agreed to proceed.    History of Present Illness: Carla Oconnor is a 73 y.o. who identifies as a female who was assigned female at birth, and is being seen today for cough and congestion.  HPI: URI  This is a new problem. The current episode started in the past 7 days. The problem has been gradually worsening. Associated symptoms include congestion, coughing (discolored and thick), ear pain (right), headaches, a plugged ear sensation, rhinorrhea, sinus pain (right side), a sore throat and swollen glands (right side anterior cervical). Pertinent negatives include no diarrhea, nausea, vomiting or wheezing. Associated symptoms comments: Fatigue. She has tried acetaminophen , decongestant and increased fluids for the symptoms. The treatment provided no relief.     Problems:  Patient Active Problem List   Diagnosis Date Noted   S/P right knee arthroscopy 12/12/2018   Acute medial meniscal tear, right, initial encounter 10/08/2018   Herpes simplex type II infection 05/06/2017   Chronic back pain 10/25/2013   Metabolic syndrome 08/17/2013   Anxiety state 05/28/2012   Insomnia 05/28/2012   Hyperlipidemia 01/17/2011   Hypertension 01/17/2011   GE reflux 01/17/2011   Asthma 01/17/2011   Fibrocystic breast disease 01/17/2011   Diabetes mellitus (HCC) 01/17/2011    Allergies:  Allergies  Allergen Reactions   Tramadol  Hives and Itching  Percocet [Oxycodone -Acetaminophen ] Itching   Medications:  Current Outpatient Medications:    albuterol  (PROVENTIL  HFA;VENTOLIN  HFA) 108 (90 Base) MCG/ACT inhaler, Inhale 2 puffs into the lungs every 6 (six) hours as needed for wheezing or shortness of breath., Disp: 18 g, Rfl: 11   amoxicillin-clavulanate (AUGMENTIN)  875-125 MG tablet, Take 1 tablet by mouth 2 (two) times daily., Disp: 20 tablet, Rfl: 0   aspirin  81 MG tablet, Take 81 mg by mouth 2 (two) times daily. , Disp: , Rfl:    BAYER CONTOUR TEST test strip, USE AS DIRECTED, Disp: 100 each, Rfl: 6   benzonatate (TESSALON) 100 MG capsule, Take 1-2 capsules (100-200 mg total) by mouth 3 (three) times daily as needed., Disp: 30 capsule, Rfl: 0   cholecalciferol (VITAMIN D ) 1000 UNITS tablet, Take 1,000 Units by mouth daily. , Disp: , Rfl:    clonazePAM  (KLONOPIN ) 0.5 MG tablet, TAKE 1 TABLET(0.5 MG) BY MOUTH AT BEDTIME AS NEEDED FOR SLEEP, Disp: 90 tablet, Rfl: 1   Cyanocobalamin (B-12 PO), Take by mouth., Disp: , Rfl:    cyclobenzaprine  (FLEXERIL ) 10 MG tablet, TAKE 1 TABLET(10 MG) BY MOUTH AT BEDTIME, Disp: 90 tablet, Rfl: 1   Fingerstix Lancets MISC, Check glucose 4-5 TIMES a day. DISPENSE Microlet Colored Lancets, Disp: 200 each, Rfl: 11   glipiZIDE  (GLUCOTROL  XL) 10 MG 24 hr tablet, TAKE 1 TABLET(10 MG) BY MOUTH DAILY WITH BREAKFAST, Disp: 90 tablet, Rfl: 3   glucose blood (CONTOUR NEXT TEST) test strip, Check glucose levels 4-5 times daily, Disp: 100 each, Rfl: 12   glucose blood test strip, Check glucose 4-5 a day. DISPENSE BAYER TEST STRIPS AND LANCETS, Disp: 100 each, Rfl: 12   ibuprofen  (ADVIL ) 800 MG tablet, Take 1 tablet (800 mg total) by mouth every 8 (eight) hours as needed., Disp: 90 tablet, Rfl: 1   metFORMIN  (GLUCOPHAGE ) 1000 MG tablet, Take 0.5 tablets (500 mg total) by mouth 2 (two) times daily with a meal., Disp: 60 tablet, Rfl: 5   Multiple Vitamin (MULTIVITAMIN WITH MINERALS) TABS tablet, Take 1 tablet by mouth daily., Disp: , Rfl:    omeprazole  (PRILOSEC) 20 MG capsule, TAKE 1 CAPSULE(20 MG) BY MOUTH DAILY, Disp: 90 capsule, Rfl: 3   potassium chloride  SA (KLOR-CON  M) 20 MEQ tablet, Take 1 tablet (20 mEq total) by mouth daily., Disp: 180 tablet, Rfl: 3   rosuvastatin  (CRESTOR ) 10 MG tablet, TAKE 1 TABLET(10 MG) BY MOUTH DAILY, Disp: 90  tablet, Rfl: 3   valACYclovir  (VALTREX ) 500 MG tablet, TAKE 1 TABLET(500 MG) BY MOUTH TWICE DAILY, Disp: 10 tablet, Rfl: 3   valsartan -hydrochlorothiazide  (DIOVAN -HCT) 160-25 MG tablet, TAKE 1 TABLET BY MOUTH EVERY DAY, Disp: 90 tablet, Rfl: 1  Observations/Objective: Patient is well-developed, well-nourished in no acute distress.  Resting comfortably at home.  Head is normocephalic, atraumatic.  No labored breathing.  Speech is clear and coherent with logical content.  Patient is alert and oriented at baseline.    Assessment and Plan: 1. Bacterial upper respiratory infection (Primary) - amoxicillin-clavulanate (AUGMENTIN) 875-125 MG tablet; Take 1 tablet by mouth 2 (two) times daily.  Dispense: 20 tablet; Refill: 0 - benzonatate (TESSALON) 100 MG capsule; Take 1-2 capsules (100-200 mg total) by mouth 3 (three) times daily as needed.  Dispense: 30 capsule; Refill: 0  - Worsening symptoms that have not responded to OTC medications.  - Will give Augmentin and Tessalon - Continue allergy medications.  - Steam and humidifier can help - Stay well hydrated and get plenty of  rest.  - Seek in person evaluation if no symptom improvement or if symptoms worsen   Follow Up Instructions: I discussed the assessment and treatment plan with the patient. The patient was provided an opportunity to ask questions and all were answered. The patient agreed with the plan and demonstrated an understanding of the instructions.  A copy of instructions were sent to the patient via MyChart unless otherwise noted below.    The patient was advised to call back or seek an in-person evaluation if the symptoms worsen or if the condition fails to improve as anticipated.    Carla CHRISTELLA Dickinson, PA-C

## 2023-10-31 ENCOUNTER — Other Ambulatory Visit: Payer: Medicare HMO

## 2023-10-31 DIAGNOSIS — E1169 Type 2 diabetes mellitus with other specified complication: Secondary | ICD-10-CM

## 2023-10-31 DIAGNOSIS — R822 Biliuria: Secondary | ICD-10-CM

## 2023-10-31 DIAGNOSIS — E785 Hyperlipidemia, unspecified: Secondary | ICD-10-CM | POA: Diagnosis not present

## 2023-10-31 DIAGNOSIS — E119 Type 2 diabetes mellitus without complications: Secondary | ICD-10-CM | POA: Diagnosis not present

## 2023-11-01 LAB — LIPID PANEL
Cholesterol: 176 mg/dL (ref <200)
HDL: 50 mg/dL (ref >=50)
LDL Cholesterol (Calc): 103 mg/dL — ABNORMAL HIGH
Non-HDL Cholesterol (Calc): 126 mg/dL (ref <130)
Total CHOL/HDL Ratio: 3.5 (calc) (ref <5.0)
Triglycerides: 124 mg/dL (ref <150)

## 2023-11-01 LAB — HEPATIC FUNCTION PANEL
AG Ratio: 1.6 (calc) (ref 1.0–2.5)
ALT: 9 U/L (ref 6–29)
AST: 13 U/L (ref 10–35)
Albumin: 4.2 g/dL (ref 3.6–5.1)
Alkaline phosphatase (APISO): 54 U/L (ref 37–153)
Bilirubin, Direct: 0.1 mg/dL (ref 0.0–0.2)
Globulin: 2.6 g/dL (ref 1.9–3.7)
Indirect Bilirubin: 0.5 mg/dL (ref 0.2–1.2)
Total Bilirubin: 0.6 mg/dL (ref 0.2–1.2)
Total Protein: 6.8 g/dL (ref 6.1–8.1)

## 2023-11-01 LAB — HEMOGLOBIN A1C
Hgb A1c MFr Bld: 6.2 % — ABNORMAL HIGH (ref <5.7)
Mean Plasma Glucose: 131 mg/dL
eAG (mmol/L): 7.3 mmol/L

## 2023-11-02 ENCOUNTER — Ambulatory Visit: Payer: Self-pay | Admitting: Internal Medicine

## 2023-11-02 ENCOUNTER — Encounter: Payer: Self-pay | Admitting: Internal Medicine

## 2023-11-02 ENCOUNTER — Ambulatory Visit: Payer: Self-pay

## 2023-11-02 ENCOUNTER — Ambulatory Visit: Payer: Medicare HMO | Admitting: Internal Medicine

## 2023-11-02 VITALS — BP 120/70 | HR 76 | Ht 61.75 in | Wt 171.0 lb

## 2023-11-02 DIAGNOSIS — E785 Hyperlipidemia, unspecified: Secondary | ICD-10-CM | POA: Diagnosis not present

## 2023-11-02 DIAGNOSIS — I10 Essential (primary) hypertension: Secondary | ICD-10-CM | POA: Diagnosis not present

## 2023-11-02 DIAGNOSIS — R822 Biliuria: Secondary | ICD-10-CM | POA: Diagnosis not present

## 2023-11-02 DIAGNOSIS — Z7984 Long term (current) use of oral hypoglycemic drugs: Secondary | ICD-10-CM

## 2023-11-02 DIAGNOSIS — E119 Type 2 diabetes mellitus without complications: Secondary | ICD-10-CM | POA: Diagnosis not present

## 2023-11-02 DIAGNOSIS — D649 Anemia, unspecified: Secondary | ICD-10-CM | POA: Diagnosis not present

## 2023-11-02 DIAGNOSIS — E1169 Type 2 diabetes mellitus with other specified complication: Secondary | ICD-10-CM

## 2023-11-02 MED ORDER — ALBUTEROL SULFATE HFA 108 (90 BASE) MCG/ACT IN AERS: 2.0000 | INHALATION_SPRAY | Freq: Four times a day (QID) | RESPIRATORY_TRACT | 11 refills | Status: AC | PRN

## 2023-11-02 MED ORDER — SITAGLIPTIN PHOSPHATE 50 MG PO TABS: 50.0000 mg | ORAL_TABLET | Freq: Every day | ORAL | 1 refills | Status: DC

## 2023-11-02 NOTE — Progress Notes (Signed)
 Patient Care Team: Perri Ronal PARAS, MD as PCP - General Delford Maude BROCKS, MD as PCP - Cardiology (Cardiology) Cleatus Collar, MD as Consulting Physician (Ophthalmology)  Visit Date: 11/02/23  Subjective:   Chief Complaint  Patient presents with  . Medical Management of Chronic Issues  . Hyperlipidemia  . Diabetes  . Hypertension   Patient PI:Carla Oconnor,Female DOB:06/20/1950,72 y.o. FMW:[id number]   72 y.o.Female presents today for 6 months follow-up for Hypertension; Hyperlipidemia; Diabetes Mellitus, type II. Patient has a past medical history of Asthma; GERD; Anxiety; Insomnia.  History of Hypertension treated with Valsartan -Hydrochlorothiazide  160-25 mg daily. History of Hypokalemia, Secondary to Diuretic Therapy, treated with Klor-con  20 meq daily. Blood Pressure: normotensive today at 120/70.   History of Hyperlipidemia treated with Rosuvastatin  10 mg daily. 10/31/2023 Lipid Panel: LDL 103, decreased from 04/24/23 LDL 106. 2019 Coronary Calcium  Score: 3.  History of Diabetes Mellitus, type II treated with Metformin  500 mg twice daily (pt says this does cause some GI side effects, has been on 500 mg 3x daily, which worsened her nausea/GI side effects) and Glipizide  10 mg daily. 10/31/2023 HgbA1c 6.2, decreased from 04/24/23 HgbA1c 6.3. Eye Exam done 06/2023 [facility], w/o diabetic retinopathy or other ocular complications with a follow-up 03/2024. Since January, when she weighed 176 lbs, she has lost 5 pounds and now weighs 171 lbsBMI 31.53.   She says that when she was seen by [facility] on 6/11 and they were concerned about her kidney functions, however 10/31/2023 Hepatic Function Panel: WNL.   Past Medical History:  Diagnosis Date  . Asthma    as child and seasonal  . Diabetes mellitus without complication (HCC)   . Fibrocystic breast   . GERD (gastroesophageal reflux disease)   . Hyperlipidemia   . Hypertension   . MRSA infection 2008   right arm  .  Neuromuscular disorder (HCC)    tennis elbow on right and left arm  . S/P hysterectomy     Allergies  Allergen Reactions  . Tramadol  Hives and Itching  . Percocet [Oxycodone -Acetaminophen ] Itching    Family History  Problem Relation Age of Onset  . Stroke Father   . Breast cancer Sister   . Cancer Sister        in her shoulder  . Asthma Other   . Hypertension Other   . Cancer Other   . Diabetes Other   . Hyperlipidemia Other    Social History   Social History Narrative   Social history: 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  She has one set of twins, boy and girl and also another son.  She lost her nephew during the summer 2019 and a single car accident [address].  Bad weather.  Her husband is a English as a second language teacher and a [place] veteran.       In the spring 2014 she saw cardiologist regarding chest pain.  She had a negative Cardiolite  study.  She had previous Cardiolite  study in 2009 that was negative.  She had a cardiac cath with Dr. Bulah in 2003.       Family history: Father died at age 76 with stroke.  Mother with history of diabetes and hypertension.  1 brother in fairly good health but has had trouble with crack addiction.  4 sisters.  She has 2 full sisters and 2 half sisters.  1 sister died of kidney failure with history of breast  cancer.  1 sister with history of diabetes and coronary disease.   Review of Systems  Constitutional:  Negative for fever and malaise/fatigue.  HENT:  Negative for congestion.   Eyes:  Negative for blurred vision.  Respiratory:  Negative for cough and shortness of breath.   Cardiovascular:  Negative for chest pain, palpitations and leg swelling.  Gastrointestinal:  Negative for vomiting.  Musculoskeletal:  Negative for back pain.  Skin:  Negative for rash.  Neurological:  Negative for loss of consciousness and headaches.     Objective:  Vitals: BP 120/70   Pulse 76   Ht 5'  1.75 (1.568 m)   Wt 171 lb (77.6 kg)   SpO2 96%   BMI 31.53 kg/m   Physical Exam Vitals and nursing note reviewed.  Constitutional:      General: She is not in acute distress.    Appearance: Normal appearance. She is not toxic-appearing.  HENT:     Head: Normocephalic and atraumatic.  Pulmonary:     Effort: Pulmonary effort is normal.  Skin:    General: Skin is warm and dry.  Neurological:     Mental Status: She is alert and oriented to person, place, and time. Mental status is at baseline.  Psychiatric:        Mood and Affect: Mood normal.        Behavior: Behavior normal.        Thought Content: Thought content normal.        Judgment: Judgment normal.     Results:  Studies Obtained And Personally Reviewed By Me: Labs:     Component Value Date/Time   NA 143 04/24/2023 1155   K 4.6 04/24/2023 1155   CL 105 04/24/2023 1155   CO2 28 04/24/2023 1155   GLUCOSE 81 04/24/2023 1155   BUN 25 04/24/2023 1155   CREATININE 1.04 (H) 04/24/2023 1155   CALCIUM  9.3 04/24/2023 1155   PROT 6.8 10/31/2023 0917   ALBUMIN 4.3 09/22/2016 1012   AST 13 10/31/2023 0917   ALT 9 10/31/2023 0917   ALKPHOS 62 09/22/2016 1012   BILITOT 0.6 10/31/2023 0917   GFRNONAA 52 (L) 10/06/2020 0929   GFRAA 60 10/06/2020 0929    Lab Results  Component Value Date   WBC 4.4 04/24/2023   HGB 11.2 (L) 04/24/2023   HCT 33.9 (L) 04/24/2023   MCV 91.4 04/24/2023   PLT 275 04/24/2023   Lab Results  Component Value Date   CHOL 176 10/31/2023   HDL 50 10/31/2023   LDLCALC 103 (H) 10/31/2023   TRIG 124 10/31/2023   CHOLHDL 3.5 10/31/2023   Lab Results  Component Value Date   HGBA1C 6.2 (H) 10/31/2023    Lab Results  Component Value Date   TSH 1.29 04/24/2023    Assessment & Plan:   Hypertension treated with Valsartan -Hydrochlorothiazide  160-25 mg daily. Blood Pressure: normotensive today at 120/70.   Hyperlipidemia treated with Rosuvastatin  10 mg daily. 10/31/2023 Lipid Panel: LDL 103,  decreased from 04/24/23 LDL 106. 2019 Coronary Calcium  Score: 3.  Diabetes Mellitus, type II treated with Metformin  500 mg twice daily (pt says this does cause some GI side effects, has been on 500 mg 3x daily, which worsened her nausea/GI side effects) and Glipizide  10 mg daily. 10/31/2023 HgbA1c 6.2, decreased from 04/24/23 HgbA1c 6.3. Eye Exam done 06/2023 [facility], w/o diabetic retinopathy or other ocular complications with a follow-up 03/2024. Since January, when she weighed 176 lbs, she has lost 5  pounds and now weighs 171 lbsBMI 31.53. For better glucose control, we discussed possibly increasing her Metformin , however due to her side effects, we will not do that and instead will switch her from Glipizide  to Januvia  50 mg to take daily - starting today discontinue Glipizide , start Januvia .   She was seen by [facility] on 6/11 and they were apparently concerned about her kidney functions, however on review of 10/31/2023 Hepatic Function Panel: WNL.   Return in 5 weeks (on 12/07/2023) for repeat labs (HgbA1c), and then on 12/08/2023 for OV, or as needed.    I,Emily Lagle,acting as a Neurosurgeon for Ronal JINNY Hailstone, MD.,have documented all relevant documentation on the behalf of Ronal JINNY Hailstone, MD,as directed by  Ronal JINNY Hailstone, MD while in the presence of Ronal JINNY Hailstone, MD.   ***

## 2023-11-02 NOTE — Telephone Encounter (Signed)
 Copied from CRM [id number]. Topic: Clinical - Prescription Issue >> Nov 02, 2023  3:48 PM Montie POUR wrote: Reason for CRM:  Ziyon is calling back to let Dr. Perri know that sitaGLIPtin  (JANUVIA ) 50 MG tablet will cost her $358.00. She cannot afford this amount. Please call her at [phone] or send message through MyChart to let her know what to do.

## 2023-11-03 LAB — BASIC METABOLIC PANEL WITH GFR
BUN/Creatinine Ratio: 21 (calc) (ref 6–22)
BUN: 24 mg/dL (ref 7–25)
CO2: 30 mmol/L (ref 20–32)
Calcium: 9.3 mg/dL (ref 8.6–10.4)
Chloride: 102 mmol/L (ref 98–110)
Creat: 1.13 mg/dL — ABNORMAL HIGH (ref 0.60–1.00)
Glucose, Bld: 84 mg/dL (ref 65–99)
Potassium: 4.3 mmol/L (ref 3.5–5.3)
Sodium: 141 mmol/L (ref 135–146)
eGFR: 51 mL/min/1.73m2 — ABNORMAL LOW (ref >=60)

## 2023-11-03 LAB — CBC WITH DIFFERENTIAL/PLATELET
Absolute Lymphocytes: 1852 {cells}/uL (ref 850–3900)
Absolute Monocytes: 522 {cells}/uL (ref 200–950)
Basophils Absolute: 42 {cells}/uL (ref 0–200)
Basophils Relative: 0.9 %
Eosinophils Absolute: 99 {cells}/uL (ref 15–500)
Eosinophils Relative: 2.1 %
HCT: 34.7 % — ABNORMAL LOW (ref 35.0–45.0)
Hemoglobin: 11 g/dL — ABNORMAL LOW (ref 11.7–15.5)
MCH: 29.6 pg (ref 27.0–33.0)
MCHC: 31.7 g/dL — ABNORMAL LOW (ref 32.0–36.0)
MCV: 93.5 fL (ref 80.0–100.0)
MPV: 9.8 fL (ref 7.5–12.5)
Monocytes Relative: 11.1 %
Neutro Abs: 2186 {cells}/uL (ref 1500–7800)
Neutrophils Relative %: 46.5 %
Platelets: 307 Thousand/uL (ref 140–400)
RBC: 3.71 Million/uL — ABNORMAL LOW (ref 3.80–5.10)
RDW: 12.1 % (ref 11.0–15.0)
Total Lymphocyte: 39.4 %
WBC: 4.7 Thousand/uL (ref 3.8–10.8)

## 2023-11-03 LAB — MICROALBUMIN / CREATININE URINE RATIO
Creatinine, Urine: 155 mg/dL (ref 20–275)
Microalb Creat Ratio: 6 mg/g{creat} (ref <30)
Microalb, Ur: 0.9 mg/dL

## 2023-11-06 ENCOUNTER — Ambulatory Visit: Payer: Self-pay | Admitting: Internal Medicine

## 2023-11-08 ENCOUNTER — Telehealth: Payer: Self-pay

## 2023-11-08 NOTE — Progress Notes (Signed)
   11/08/2023  Patient ID: Carla Oconnor, female   DOB: [birth year], 73 y.o.   MRN: [id number]  Clinic routed request from patient's PCP office stating patient was not able to afford recently prescribed Januvia  50mg  daily based on copay of approximately $350.  Contacted [facility], and they were only billing patient's [employer] D plan and not her [facility]. Provided billing information for her state health plan, and Januvia  is now going through for $47 copay.  Contacted patient, and she endorsed affordability of medication at this copay; she plans to pick the medication up and initiate therapy this week.  Does not endorse any other barriers to medication adherence. Channing DELENA Mealing, PharmD, DPLA

## 2023-11-10 ENCOUNTER — Encounter: Payer: Self-pay | Admitting: Internal Medicine

## 2023-11-10 NOTE — Patient Instructions (Addendum)
 She will return in August for repeat Hgb AIC and OV can do B-met again at that time. She should come well hydrated. Changing Glipizide  to Januvia  50 mg daily.SABRA

## 2023-11-13 ENCOUNTER — Ambulatory Visit: Payer: Self-pay

## 2023-11-13 NOTE — Telephone Encounter (Signed)
 FYI Only or Action Required?: FYI only for provider.  Patient was last seen in primary care on 11/02/2023 by Perri Ronal PARAS, MD.  Called Nurse Triage reporting Allergic Reaction.  Symptoms began several days ago.  Symptoms are: unchanged.  Triage Disposition: See Physician Within 24 Hours  Patient/caregiver understands and will follow disposition?: Yes     Copied from CRM #[id number]. Topic: Clinical - Red Word Triage >> Nov 13, 2023  4:22 PM Elle L wrote: Red Word that prompted transfer to Nurse Triage: The patient is having an allergic reaction to her sitaGLIPtin  (JANUVIA ) 50 MG tablet. She is breaking out in a rash and she is itchy on her arms and neck.    Reason for Disposition  Taking new prescription medicine  (Exceptions: Finished taking new prescription antibiotic OR questions about flushing from niacin)  Answer Assessment - Initial Assessment Questions 1. APPEARANCE of RASH: What does the rash look like? (e.g., spots, blisters, raised areas, skin peeling, scaly)     Clusters of bumps on arms, single bump on neck  3. LOCATION: Where is the rash located?     Arms and neck 4. COLOR: What color is the rash? (Note: It is difficult to assess rash color in people with darker-colored skin. When this situation occurs, simply ask the caller to describe what they see.)     Red 5. ONSET: When did the rash begin?     4 days ago after starting Januvia   6. FEVER: Do you have a fever? If Yes, ask: What is your temperature, how was it measured, and when did it start?     No 7. ITCHING: Does the rash itch? If Yes, ask: How bad is the itch? (Scale 1-10; or mild, moderate, severe)     Moderate  8. CAUSE: What do you think is causing the rash?     Due to recent medication she started  9. NEW MEDICINES: What new medicines are you taking? (e.g., name of antibiotic) When did you start taking this medication?.     Januvia   10. OTHER SYMPTOMS: Do you have any other  symptoms? (e.g., sore throat, fever, joint pain)       No  Protocols used: Rash - Widespread On Drugs-A-AH

## 2023-11-14 ENCOUNTER — Ambulatory Visit (INDEPENDENT_AMBULATORY_CARE_PROVIDER_SITE_OTHER): Admitting: Internal Medicine

## 2023-11-14 ENCOUNTER — Encounter: Payer: Self-pay | Admitting: Internal Medicine

## 2023-11-14 VITALS — BP 110/70 | HR 72 | Temp 98.1°F | Ht 61.75 in | Wt 171.0 lb

## 2023-11-14 DIAGNOSIS — L509 Urticaria, unspecified: Secondary | ICD-10-CM | POA: Diagnosis not present

## 2023-11-14 DIAGNOSIS — E119 Type 2 diabetes mellitus without complications: Secondary | ICD-10-CM | POA: Diagnosis not present

## 2023-11-14 DIAGNOSIS — Z7984 Long term (current) use of oral hypoglycemic drugs: Secondary | ICD-10-CM | POA: Diagnosis not present

## 2023-11-14 NOTE — Progress Notes (Shared)
 Patient Care Team: Perri Ronal PARAS, MD as PCP - General Delford Maude BROCKS, MD as PCP - Cardiology (Cardiology) Cleatus Collar, MD as Consulting Physician (Ophthalmology)  Visit Date: 11/14/23  Subjective:   Chief Complaint  Patient presents with   Rash    Patient states after staring Januvia  she experienced itching and noticed a rash on her neck and arm. She only took Januvia  for three days then stopped it.    Patient PI:Carla Oconnor,Female DOB:03/08/1951,73 y.o. FMW:[id number]   [name] presents today for acute visit with Rash. Patient has a past medical history of Diabetes Mellitus treated with Glipizide  10 mg daily and Metformin  500 mg twice daily with meals - HgbA1c 10/31/2023 6.3; Hypertension; Hyperlipidemia. As above, her rash reportedly shortly after she started taking Januvia  associated with itching and then she noticed the rashes on her neck and left arm. Has stopped Januvia  after 3 days of use. Rash has since resolved, but she describes rash as red with a cluster of urticaria. Past Medical History:  Diagnosis Date   Asthma    as child and seasonal   Diabetes mellitus without complication (HCC)    Fibrocystic breast    GERD (gastroesophageal reflux disease)    Hyperlipidemia    Hypertension    MRSA infection 2008   right arm   Neuromuscular disorder (HCC)    tennis elbow on right and left arm   S/P hysterectomy     Allergies  Allergen Reactions   Tramadol  Hives and Itching   Percocet [Oxycodone -Acetaminophen ] Itching    Family History  Problem Relation Age of Onset   Stroke Father    Breast cancer Sister    Cancer Sister        in her shoulder   Asthma Other    Hypertension Other    Cancer Other    Diabetes Other    Hyperlipidemia Other    Social History   Social History Narrative   Social history: 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  She has one set of twins, boy and girl and also another son.   She lost her nephew during the summer 2019 and a single car accident [address].  Bad weather.  Her husband is a English as a second language teacher and a [place] veteran.       In the spring 2014 she saw cardiologist regarding chest pain.  She had a negative Cardiolite  study.  She had previous Cardiolite  study in 2009 that was negative.  She had a cardiac cath with Dr. Bulah in 2003.       Family history: Father died at age 19 with stroke.  Mother with history of diabetes and hypertension.  1 brother in fairly good health but has had trouble with crack addiction.  4 sisters.  She has 2 full sisters and 2 half sisters.  1 sister died of kidney failure with history of breast cancer.  1 sister with history of diabetes and coronary disease.   Review of Systems  Skin:  Positive for itching and rash.  All other systems reviewed and are negative.    Objective:  Vitals: BP 110/70   Pulse 72   Temp 98.1 F (36.7 C)   Ht 5' 1.75 (1.568 m)   Wt 171 lb (77.6 kg)   SpO2 98%   BMI 31.53 kg/m   Physical Exam Vitals and nursing note reviewed.  Constitutional:      General: She  is not in acute distress.    Appearance: Normal appearance. She is not toxic-appearing.  HENT:     Head: Normocephalic and atraumatic.  Pulmonary:     Effort: Pulmonary effort is normal.  Skin:    General: Skin is warm and dry.     Comments: As of today, rash has resolved, but patient described as:   Neurological:     Mental Status: She is alert and oriented to person, place, and time. Mental status is at baseline.  Psychiatric:        Mood and Affect: Mood normal.        Behavior: Behavior normal.        Thought Content: Thought content normal.        Judgment: Judgment normal.     Results:  Studies Obtained And Personally Reviewed By Me: Labs:     Component Value Date/Time   NA 141 11/02/2023 1006   K 4.3 11/02/2023 1006   CL 102 11/02/2023 1006   CO2 30 11/02/2023 1006   GLUCOSE 84 11/02/2023 1006   BUN  24 11/02/2023 1006   CREATININE 1.13 (H) 11/02/2023 1006   CALCIUM  9.3 11/02/2023 1006   PROT 6.8 10/31/2023 0917   ALBUMIN 4.3 09/22/2016 1012   AST 13 10/31/2023 0917   ALT 9 10/31/2023 0917   ALKPHOS 62 09/22/2016 1012   BILITOT 0.6 10/31/2023 0917   GFRNONAA 52 (L) 10/06/2020 0929   GFRAA 60 10/06/2020 0929    Lab Results  Component Value Date   WBC 4.7 11/02/2023   HGB 11.0 (L) 11/02/2023   HCT 34.7 (L) 11/02/2023   MCV 93.5 11/02/2023   PLT 307 11/02/2023   Lab Results  Component Value Date   CHOL 176 10/31/2023   HDL 50 10/31/2023   LDLCALC 103 (H) 10/31/2023   TRIG 124 10/31/2023   CHOLHDL 3.5 10/31/2023   Lab Results  Component Value Date   HGBA1C 6.2 (H) 10/31/2023    Lab Results  Component Value Date   TSH 1.29 04/24/2023   Assessment & Plan:   Orders Placed This Encounter  Procedures   Ambulatory referral to Endocrinology   Rash: associated with itching, reportedly began shortly after she started taking Januvia  and then she noticed the rashes on her neck and left arm. Has stopped Januvia  after 3 days of use. Rash has since resolved, but she describes rash as red with a cluster of urticaria.  Type 2 Diabetes Mellitus: treated with Glipizide  10 mg daily and Metformin  500 mg twice daily with meals - HgbA1c 10/31/2023 6.3.  She has been on Glipizide  for many years and was started on Metformin  at the beginning of 2025, which she has tolerated, yet A1c continues to remain 6.1 or higher. She would very much like to remain off insulin  if possible, and with her reaction to Januvia , I would like to have her seen by Endocrinology for alternative management - placing referral.     I,Emily Lagle,acting as a scribe for Ronal JINNY Hailstone, MD.,have documented all relevant documentation on the behalf of Ronal JINNY Hailstone, MD,as directed by  Ronal JINNY Hailstone, MD while in the presence of Ronal JINNY Hailstone, MD.   I, Ronal JINNY Hailstone, MD, have reviewed all documentation for this visit. The  documentation on 11/15/23 for the exam, diagnosis, procedures, and orders are all accurate and complete.

## 2023-11-15 NOTE — Patient Instructions (Addendum)
 Patient reports urticarial rash with Januvia . Referral to Endocrinology to assist is selecting glucose lowering medication. Continue other meds as prescribed.Continue Metformin .Patient associates rash with starting Januvia .

## 2023-12-05 NOTE — Progress Notes (Signed)
 Patient Care Team: Perri Ronal PARAS, MD as PCP - General Delford Maude BROCKS, MD as PCP - Cardiology (Cardiology) [facility] Collar, MD as Consulting Physician (Ophthalmology)  Visit Date: 12/08/23  Subjective:   Chief Complaint  Patient presents with   Diabetes    Patient has stopped    Hypertension   Vitals:   12/08/23 0921  BP: 120/80   Patient PI:Ajmajmj I Deike,Female DOB:Mar 05, 1951,73 y.o. FMW:[id number]     [name] presents today for 6 weeks follow-up for HgbA1C. Patient has a past medical history of Asthma, diabetes mellitus without complications, Hypertension,  HLD, GERD, Acute medial meniscal tear, right, initial encounter and anxiety.    She says has been on 2.5 mL Mounjaro one a week for four weeks since July 30 to lower her HgbA1c prescribed by a nurse practitioner. Has been referred to by an endocrinologist to continue Mounjaro    Also receives vitamin infusions once a month from a nurse practitioner.    Takes 81 mg aspirin  daily for stroke prevention     Recently lost 3 pounds going from 171 to 169   Diabetes Mellitus, Type II treated with Metfromin 500 mg twice daily (attempted Metformin  3x daily for improved glucose control, which subsequently caused worsened GI side effects)  and Glipizide  10 mg daily. HgbA1c 6.2 on 10/31/2023 decreased from 6.3 on 04/24/2023. Eye exam done 06/2023 by [facility], no diabetic retinopathy or other ocular complications with follow up on 03/2024. 11/02/2023 she weighed 171 and had a BMI of 31.53.   Hypertension treated with Valsartan -Hydrochlorothiazide  160-25 mg daily, Hypokalemia treated with Klor-con  20 meq daily. Blood pressure 120/80 12/08/23  Hyperlipidemia treated with Rosuvastatin  10 mg daily, 10/31/2023 LDL 103, decreased from 04/24/2023 106, 2019 coronary calcium  score: 3     Mammogram UTD , negative   Colonoscopy UTD, negative, next one due 2028    Flu vaccine to be done in October    RSV vaccine to be  completed     Past Medical History:  Diagnosis Date   Asthma    as child and seasonal   Diabetes mellitus without complication (HCC)    Fibrocystic breast    GERD (gastroesophageal reflux disease)    Hyperlipidemia    Hypertension    MRSA infection 2008   right arm   Neuromuscular disorder (HCC)    tennis elbow on right and left arm   S/P hysterectomy     Allergies  Allergen Reactions   Tramadol  Hives and Itching   Percocet [Oxycodone -Acetaminophen ] Itching   Immunization History  Administered Date(s) Administered   Fluad Quad(high Dose 65+) 03/31/2022, 01/27/2023   Influenza Split 01/10/2012   Influenza,inj,Quad PF,6+ Mos 01/31/2014, 03/26/2015, 03/28/2016, 03/30/2017, 02/08/2019, 04/09/2020   Influenza-Unspecified 02/15/2021   Moderna Sars-Covid-2 Vaccination 05/29/2019, 06/26/2019, 03/05/2020   Pneumococcal Conjugate-13 04/23/2015   Pneumococcal Polysaccharide-23 09/29/2016   Tdap 03/28/2012, 12/23/2022   Zoster Recombinant(Shingrix) 12/06/2016, 11/25/2017   Past Surgical History:  Procedure Laterality Date   ABDOMINAL HYSTERECTOMY     2003- still has left ovary   BREAST EXCISIONAL BIOPSY     BREAST SURGERY     biopsy rt breast-1960- benign.Left breast 1975  - lumpectomy - benign   CESAREAN SECTION     COLONOSCOPY     KNEE ARTHROSCOPY Right 12/06/2018   Procedure: RIGHT KNEE ARTHROSCOPY WITH SYNOVECTOMY/DEBRIDEMENT;  Surgeon: Vernetta Lonni GRADE, MD;  Location: [facility];  Service: Orthopedics;  Laterality: Right;   TRIGGER FINGER RELEASE  02/16/2012   Procedure:  RELEASE TRIGGER FINGER/A-1 PULLEY;  Surgeon: Lamar LULLA Leonor Mickey., MD;  Location: [facility];  Service: Orthopedics;  Laterality: Right;  RIGHT RING FINGER     Family History  Problem Relation Age of Onset   Stroke Father    Breast cancer Sister    Cancer Sister        in her shoulder   Asthma Other    Hypertension Other    Cancer Other    Diabetes Other     Hyperlipidemia Other    Social History   Social History Narrative   Social history: 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  She has one set of twins, boy and girl and also another son.  She lost her nephew during the summer 2019 and a single car accident [address].  Bad weather.  Her husband is a English as a second language teacher and a [place] veteran.       In the spring 2014 she saw cardiologist regarding chest pain.  She had a negative Cardiolite  study.  She had previous Cardiolite  study in 2009 that was negative.  She had a cardiac cath with Dr. Bulah in 2003.       Family history: Father died at age 39 with stroke.  Mother with history of diabetes and hypertension.  1 brother in fairly good health but has had trouble with crack addiction.  4 sisters.  She has 2 full sisters and 2 half sisters.  1 sister died of kidney failure with history of breast cancer.  1 sister with history of diabetes and coronary disease.   Review of Systems  Constitutional:  Positive for weight loss (3 lbs). Negative for fever and malaise/fatigue.  HENT:  Positive for congestion. Negative for ear pain.   Eyes:  Negative for blurred vision.  Respiratory:  Positive for cough. Negative for shortness of breath.   Cardiovascular:  Negative for chest pain, palpitations and leg swelling.  Gastrointestinal:  Negative for vomiting.  Musculoskeletal:  Negative for back pain.  Skin:  Negative for rash.  Neurological:  Negative for loss of consciousness and headaches.     Objective:  Vitals: BP 120/80   Pulse 81   Ht 5' 1.75 (1.568 m)   Wt 169 lb (76.7 kg)   SpO2 98%   BMI 31.16 kg/m   Physical Exam Vitals and nursing note reviewed.  Constitutional:      General: She is not in acute distress.    Appearance: Normal appearance. She is not toxic-appearing.  HENT:     Head: Normocephalic and atraumatic.  Cardiovascular:     Rate and Rhythm: Normal rate and regular  rhythm. No extrasystoles are present.    Pulses: Normal pulses.     Heart sounds: Normal heart sounds. No murmur heard.    No friction rub. No gallop.  Pulmonary:     Effort: Pulmonary effort is normal. No respiratory distress.     Breath sounds: Normal breath sounds. No wheezing or rales.  Skin:    General: Skin is warm and dry.  Neurological:     Mental Status: She is alert and oriented to person, place, and time. Mental status is at baseline.  Psychiatric:        Mood and Affect: Mood normal.        Behavior: Behavior normal.        Thought Content: Thought content normal.        Judgment:  Judgment normal.     Results:  Studies Obtained And Personally Reviewed By Me:    Colonoscopy, Normal    Mammogram, Normal   Coronary Calcium  Score IMPRESSION: Coronary calcium  score of 3. This was 30 th percentile for age and sex matched control.     Labs:  CBC w/ Differential Lab Results  Component Value Date   WBC 4.7 11/02/2023   RBC 3.71 (L) 11/02/2023   HGB 11.0 (L) 11/02/2023   HCT 34.7 (L) 11/02/2023   PLT 307 11/02/2023   MCV 93.5 11/02/2023   MCH 29.6 11/02/2023   MCHC 31.7 (L) 11/02/2023   RDW 12.1 11/02/2023   MPV 9.8 11/02/2023   LYMPHSABS 2,080 04/21/2022   MONOABS 0.5 08/16/2019   BASOSABS 42 11/02/2023    Comprehensive Metabolic Panel Lab Results  Component Value Date   NA 140 12/07/2023   K 4.2 12/07/2023   CL 101 12/07/2023   CO2 31 12/07/2023   GLUCOSE 68 12/07/2023   BUN 21 12/07/2023   CREATININE 1.05 (H) 12/07/2023   CALCIUM  9.9 12/07/2023   PROT 6.8 10/31/2023   ALBUMIN 4.3 09/22/2016   AST 13 10/31/2023   ALT 9 10/31/2023   ALKPHOS 62 09/22/2016   BILITOT 0.6 10/31/2023   EGFR 56 (L) 12/07/2023   GFRNONAA 52 (L) 10/06/2020   Lipid Panel  Lab Results  Component Value Date   CHOL 176 10/31/2023   HDL 50 10/31/2023   LDLCALC 103 (H) 10/31/2023   TRIG 124 10/31/2023   A1c Lab Results  Component Value Date   HGBA1C 6.3 (H)  12/07/2023    TSH Lab Results  Component Value Date   TSH 1.29 04/24/2023   Assessment & Plan:   Orders Placed This Encounter  Procedures   AMB Referral VBCI Care Management    Referral Priority:   Routine    Referral Type:   Consultation    Referral Reason:   Care Coordination    Number of Visits Requested:   1   Ambulatory referral to Endocrinology    Referral Priority:   Routine    Referral Type:   Consultation    Referral Reason:   Specialty Services Required    Number of Visits Requested:   1   Weight Loss: Recently lost 2 pounds going from 171 to 169 lbs.  Diabetes Mellitus, Type II treated with Metfromin 500 mg twice daily (attempted Metformin  3x daily for improved glucose control, which subsequently caused worsened GI side effects)  and Glipizide  10 mg daily. Hgb AIC done December 07, 2023 was stable at 6.3% hand was 6.3 % in January.  Diabetic Eye exam done 06/2023 by [facility] revealed no diabetic retinopathy or other ocular complications with follow up on 03/2024. 11/02/2023 she weighed 171 and had a BMI of 31.53. She says has been on  Mounjaro once a week for four weeks since July 30 which was  prescribed by a nurse practitioner that visits her family and provides vitamin injections.  I have made referral to [facility] for evaluation of Diabetes.Glipizide  discontinued.    Hypertension treated with Valsartan -Hydrochlorothiazide  160-25 mg daily, Blood pressure 120/80 today.   Hypokalemia treated with Klor-con  20 meq daily. Potassium is 4.2  She has placed herself on low dose ASA 81 mg daily.  Hyperlipidemia treated with Rosuvastatin  10 mg daily, 10/31/2023 LDL 103, decreased from 04/24/2023 106,  In 2019, coronary calcium  score was excellent at 3     Health Maintenance: Mammogram UTD , negative.  Colonoscopy UTD, negative, repeat due 2028.  Vaccine Counseling: Discussed Flu vaccine - to be done in October - and RSV vaccine to be completed soon.     Return in about 6 months (around 06/09/2024).   I,Emily Lagle,acting as a Neurosurgeon for Ronal JINNY Hailstone, MD.,have documented all relevant documentation on the behalf of Ronal JINNY Hailstone, MD,as directed by  Ronal JINNY Hailstone, MD while in the presence of Ronal JINNY Hailstone, MD.  I, Ronal JINNY Hailstone, MD, have reviewed all documentation for this visit. The documentation on 12/08/2023 for the exam, diagnosis, procedures, and orders are all accurate and complete.

## 2023-12-07 ENCOUNTER — Other Ambulatory Visit

## 2023-12-07 DIAGNOSIS — E119 Type 2 diabetes mellitus without complications: Secondary | ICD-10-CM | POA: Diagnosis not present

## 2023-12-07 DIAGNOSIS — D649 Anemia, unspecified: Secondary | ICD-10-CM | POA: Diagnosis not present

## 2023-12-08 ENCOUNTER — Encounter: Payer: Self-pay | Admitting: Internal Medicine

## 2023-12-08 ENCOUNTER — Ambulatory Visit: Admitting: Internal Medicine

## 2023-12-08 VITALS — BP 120/80 | HR 81 | Ht 61.75 in | Wt 169.0 lb

## 2023-12-08 DIAGNOSIS — Z7984 Long term (current) use of oral hypoglycemic drugs: Secondary | ICD-10-CM | POA: Diagnosis not present

## 2023-12-08 DIAGNOSIS — E1165 Type 2 diabetes mellitus with hyperglycemia: Secondary | ICD-10-CM

## 2023-12-08 DIAGNOSIS — E785 Hyperlipidemia, unspecified: Secondary | ICD-10-CM

## 2023-12-08 DIAGNOSIS — I10 Essential (primary) hypertension: Secondary | ICD-10-CM

## 2023-12-08 DIAGNOSIS — E1169 Type 2 diabetes mellitus with other specified complication: Secondary | ICD-10-CM

## 2023-12-08 DIAGNOSIS — E119 Type 2 diabetes mellitus without complications: Secondary | ICD-10-CM | POA: Diagnosis not present

## 2023-12-08 DIAGNOSIS — R822 Biliuria: Secondary | ICD-10-CM

## 2023-12-08 LAB — BASIC METABOLIC PANEL WITH GFR
BUN/Creatinine Ratio: 20 (calc) (ref 6–22)
BUN: 21 mg/dL (ref 7–25)
CO2: 31 mmol/L (ref 20–32)
Calcium: 9.9 mg/dL (ref 8.6–10.4)
Chloride: 101 mmol/L (ref 98–110)
Creat: 1.05 mg/dL — ABNORMAL HIGH (ref 0.60–1.00)
Glucose, Bld: 68 mg/dL (ref 65–99)
Potassium: 4.2 mmol/L (ref 3.5–5.3)
Sodium: 140 mmol/L (ref 135–146)
eGFR: 56 mL/min/1.73m2 — ABNORMAL LOW (ref >=60)

## 2023-12-08 LAB — HEMOGLOBIN A1C
Hgb A1c MFr Bld: 6.3 % — ABNORMAL HIGH (ref <5.7)
Mean Plasma Glucose: 134 mg/dL
eAG (mmol/L): 7.4 mmol/L

## 2023-12-08 NOTE — Patient Instructions (Addendum)
 We have made referral to [facility] Endocr[facility]ology as she would like Mounjaro to be covered by insurance.Has only been on it about 4 weeks. Was prescribed by Nurse Practitioner that has been giving Vitamin infusions to family. Not sure pt needs low dose ASA. Continue Rosuvastatin . Lipid panel is stable on current dose. Recommend high dose flu vaccine at pharmacy and RSV vaccine as well. Return in 6 months.

## 2023-12-11 ENCOUNTER — Telehealth: Payer: Self-pay

## 2023-12-11 NOTE — Progress Notes (Signed)
 Complex Care Management Note  Care Guide Note 12/11/2023 Name: Carla Oconnor MRN: [id number] DOB: 1951-04-11  Carla Oconnor is a 73 y.o. year old female who sees Baxley, Ronal PARAS, MD for primary care. I reached out to Heron LILLETTE Flake by phone today to offer complex care management services.  [name] including:   The Complex Care Management services include support from the care team which includes your Nurse Care Manager, Clinical Social Worker, or Pharmacist.  The Complex Care Management team is here to help remove barriers to the health concerns and goals most important to you. Complex Care Management services are voluntary, and the patient may decline or stop services at any time by request to their care team member.   Complex Care Management Consent Status: Patient agreed to services and verbal consent obtained.   Follow up plan:  Telephone appointment with complex care management team member scheduled for:  01/09/24 @ 11 am  Encounter Outcome:  Patient Scheduled  Leotis Rase [facility] Guide  Direct Dial: [phone]  Fax [phone]

## 2023-12-11 NOTE — Progress Notes (Signed)
 Care Guide Pharmacy Note  12/11/2023 Name: Carla Oconnor MRN: [id number] DOB: 09-26-1950  Referred By: Perri Ronal PARAS, MD Reason for referral: Complex Care Management and Call Attempt #1 (Successful initial outreach scheduled with RNCM- Clayborne and PHARM D- Channing)   Carla Oconnor is a 73 y.o. year old female who is a primary care patient of Perri, Ronal PARAS, MD.  Carla Oconnor was referred to the pharmacist for assistance related to: DMII  Successful contact was made with the patient to discuss pharmacy services including being ready for the pharmacist to call at least 5 minutes before the scheduled appointment time and to have medication bottles and any blood pressure readings ready for review. The patient agreed to meet with the pharmacist via telephone visit on (date/time). 12/28/23 @ 2 PM  Leotis Cloria Davene Salome Romell Ralph Valrie, [facility] Guide  Direct Dial: [phone]  Fax [phone]

## 2023-12-12 ENCOUNTER — Other Ambulatory Visit: Payer: Self-pay | Admitting: Internal Medicine

## 2023-12-28 ENCOUNTER — Other Ambulatory Visit: Payer: Self-pay

## 2023-12-28 DIAGNOSIS — E119 Type 2 diabetes mellitus without complications: Secondary | ICD-10-CM

## 2023-12-28 NOTE — Progress Notes (Signed)
 12/28/2023 Name: Carla Oconnor MRN: [id number] DOB: Nov 21, 1950  Chief Complaint  Patient presents with   Medication Assistance   Carla Oconnor is a 73 y.o. year old female who presented for a telephone visit.   They were referred to the pharmacist by their PCP for assistance in managing medication access.   Subjective:  Care Team: Primary Care Provider: Perri Ronal PARAS, MD ; Next Scheduled Visit: 04/30/2024  Medication Access/Adherence  Current Pharmacy:  [id number] [place], [place] - [address] AT [facility] OF [facility] & [address] [place] [address] Phone: [phone] Fax: [phone]  [id number] [place], [place] - [address] AT CORNER OF [facility] [address] [place] [address] Phone: [phone] Fax: [phone]  [id number] - , [place] - [address] AT [facility] OF [address] [place] [address] Phone: [phone] Fax: [phone]  -Patient reports affordability concerns with their medications: Yes  -Patient reports access/transportation concerns to their pharmacy: No  -Patient reports adherence concerns with their medications:  No    Diabetes: Current medications: Mounjaro  2.5mg  weekly -Medications tried in the past: Januvia , metformin , glipizide  -Current glucose readings: FBG 100-110 -Patient denies hypoglycemic s/sx including dizziness, shakiness, sweating.  -Patient denies hyperglycemic symptoms including polyuria, polydipsia, polyphagia, nocturia, neuropathy, blurred vision. -Patient has been taking Mounjaro  2.5mg  weekly for 6 weeks now, and is tolerating well with BG under great control.  She states [employer] covers the medication, but her commercial plan does not, making her cost not affordable for her  Macrovascular and Microvascular Risk Reduction:  Statin? yes (rosuvastatin  10mg   daily); ACEi/ARB? yes (valsartan /hydrochlorothiazide  160/25mg  daily) Last urinary albumin/creatinine ratio:  Lab Results  Component Value Date   MICRALBCREAT 6 11/02/2023   MICRALBCREAT 4 04/25/2023   MICRALBCREAT 8 04/21/2022   MICRALBCREAT 4 04/06/2021   MICRALBCREAT 5 10/09/2018   MICRALBCREAT 6 09/26/2017   MICRALBCREAT 3 03/30/2017   Last eye exam:  Lab Results  Component Value Date   HMDIABEYEEXA No Retinopathy 07/03/2023   Last foot exam: 10/27/2022 Tobacco Use:  Tobacco Use: Low Risk  (12/08/2023)   Patient History    Smoking Tobacco Use: Never    Smokeless Tobacco Use: Never    Passive Exposure: Not on file   Objective:  Lab Results  Component Value Date   HGBA1C 6.3 (H) 12/07/2023   Lab Results  Component Value Date   CREATININE 1.05 (H) 12/07/2023   BUN 21 12/07/2023   NA 140 12/07/2023   K 4.2 12/07/2023   CL 101 12/07/2023   CO2 31 12/07/2023   Lab Results  Component Value Date   CHOL 176 10/31/2023   HDL 50 10/31/2023   LDLCALC 103 (H) 10/31/2023   TRIG 124 10/31/2023   CHOLHDL 3.5 10/31/2023   Medications Reviewed Today     Reviewed by Carla Oconnor, Carla Oconnor (Pharmacist) on 12/28/23 at 1425  Med List Status: <None>   Medication Order Taking? Sig Documenting Provider Last Dose Status Informant  albuterol  (VENTOLIN  HFA) 108 (90 Base) MCG/ACT inhaler [id number]  Inhale 2 puffs into the lungs every 6 (six) hours as needed for wheezing or shortness of breath. Perri Ronal PARAS, MD  Active   aspirin  81 MG tablet [id number]  Take 81 mg by mouth 2 (two) times daily.  [provider]  Active Self  BAYER CONTOUR TEST test strip [id number]  USE  AS DIRECTED Perri Ronal PARAS, MD  Active Self  benzonatate  (TESSALON ) 100 MG capsule [id number]  Take 1-2 capsules (100-200 mg total) by mouth 3 (three) times daily as needed. Burnette, Carla Oconnor, Carla Oconnor  Active   cholecalciferol (VITAMIN D ) 1000 UNITS tablet [id number]  Take 1,000 Units by mouth daily.  [provider]  Active Self  clonazePAM  (KLONOPIN ) 0.5 MG tablet [id number]  TAKE 1 TABLET(0.5 MG) BY MOUTH AT BEDTIME AS NEEDED FOR SLEEP Baxley, Ronal PARAS, MD  Active   Cyanocobalamin (B-12 PO) [id number]  Take by mouth. [provider]  Active   cyclobenzaprine  (FLEXERIL ) 10 MG tablet [id number]  TAKE 1 TABLET(10 MG) BY MOUTH AT BEDTIME Perri Ronal PARAS, MD  Active   Fingerstix Lancets MISC [id number]  Check glucose 4-5 TIMES a day. DISPENSE Microlet Colored Lancets Baxley, Ronal PARAS, MD  Active   glucose blood (CONTOUR NEXT TEST) test strip [id number]  Check glucose levels 4-5 times daily Perri Ronal PARAS, MD  Active   glucose blood test strip [id number]  Check glucose 4-5 a day. DISPENSE BAYER TEST STRIPS AND LANCETS Perri Ronal PARAS, MD  Active   ibuprofen  (ADVIL ) 800 MG tablet [id number]  Take 1 tablet (800 mg total) by mouth every 8 (eight) hours as needed. Perri Ronal PARAS, MD  Active   Multiple Vitamin (MULTIVITAMIN WITH MINERALS) TABS tablet [id number]  Take 1 tablet by mouth daily. [provider]  Active Self  omeprazole  (PRILOSEC) 20 MG capsule [id number]  TAKE 1 CAPSULE(20 MG) BY MOUTH DAILY Baxley, Ronal PARAS, MD  Active   potassium chloride  SA (KLOR-CON  Oconnor) 20 MEQ tablet [id number]  Take 1 tablet (20 mEq total) by mouth daily. Perri Ronal PARAS, MD  Active   rosuvastatin  (CRESTOR ) 10 MG tablet [id number]  TAKE 1 TABLET(10 MG) BY MOUTH DAILY Perri Ronal PARAS, MD  Active     Discontinued 12/28/23 1423 (Change in therapy)   tirzepatide  (MOUNJARO ) 2.5 MG/0.5ML Pen [id number] Yes Inject 2.5 mg into the skin once a week. [provider]  Active   valACYclovir  (VALTREX ) 500 MG tablet [id number]  TAKE 1 TABLET(500 MG) BY MOUTH TWICE DAILY Baxley, Ronal PARAS, MD  Active   valsartan -hydrochlorothiazide  (DIOVAN -HCT) 160-25 MG tablet [id number]  TAKE 1 TABLET BY MOUTH EVERY DAY Baxley, Ronal PARAS, MD  Active            Assessment/Plan:   Diabetes: -Currently controlled; goal A1c <7%. Cardiorenal risk  reduction is optimized.. Blood pressure is at goal <130/80. LDL is not at goal.  -Recommend to continue Mounjaro  2.5mg  weekly; patient has 2 more doses on hand at this time, and I have submitted a PA request to [facility] via CoverMyMeds (KEY [id number]) -I recommend a follow-up lipid panel around mid-October; if LDL remains >70, consider increasing rosuvastatin  to 20mg  daily and checking lipids and CMP in another 3 months  Follow Up Plan: I will monitor progress of Mounjaro  PA and keep patient/provider informed  Carla Oconnor, PharmD, DPLA

## 2023-12-29 ENCOUNTER — Telehealth: Payer: Self-pay

## 2023-12-29 MED ORDER — MOUNJARO 2.5 MG/0.5ML ~~LOC~~ SOAJ: 2.5000 mg | SUBCUTANEOUS | 1 refills | Status: DC

## 2023-12-29 NOTE — Progress Notes (Signed)
   12/29/2023  Patient ID: Carla Oconnor, female   DOB: 1950-12-23, 73 y.o.   MRN: [id number]  Prior authorization for Mounjaro  2.5mg  weekly has been approved by patient's commercial insurance plan.  Order pending for Dr. Perri to sign, because it does not appear a prescription is already on file at patient's pharmacy.  Once signed, I will contact pharmacy to check copay and notify patient.  Channing DELENA Mealing, PharmD, DPLA

## 2023-12-29 NOTE — Progress Notes (Addendum)
   12/29/2023  Patient ID: Carla Oconnor, female   DOB: 08/25/1950, 73 y.o.   MRN: [id number]  Contacted [facility] to verify coverage of Mounjaro  2.5mg  and check copay.  Medication is now going through on both commercial and medicare for $47/month.  The pharmacy is only able to fill one month at a time versus 3 months, and the medication had to be ordered and will be ready on Tuesday.  I have contacted patient to make her aware, and she endorses understanding and states this copay will be affordable.  Channing DELENA Mealing, PharmD, DPLA

## 2024-01-09 ENCOUNTER — Other Ambulatory Visit: Payer: Self-pay

## 2024-01-09 NOTE — Patient Outreach (Signed)
 Complex Care Management   Visit Note  01/09/2024  Name:  Carla Oconnor MRN: [id number] DOB: 01-23-1951  Situation: Referral received for Complex Care Management related to type 2 Diabetes, Hypertension, Asthma, Seasonal Allergies, Hyperlipidemia, right knee pain.  I obtained verbal consent from Patient.  Visit completed with Patient on the phone.  Background:   Past Medical History:  Diagnosis Date   Asthma    as child and seasonal   Diabetes mellitus without complication (HCC)    Fibrocystic breast    GERD (gastroesophageal reflux disease)    Hyperlipidemia    Hypertension    MRSA infection 2008   right arm   Neuromuscular disorder (HCC)    tennis elbow on right and left arm   S/P hysterectomy     Assessment: Patient Reported Symptoms:  Cognitive Cognitive Status: Alert and oriented to person, place, and time, Normal speech and language skills Cognitive/Intellectual Conditions Management [RPT]: None reported or documented in medical history or problem list   Health Maintenance Behaviors: Annual physical exam, Exercise, Healthy diet, Immunizations, Sleep adequate Healing Pattern: Fast Health Facilitated by: Healthy diet, Rest, Pain control  Neurological Neurological Review of Symptoms: No symptoms reported    HEENT HEENT Symptoms Reported: No symptoms reported      Cardiovascular Cardiovascular Symptoms Reported: No symptoms reported Does patient have uncontrolled Hypertension?: No Cardiovascular Management Strategies: Diet modification, Routine screening, Medication therapy, Medical device Cardiovascular Self-Management Outcome: 4 (good)  Respiratory  Respiratory Symptoms Reported: No symptoms reported Respiratory Management Strategies: medication therapy when needed for seasonal allergies Respiratory Self-Management Outcome: 4 (good)    Endocrine Endocrine Symptoms Reported: No symptoms reported Is patient diabetic?: Yes Is patient checking blood sugars at  home?: Yes List most recent blood sugar readings, include date and time of day: FBS within normal range 110 Endocrine Self-Management Outcome: 4 (good)  Gastrointestinal Gastrointestinal Symptoms Reported: No symptoms reported      Genitourinary Genitourinary Symptoms Reported: No symptoms reported    Integumentary Integumentary Symptoms Reported: Bruising Additional Integumentary Details: discussed with PCP, no concerns due to patient is taking ASA Skin Management Strategies: Routine screening Skin Self-Management Outcome: 4 (good)  Musculoskeletal Musculoskelatal Symptoms Reviewed: Difficulty walking Additional Musculoskeletal Details: right knee pain Musculoskeletal Management Strategies: Adequate rest, Routine screening Musculoskeletal Self-Management Outcome: 4 (good) Falls in the past year?: No Number of falls in past year: 1 or less Was there an injury with Fall?: No Fall Risk Category Calculator: 0 Patient Fall Risk Level: Low Fall Risk Patient at Risk for Falls Due to: No Fall Risks Fall risk Follow up: Falls evaluation completed, Education provided, Falls prevention discussed  Psychosocial Psychosocial Symptoms Reported: No symptoms reported   Major Change/Loss/Stressor/Fears (CP): Denies Quality of Family Relationships: supportive, helpful, involved Do you feel physically threatened by others?: No    01/09/2024    PHQ2-9 Depression Screening   Berklee Battey interest or pleasure in doing things Not at all  Feeling down, depressed, or hopeless Not at all  PHQ-2 - Total Score 0  Trouble falling or staying asleep, or sleeping too much    Feeling tired or having [employer] energy    Poor appetite or overeating     Feeling bad about yourself - or that you are a failure or have let yourself or your family down    Trouble concentrating on things, such as reading the newspaper or watching television    Moving or speaking so slowly that other people could have noticed.  Or the opposite -  being so fidgety or restless that you have been moving around a lot more than usual    Thoughts that you would be better off dead, or hurting yourself in some way    PHQ2-9 Total Score    If you checked off any problems, how difficult have these problems made it for you to do your work, take care of things at home, or get along with other people    Depression Interventions/Treatment      There were no vitals filed for this visit.  Medications Reviewed Today     Reviewed by Morgan Clayborne CROME, RN (Registered Nurse) on 01/09/24 at 1135  Med List Status: <None>   Medication Order Taking? Sig Documenting Provider Last Dose Status Informant  albuterol  (VENTOLIN  HFA) 108 (90 Base) MCG/ACT inhaler [id number] Yes Inhale 2 puffs into the lungs every 6 (six) hours as needed for wheezing or shortness of breath. Perri Ronal PARAS, MD  Active   aspirin  81 MG tablet [id number] Yes Take 81 mg by mouth 2 (two) times daily.   Patient taking differently: Take 81 mg by mouth daily. At nighttime   [provider]  Active Self  b complex vitamins capsule [id number] Yes Take 1 capsule by mouth daily. [provider]  Active   BAYER CONTOUR TEST test strip [id number]  USE AS DIRECTED Perri Ronal PARAS, MD  Active Self  benzonatate  (TESSALON ) 100 MG capsule [id number] Yes Take 1-2 capsules (100-200 mg total) by mouth 3 (three) times daily as needed. Burnette, Jennifer M, PA-C  Active   cholecalciferol (VITAMIN D ) 1000 UNITS tablet [id number] Yes Take 1,000 Units by mouth daily.  [provider]  Active Self  clonazePAM  (KLONOPIN ) 0.5 MG tablet [id number] Yes TAKE 1 TABLET(0.5 MG) BY MOUTH AT BEDTIME AS NEEDED FOR SLEEP Baxley, Ronal PARAS, MD  Active   Cyanocobalamin (B-12 PO) [id number]  Take by mouth. [provider]  Active   cyclobenzaprine  (FLEXERIL ) 10 MG tablet [id number]  TAKE 1 TABLET(10 MG) BY MOUTH AT BEDTIME  Patient not taking: Reported on 01/09/2024   Perri Ronal PARAS, MD  Active    Fingerstix Lancets MISC [id number]  Check glucose 4-5 TIMES a day. DISPENSE Microlet Colored Lancets Baxley, Ronal PARAS, MD  Active   [facility] EXTERNAL MEDICATION [id number] Yes Vitamin E [provider]  Active   glucose blood (CONTOUR NEXT TEST) test strip [id number]  Check glucose levels 4-5 times daily Perri Ronal PARAS, MD  Active   glucose blood test strip [id number]  Check glucose 4-5 a day. DISPENSE BAYER TEST STRIPS AND LANCETS Perri Ronal PARAS, MD  Active   ibuprofen  (ADVIL ) 800 MG tablet [id number]  Take 1 tablet (800 mg total) by mouth every 8 (eight) hours as needed.  Patient not taking: Reported on 01/09/2024   Perri Ronal PARAS, MD  Active   Multiple Vitamin (MULTIVITAMIN WITH MINERALS) TABS tablet [id number] Yes Take 1 tablet by mouth daily. [provider]  Active Self  omeprazole  (PRILOSEC) 20 MG capsule [id number] Yes TAKE 1 CAPSULE(20 MG) BY MOUTH DAILY Baxley, Ronal PARAS, MD  Active   potassium chloride  SA (KLOR-CON  M) 20 MEQ tablet [id number] Yes Take 1 tablet (20 mEq total) by mouth daily. Perri Ronal PARAS, MD  Active   rosuvastatin  (CRESTOR ) 10 MG tablet [id number] Yes TAKE 1 TABLET(10 MG) BY MOUTH DAILY Baxley, Ronal PARAS, MD  Active   tirzepatide  (MOUNJARO ) 2.5 MG/0.5ML Pen [id number] Yes Inject 2.5 mg into the skin once a week. Perri Ronal PARAS,  MD  Active   valACYclovir  (VALTREX ) 500 MG tablet [id number]  TAKE 1 TABLET(500 MG) BY MOUTH TWICE DAILY  Patient not taking: Reported on 01/09/2024   Perri Ronal PARAS, MD  Active   valsartan -hydrochlorothiazide  (DIOVAN -HCT) 160-25 MG tablet [id number] Yes TAKE 1 TABLET BY MOUTH EVERY DAY Baxley, Ronal PARAS, MD  Active             Recommendation:   Follow-up with [facility] for evaluation of right knee pain on 01/11/24 at 2:45 PM  PCP Follow-up with Dr. Perri on 02/01/24 at 9:20 AM for labs and in person with Dr. Perri on 02/05/24 at 10:30 AM Specialty provider follow-up with Dr. Dartha, Endocrinologist on 02/14/24 at 2:00  PM  Follow Up Plan:   Telephone follow up appointment date/time:  Wednesday, October 22 at 9:30 AM  Clayborne Ly RN BSN CCM [place]  [facility], [facility] Nurse Care Coordinator  Direct Dial: [phone] Website: [email or web address]

## 2024-01-10 NOTE — Patient Instructions (Signed)
 Visit Information  Thank you for taking time to visit with me today. Please don't hesitate to contact me if I can be of assistance to you before our next scheduled appointment.  Our next appointment is by telephone on Wednesday, October 22 at 09:30 AM Please call the care guide team at [phone] if you need to cancel or reschedule your appointment.   Following is a copy of your care plan:   Goals Addressed             This Visit's Progress    VBCI RN Care Plan related to chronic right knee pain       Problems:  Chronic Disease Management support and education needs related to chronic right knee pain   Goal: Over the next 60 days the Patient will continue to work with RN Care Manager and/or Social Worker to address care management and care coordination needs related to chronic right knee pain  as evidenced by adherence to care management team scheduled appointments      Interventions:   Falls Interventions: Fall risk Follow up: Falls evaluation completed Education provided Falls prevention discussed  Pain Interventions: Pain assessment performed Medications reviewed Reviewed provider established plan for pain management Discussed importance of adherence to all scheduled medical appointments Counseled on the importance of reporting any/all new or changed pain symptoms or management strategies to pain management provider Advised patient to report to care team affect of pain on daily activities Discussed use of relaxation techniques and/or diversional activities to assist with pain reduction (distraction, imagery, relaxation, massage, acupressure, TENS, heat, and cold application Reviewed with patient prescribed pharmacological and nonpharmacological pain relief strategies Assessed social determinant of health barriers Educated patient re: potential of experiencing hyperglycemia with Corticosteroid injections/oral if administered/prescribed   Patient Self-Care Activities:   Attend all scheduled provider appointments Call pharmacy for medication refills 3-7 days in advance of running out of medications Call provider office for new concerns or questions  Perform all self care activities independently  Perform IADL's (shopping, preparing meals, housekeeping, managing finances) independently Take medications as prescribed    Plan:  Follow up with provider re: right knee pain as scheduled with [facility] on 01/11/24 at 2:45 PM Telephone follow up appointment with care management team member scheduled for:  Wednesday, October 22 at 09:30 AM             Please call 1-800-273-TALK (toll free, 24 hour hotline) if you are experiencing a Mental Health or Behavioral Health Crisis or need someone to talk to.  Patient verbalizes understanding of instructions and care plan provided today and agrees to view in MyChart. Active MyChart status and patient understanding of how to access instructions and care plan via MyChart confirmed with patient.     Clayborne Ly RN BSN CCM [place]  [facility], [facility] Nurse Care Coordinator  Direct Dial: [phone] Website: [email or web address]

## 2024-01-11 ENCOUNTER — Encounter: Payer: Self-pay | Admitting: Physician Assistant

## 2024-01-11 ENCOUNTER — Ambulatory Visit (INDEPENDENT_AMBULATORY_CARE_PROVIDER_SITE_OTHER): Admitting: Physician Assistant

## 2024-01-11 ENCOUNTER — Other Ambulatory Visit (INDEPENDENT_AMBULATORY_CARE_PROVIDER_SITE_OTHER): Payer: Self-pay

## 2024-01-11 DIAGNOSIS — M1711 Unilateral primary osteoarthritis, right knee: Secondary | ICD-10-CM | POA: Diagnosis not present

## 2024-01-11 MED ORDER — METHYLPREDNISOLONE ACETATE 40 MG/ML IJ SUSP
2.0000 mg | INTRAMUSCULAR | Status: AC | PRN
  Administered 2024-01-11: 2 mg via INTRA_ARTICULAR

## 2024-01-11 MED ORDER — BUPIVACAINE HCL 0.25 % IJ SOLN
2.0000 mL | INTRAMUSCULAR | Status: AC | PRN
  Administered 2024-01-11: 2 mL via INTRA_ARTICULAR

## 2024-01-11 MED ORDER — LIDOCAINE HCL 1 % IJ SOLN
2.0000 mL | INTRAMUSCULAR | Status: AC | PRN
  Administered 2024-01-11: 2 mL

## 2024-01-11 NOTE — Progress Notes (Signed)
 Office Visit Note   Patient: Carla Oconnor           Date of Birth: 03-05-1951           MRN: [id number] Visit Date: 01/11/2024              Requested by: Perri Ronal PARAS, MD [address] [place],  [place] [address] PCP: Perri Ronal PARAS, MD   Assessment & Plan: Visit Diagnoses:  1. Unilateral primary osteoarthritis, right knee     Plan: Patient is a pleasant 73 year old woman comes in today with a 1 week history of right knee pain no particular injury.  She denies any fever or chills.  Hurts when she goes up and down the stairs.  She does have a history of some arthritis especially the patellofemoral joint.  She did get an injection into the right knee not quite a year ago and had great results until recently  Follow-Up Instructions: Return if symptoms worsen or fail to improve.   Orders:  Orders Placed This Encounter  Procedures  . XR KNEE 3 VIEW RIGHT   No orders of the defined types were placed in this encounter.     Procedures: Large Joint Inj: R knee on 01/11/2024 3:03 PM Indications: pain and diagnostic evaluation Details: 22 G 1.5 in needle, anteromedial approach  Arthrogram: No  Medications: 2 mg methylPREDNISolone  acetate 40 MG/ML; 2 mL lidocaine  1 %; 2 mL bupivacaine  0.25 % Outcome: tolerated well, no immediate complications Procedure, treatment alternatives, risks and benefits explained, specific risks discussed. Consent was given by the patient.      Clinical Data: No additional findings.   Subjective: Chief Complaint  Patient presents with  . Right Knee - Pain    HPI Carla Oconnor is a pleasant 73 year old woman who comes in with a chief complaint of right knee pain.  She denies any particular injury.  She said she has has been able to sleep since Thursday but hurting longer.  Constant pain on the side of the back of the knee.  She does have some popping but is not painful.  She describes it like a toothache.  She has had injections in the  past  Review of Systems  All other systems reviewed and are negative.    Objective: Vital Signs: There were no vitals taken for this visit.  Physical Exam Constitutional:      Appearance: Normal appearance.  Pulmonary:     Effort: Pulmonary effort is normal.  Skin:    General: Skin is warm and dry.  Neurological:     General: No focal deficit present.     Mental Status: She is alert and oriented to person, place, and time.  Psychiatric:        Mood and Affect: Mood normal.        Behavior: Behavior normal.     Ortho Exam Right knee she has no effusion redness skin is in good condition her compartments are soft and nontender she has some grinding with range of motion she is tender both over the posterior lateral and posterior medial joint lines.  Good varus valgus anterior posterior stability Specialty Comments:  No specialty comments available.  Imaging: XR KNEE 3 VIEW RIGHT Result Date: 01/11/2024 Radiographs of the right knee overall maintained alignment she does have some sclerotic changes and degenerative changes especially at the patellofemoral joint    PMFS History: Patient Active Problem List   Diagnosis Date Noted  . S/P right knee arthroscopy 12/12/2018  .  Acute medial meniscal tear, right, initial encounter 10/08/2018  . Herpes simplex type II infection 05/06/2017  . Chronic back pain 10/25/2013  . Metabolic syndrome 08/17/2013  . Anxiety state 05/28/2012  . Insomnia 05/28/2012  . Hyperlipidemia 01/17/2011  . Hypertension 01/17/2011  . GE reflux 01/17/2011  . Asthma 01/17/2011  . Fibrocystic breast disease 01/17/2011  . Diabetes mellitus (HCC) 01/17/2011   Past Medical History:  Diagnosis Date  . Asthma    as child and seasonal  . Diabetes mellitus without complication (HCC)   . Fibrocystic breast   . GERD (gastroesophageal reflux disease)   . Hyperlipidemia   . Hypertension   . MRSA infection 2008   right arm  . Neuromuscular disorder (HCC)     tennis elbow on right and left arm  . S/P hysterectomy     Family History  Problem Relation Age of Onset  . Stroke Father   . Breast cancer Sister   . Cancer Sister        in her shoulder  . Asthma Other   . Hypertension Other   . Cancer Other   . Diabetes Other   . Hyperlipidemia Other     Past Surgical History:  Procedure Laterality Date  . ABDOMINAL HYSTERECTOMY     2003- still has left ovary  . BREAST EXCISIONAL BIOPSY    . BREAST SURGERY     biopsy rt breast-1960- benign.Left breast 1975  - lumpectomy - benign  . CESAREAN SECTION    . COLONOSCOPY    . KNEE ARTHROSCOPY Right 12/06/2018   Procedure: RIGHT KNEE ARTHROSCOPY WITH SYNOVECTOMY/DEBRIDEMENT;  Surgeon: Vernetta Lonni GRADE, MD;  Location: [facility];  Service: Orthopedics;  Laterality: Right;  . TRIGGER FINGER RELEASE  02/16/2012   Procedure: RELEASE TRIGGER FINGER/A-1 PULLEY;  Surgeon: Lamar LULLA Leonor Mickey., MD;  Location: [facility];  Service: Orthopedics;  Laterality: Right;  RIGHT RING FINGER    Social History   Occupational History  . Not on file  Tobacco Use  . Smoking status: Never  . Smokeless tobacco: Never  Vaping Use  . Vaping status: Never Used  Substance and Sexual Activity  . Alcohol use: Yes    Comment: rarely  . Drug use: No  . Sexual activity: Not Currently

## 2024-02-01 ENCOUNTER — Other Ambulatory Visit

## 2024-02-01 DIAGNOSIS — E1165 Type 2 diabetes mellitus with hyperglycemia: Secondary | ICD-10-CM | POA: Diagnosis not present

## 2024-02-02 ENCOUNTER — Ambulatory Visit: Payer: Self-pay | Admitting: Internal Medicine

## 2024-02-02 LAB — HEMOGLOBIN A1C
Hgb A1c MFr Bld: 6.1 % — ABNORMAL HIGH (ref <5.7)
Mean Plasma Glucose: 128 mg/dL
eAG (mmol/L): 7.1 mmol/L

## 2024-02-05 ENCOUNTER — Ambulatory Visit (INDEPENDENT_AMBULATORY_CARE_PROVIDER_SITE_OTHER): Admitting: Internal Medicine

## 2024-02-05 ENCOUNTER — Encounter: Payer: Self-pay | Admitting: Internal Medicine

## 2024-02-05 VITALS — BP 130/80 | HR 72 | Ht 61.75 in | Wt 168.0 lb

## 2024-02-05 DIAGNOSIS — Z23 Encounter for immunization: Secondary | ICD-10-CM | POA: Diagnosis not present

## 2024-02-05 DIAGNOSIS — Z7985 Long-term (current) use of injectable non-insulin antidiabetic drugs: Secondary | ICD-10-CM | POA: Diagnosis not present

## 2024-02-05 DIAGNOSIS — F419 Anxiety disorder, unspecified: Secondary | ICD-10-CM | POA: Diagnosis not present

## 2024-02-05 DIAGNOSIS — R822 Biliuria: Secondary | ICD-10-CM

## 2024-02-05 DIAGNOSIS — E1169 Type 2 diabetes mellitus with other specified complication: Secondary | ICD-10-CM

## 2024-02-05 DIAGNOSIS — E785 Hyperlipidemia, unspecified: Secondary | ICD-10-CM | POA: Diagnosis not present

## 2024-02-05 MED ORDER — CLONAZEPAM 0.5 MG PO TABS: ORAL_TABLET | ORAL | 1 refills | Status: AC

## 2024-02-05 NOTE — Patient Instructions (Addendum)
 Patient is on Mounjaro  per another provider. Patient will be establishing with [facility] very soon. Patient has questions about dose of Mounjaro . Labs are stable. Klonopin  refilled for anxiety per patient request. Patient has appt for Medicare wellness and health maintenance exam in January.

## 2024-02-05 NOTE — Progress Notes (Signed)
 Patient Care Team: Perri Ronal PARAS, MD as PCP - General Delford Maude BROCKS, MD as PCP - Cardiology (Cardiology) Cleatus Collar, MD as Consulting Physician (Ophthalmology) Morgan Clayborne CROME, RN as [facility] Care Management  Visit Date: 02/05/24  Subjective:    Patient ID: Carla Oconnor , Female   DOB: [birth year], 73 y.o.    MRN: [id number]   73 y.o. Female presents today for Diabetes Mellitus. Patient has a past medical history of Asthma, Hypertension, hyperlipidemia . She was here in July for 6 month recheck.Was started on Januvia  at that time. When seen one month later, she had started Mounjaro  prescribed by a Nurse practitioner.Hgb AIC in August was 6.3% and had been 6.2% in July. Hgb AIC was 6.3% in January 2025.   Had CPE and Medicare Wellness visit in January 2025.   Has appt to see Dr. Dartha with [facility] October 29. She wants advice on how to adjust dose of Mounjaro  to lose more weight. I have asked her to discuss that with Dr. Dartha on October 29th. Says she has been exercising some.  History of Diabetes Mellitus, Type II treated with Mounjaro  2.5 mg weekly. 02/01/2024 HgbA1c 6.1%. She says that will notice that she gets abnormally sleepy. She said his blood sugar isn't low when she checks it. She also says that when she belches it taste sour. But she said both of these symptoms are improving. She is currently 168 Lbs BMI 30.98 and has lost 8 pounds since July 2024. She has an appointment with  Endocrinologist , Dr. Dartha at the end of October.   History of Hypertension treated with Diovan -HCT 160-25 mg daily. Blood pressure today is normal at 130/80.   History of Hyperlipidemia treated with Rosuvastatin  10 mg daily.  Lipid panel in July was essentially normal except for mild elevation  of LDL at 103.  History of Anxiety/Insomnia treated with Klonopin  0.5 mg at bedtime.    Vaccine counseling: Received Influenza vaccine today. Covid-19 vaccine due. May obtain Covid  vaccine at pharmacy. Past Medical History:  Diagnosis Date   Asthma    as child and seasonal   Diabetes mellitus without complication (HCC)    Fibrocystic breast    GERD (gastroesophageal reflux disease)    Hyperlipidemia    Hypertension    MRSA infection 2008   right arm   Neuromuscular disorder (HCC)    tennis elbow on right and left arm   S/P hysterectomy      Family History  Problem Relation Age of Onset   Stroke Father    Breast cancer Sister    Cancer Sister        in her shoulder   Asthma Other    Hypertension Other    Cancer Other    Diabetes Other    Hyperlipidemia Other     Social History   Social History Narrative   Social history: 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  She has one set of twins, boy and girl and also another son.  She lost her nephew during the summer 2019 and a single car accident [address].  Bad weather.  Her husband is a English as a second language teacher and a [place] veteran.       In the spring 2014 she saw cardiologist regarding chest pain.  She had a negative Cardiolite  study.  She had previous Cardiolite  study in 2009 that was negative.  She had a  cardiac cath with Dr. Bulah in 2003.       Family history: Father died at age 8 with stroke.  Mother with history of diabetes and hypertension.  1 brother in fairly good health but has had trouble with crack addiction.  4 sisters.  She has 2 full sisters and 2 half sisters.  1 sister died of kidney failure with history of breast cancer.  1 sister with history of diabetes and coronary disease.      Review of Systems  All other systems reviewed and are negative.       Objective:   Vitals: BP 130/80   Pulse 72   Ht 5' 1.75 (1.568 m)   Wt 168 lb (76.2 kg)   SpO2 98%   BMI 30.98 kg/m    Physical Exam Vitals and nursing note reviewed.  Constitutional:      General: She is not in acute distress.    Appearance: Normal appearance. She  is not toxic-appearing.  HENT:     Head: Normocephalic and atraumatic.  Neck:     Vascular: No carotid bruit.  Cardiovascular:     Rate and Rhythm: Normal rate and regular rhythm. No extrasystoles are present.    Pulses: Normal pulses.     Heart sounds: Normal heart sounds. No murmur heard.    No friction rub. No gallop.     Comments: Dorsalis Pedis pulses normal.  Pulmonary:     Effort: Pulmonary effort is normal. No respiratory distress.     Breath sounds: Normal breath sounds. No wheezing or rales.  Musculoskeletal:     Right lower leg: No edema.     Left lower leg: No edema.  Skin:    General: Skin is warm and dry.  Neurological:     Mental Status: She is alert and oriented to person, place, and time. Mental status is at baseline.  Psychiatric:        Mood and Affect: Mood normal.        Behavior: Behavior normal.        Thought Content: Thought content normal.        Judgment: Judgment normal.       Results:    Labs:       Component Value Date/Time   NA 140 12/07/2023 0945   K 4.2 12/07/2023 0945   CL 101 12/07/2023 0945   CO2 31 12/07/2023 0945   GLUCOSE 68 12/07/2023 0945   BUN 21 12/07/2023 0945   CREATININE 1.05 (H) 12/07/2023 0945   CALCIUM  9.9 12/07/2023 0945   PROT 6.8 10/31/2023 0917   ALBUMIN 4.3 09/22/2016 1012   AST 13 10/31/2023 0917   ALT 9 10/31/2023 0917   ALKPHOS 62 09/22/2016 1012   BILITOT 0.6 10/31/2023 0917   GFRNONAA 52 (L) 10/06/2020 0929   GFRAA 60 10/06/2020 0929     Lab Results  Component Value Date   WBC 4.7 11/02/2023   HGB 11.0 (L) 11/02/2023   HCT 34.7 (L) 11/02/2023   MCV 93.5 11/02/2023   PLT 307 11/02/2023    Lab Results  Component Value Date   CHOL 176 10/31/2023   HDL 50 10/31/2023   LDLCALC 103 (H) 10/31/2023   TRIG 124 10/31/2023   CHOLHDL 3.5 10/31/2023    Lab Results  Component Value Date   HGBA1C 6.1 (H) 02/01/2024     Lab Results  Component Value Date   TSH 1.29 04/24/2023          Assessment &  Plan:    Orders Placed This Encounter  Procedures   Flu vaccine HIGH DOSE PF(Fluzone Trivalent)   Meds ordered this encounter  Medications   clonazePAM  (KLONOPIN ) 0.5 MG tablet    Sig: TAKE 1 TABLET(0.5 MG) BY MOUTH AT BEDTIME AS NEEDED FOR SLEEP    Dispense:  90 tablet    Refill:  1    Diabetes Mellitus, Type II: treated with Mounjaro  2.5 mg weekly. 02/01/2024 HgbA1c 6.1. She says that will notice that she gets abnormally sleepy. She said his blood sugar isn't low when she checks it. She also says that when she belches it taste sour. But she said both of these symptoms are improving. She is currently 168 Lbs BMI 30.98 and has lost 8 pounds since July 2024. She has  her first appointment with  Endocrinologist , Dr. Dartha at the end of October. He can advise her further regarding dose of Mounjaro . She needs to exercise more, I think.  Hypertension: treated with Diovan -HCT 160-25 mg daily. Blood pressure today is normal at 130/80.   Hyperlipidemia: treated with Rosuvastatin  10 mg daily.  Lipid panel in July was essentially normal. LDL was 103.  Anxiety/Insomnia: treated with Klonopin  0.5 mg at bedtime.    Klonopin  refilled    Vaccine counseling: Received Influenza vaccine today. Covid-19 vaccine due.    I,Makayla C Reid,acting as a scribe for Ronal JINNY Hailstone, MD.,have documented all relevant documentation on the behalf of Ronal JINNY Hailstone, MD,as directed by  Ronal JINNY Hailstone, MD while in the presence of Ronal JINNY Hailstone, MD.   I, Ronal JINNY Hailstone, MD, have reviewed all documentation for this visit. The documentation on 02/05/2024 for the exam, diagnosis, procedures, and orders are all accurate and complete.

## 2024-02-07 ENCOUNTER — Other Ambulatory Visit: Payer: Self-pay

## 2024-02-07 NOTE — Patient Instructions (Signed)
 Visit Information  Thank you for taking time to visit with me today. Please don't hesitate to contact me if I can be of assistance to you before our next scheduled appointment.  Your next care management appointment is by telephone on Wednesday, November 12 at 09:30 AM   Please call the care guide team at [phone] if you need to cancel, schedule, or reschedule an appointment.   Please call [phone]-TALK (toll free, 24 hour hotline) if you are experiencing a Mental Health or Behavioral Health Crisis or need someone to talk to.  Clayborne Ly RN BSN CCM   [facility], [facility] Health Nurse Care Coordinator  Direct Dial: [phone] Website: [email or web address]

## 2024-02-07 NOTE — Patient Outreach (Signed)
 Complex Care Management   Visit Note  02/07/2024  Name:  Carla Oconnor MRN: [id number] DOB: 01-Nov-1950  Situation: Referral received for Complex Care Management related to type 2 Diabetes, Hypertension, Asthma, Seasonal Allergies, Hyperlipidemia, right knee pain. I obtained verbal consent from Patient.  Visit completed with Patient on the phone.  Background:   Past Medical History:  Diagnosis Date   Asthma    as child and seasonal   Diabetes mellitus without complication (HCC)    Fibrocystic breast    GERD (gastroesophageal reflux disease)    Hyperlipidemia    Hypertension    MRSA infection 2008   right arm   Neuromuscular disorder (HCC)    tennis elbow on right and left arm   S/P hysterectomy     Assessment: Patient Reported Symptoms:  Cognitive Cognitive Status: Alert and oriented to person, place, and time, Normal speech and language skills Cognitive/Intellectual Conditions Management [RPT]: None reported or documented in medical history or problem list   Health Maintenance Behaviors: Annual physical exam, Exercise, Healthy diet, Immunizations, Sleep adequate Health Facilitated by: Healthy diet, Rest  Neurological Neurological Review of Symptoms: No symptoms reported    HEENT HEENT Symptoms Reported: No symptoms reported      Cardiovascular Cardiovascular Symptoms Reported: No symptoms reported    Respiratory Respiratory Symptoms Reported: No symptoms reported    Endocrine Endocrine Symptoms Reported: No symptoms reported Is patient diabetic?: Yes Is patient checking blood sugars at home?: Yes List most recent blood sugar readings, include date and time of day: not given today Endocrine Self-Management Outcome: 4 (good) Endocrine Comment: Reviewed and discussed patient's upcoming scheduled appointment with Dr. Dartha, Endocrinologist  Gastrointestinal Gastrointestinal Symptoms Reported: Other Other Gastrointestinal Symptoms: change in bowel pattern since  starting Mounjaro  Gastrointestinal Management Strategies: Diet modification Gastrointestinal Self-Management Outcome: 3 (uncertain) Gastrointestinal Comment: Educated patient re: potential SE including constipation secondary to taking Mounjaro ; Encouraged patient to discuss this further with her Endocrinologist    Genitourinary Genitourinary Symptoms Reported: No symptoms reported    Integumentary Integumentary Symptoms Reported: No symptoms reported    Musculoskeletal Musculoskelatal Symptoms Reviewed: Joint pain Additional Musculoskeletal Details: patient reports getting relief from a Corticosteroid injection to her right knee Musculoskeletal Management Strategies: Medication therapy, Routine screening, Adequate rest Musculoskeletal Self-Management Outcome: 4 (good) Falls in the past year?: No Number of falls in past year: 1 or less Was there an injury with Fall?: No Fall Risk Category Calculator: 0 Patient Fall Risk Level: Low Fall Risk Patient at Risk for Falls Due to: No Fall Risks Fall risk Follow up: Falls evaluation completed  Psychosocial Psychosocial Symptoms Reported: No symptoms reported   Major Change/Loss/Stressor/Fears (CP): Denies Quality of Family Relationships: helpful, involved, supportive Do you feel physically threatened by others?: No    02/07/2024    PHQ2-9 Depression Screening   Luis Sami interest or pleasure in doing things    Feeling down, depressed, or hopeless    PHQ-2 - Total Score    Trouble falling or staying asleep, or sleeping too much    Feeling tired or having Mieka Leaton energy    Poor appetite or overeating     Feeling bad about yourself - or that you are a failure or have let yourself or your family down    Trouble concentrating on things, such as reading the newspaper or watching television    Moving or speaking so slowly that other people could have noticed.  Or the opposite - being so fidgety or restless that you have been  moving around a lot more  than usual    Thoughts that you would be better off dead, or hurting yourself in some way    PHQ2-9 Total Score    If you checked off any problems, how difficult have these problems made it for you to do your work, take care of things at home, or get along with other people    Depression Interventions/Treatment      There were no vitals filed for this visit.  Medications Reviewed Today     Reviewed by Morgan Clayborne CROME, RN (Registered Nurse) on 02/07/24 at 1018  Med List Status: <None>   Medication Order Taking? Sig Documenting Provider Last Dose Status Informant  albuterol  (VENTOLIN  HFA) 108 (90 Base) MCG/ACT inhaler [id number]  Inhale 2 puffs into the lungs every 6 (six) hours as needed for wheezing or shortness of breath. Perri Ronal PARAS, MD  Active   aspirin  81 MG tablet [id number]  Take 81 mg by mouth 2 (two) times daily.   Patient taking differently: Take 81 mg by mouth daily. At nighttime   [provider]  Active Self  b complex vitamins capsule [id number]  Take 1 capsule by mouth daily. [provider]  Active   BAYER CONTOUR TEST test strip [id number]  USE AS DIRECTED Perri Ronal PARAS, MD  Active Self  benzonatate  (TESSALON ) 100 MG capsule [id number]  Take 1-2 capsules (100-200 mg total) by mouth 3 (three) times daily as needed. Burnette, Jennifer M, PA-C  Active   cholecalciferol (VITAMIN D ) 1000 UNITS tablet [id number]  Take 1,000 Units by mouth daily.  [provider]  Active Self  clonazePAM  (KLONOPIN ) 0.5 MG tablet [id number]  TAKE 1 TABLET(0.5 MG) BY MOUTH AT BEDTIME AS NEEDED FOR SLEEP Baxley, Ronal PARAS, MD  Active   Cyanocobalamin (B-12 PO) [id number]  Take by mouth. [provider]  Active   cyclobenzaprine  (FLEXERIL ) 10 MG tablet [id number]  TAKE 1 TABLET(10 MG) BY MOUTH AT BEDTIME Perri Ronal PARAS, MD  Active   Fingerstix Lancets MISC [id number]  Check glucose 4-5 TIMES a day. DISPENSE Microlet Colored Lancets Baxley, Ronal PARAS, MD  Active   [facility] EXTERNAL  MEDICATION [id number]  Vitamin E [provider]  Active   glucose blood (CONTOUR NEXT TEST) test strip [id number]  Check glucose levels 4-5 times daily Perri Ronal PARAS, MD  Active   glucose blood test strip [id number]  Check glucose 4-5 a day. DISPENSE BAYER TEST STRIPS AND LANCETS Perri Ronal PARAS, MD  Active   ibuprofen  (ADVIL ) 800 MG tablet [id number]  Take 1 tablet (800 mg total) by mouth every 8 (eight) hours as needed. Perri Ronal PARAS, MD  Active   Multiple Vitamin (MULTIVITAMIN WITH MINERALS) TABS tablet [id number]  Take 1 tablet by mouth daily. [provider]  Active Self  omeprazole  (PRILOSEC) 20 MG capsule [id number]  TAKE 1 CAPSULE(20 MG) BY MOUTH DAILY Baxley, Ronal PARAS, MD  Active   potassium chloride  SA (KLOR-CON  M) 20 MEQ tablet [id number]  Take 1 tablet (20 mEq total) by mouth daily. Perri Ronal PARAS, MD  Active   rosuvastatin  (CRESTOR ) 10 MG tablet [id number]  TAKE 1 TABLET(10 MG) BY MOUTH DAILY Baxley, Ronal PARAS, MD  Active   tirzepatide  (MOUNJARO ) 2.5 MG/0.5ML Pen [id number]  Inject 2.5 mg into the skin once a week. Perri Ronal PARAS, MD  Active   valACYclovir  (VALTREX ) 500 MG tablet [id number]  TAKE 1 TABLET(500 MG) BY MOUTH TWICE DAILY Baxley, Ronal PARAS, MD  Active  valsartan -hydrochlorothiazide  (DIOVAN -HCT) 160-25 MG tablet [id number]  TAKE 1 TABLET BY MOUTH EVERY DAY Baxley, Ronal PARAS, MD  Active             Recommendation:   Specialty provider follow-up on 02/14/24 at 2:00 PM with  NEW PATIENT [[id number]] Copay: $0.00   Provider: Dartha Ernst, MD Department: [facility]END[facility]   Follow Up Plan:   Telephone follow up appointment date/time:  Wednesday, November 12 at 09:30 AM  Clayborne Ly RN BSN CCM [place]  [facility], [facility] Nurse Care Coordinator  Direct Dial: [phone] Website: [email or web address]

## 2024-02-14 ENCOUNTER — Encounter: Payer: Self-pay | Admitting: "Endocrinology

## 2024-02-14 ENCOUNTER — Ambulatory Visit (INDEPENDENT_AMBULATORY_CARE_PROVIDER_SITE_OTHER): Admitting: "Endocrinology

## 2024-02-14 VITALS — BP 122/70 | HR 74 | Ht 61.75 in | Wt 169.0 lb

## 2024-02-14 DIAGNOSIS — Z7985 Long-term (current) use of injectable non-insulin antidiabetic drugs: Secondary | ICD-10-CM

## 2024-02-14 DIAGNOSIS — E119 Type 2 diabetes mellitus without complications: Secondary | ICD-10-CM

## 2024-02-14 DIAGNOSIS — E7800 Pure hypercholesterolemia, unspecified: Secondary | ICD-10-CM

## 2024-02-14 MED ORDER — TIRZEPATIDE 5 MG/0.5ML ~~LOC~~ SOAJ: 5.0000 mg | SUBCUTANEOUS | 1 refills | Status: AC

## 2024-02-14 NOTE — Progress Notes (Signed)
 Outpatient Endocrinology Note Carla Birmingham, MD  02/14/24   Carla Oconnor 1951/02/05 [id number]  Referring Provider: Perri Ronal PARAS, MD Primary Care Provider: Perri Ronal PARAS, MD Reason for consultation: Subjective   Assessment & Plan  Diagnoses and all orders for this visit:  Controlled type 2 diabetes mellitus without complication (HCC)  Long-term (current) use of injectable non-insulin  antidiabetic drugs  Pure hypercholesterolemia  Other orders -     tirzepatide  (MOUNJARO ) 5 MG/0.5ML Pen; Inject 5 mg into the skin once a week.    Diabetes Type II with no complications, No results found for: GFR Hba1c goal less than 7, current Hba1c is  Lab Results  Component Value Date   HGBA1C 6.1 (H) 02/01/2024   Will recommend the following: Mounjaro  5mg /week  No known contraindications/side effects to any of above medications No history of MEN syndrome/medullary thyroid  cancer/pancreatitis or pancreatic cancer in self or family  -Last LD and Tg are as follows: Lab Results  Component Value Date   LDLCALC 103 (H) 10/31/2023    Lab Results  Component Value Date   TRIG 124 10/31/2023   -On rosuvastatin  10 mg QD -Follow low fat diet and exercise   -Blood pressure goal <140/90 - Microalbumin/creatinine goal is < 30 -Last MA/Cr is as follows: Lab Results  Component Value Date   MICROALBUR 0.9 11/02/2023   -on ACE/ARB valsartan   -diet changes including salt restriction -limit eating outside -counseled BP targets per standards of diabetes care -uncontrolled blood pressure can lead to retinopathy, nephropathy and cardiovascular and atherosclerotic heart disease  Reviewed and counseled on: -A1C target -Blood sugar targets -Complications of uncontrolled diabetes  -Checking blood sugar before meals and bedtime and bring log next visit -All medications with mechanism of action and side effects -Hypoglycemia management: rule of 15's, Glucagon Emergency Kit and  medical alert ID -low-carb low-fat plate-method diet -At least 20 minutes of physical activity per day -Annual dilated retinal eye exam and foot exam -compliance and follow up needs -follow up as scheduled or earlier if problem gets worse  Call if blood sugar is less than 70 or consistently above 250    Take a 15 gm snack of carbohydrate at bedtime before you go to sleep if your blood sugar is less than 100.    If you are going to fast after midnight for a test or procedure, ask your physician for instructions on how to reduce/decrease your insulin  dose.    Call if blood sugar is less than 70 or consistently above 250  -Treating a low sugar by rule of 15  (15 gms of sugar every 15 min until sugar is more than 70) If you feel your sugar is low, test your sugar to be sure If your sugar is low (less than 70), then take 15 grams of a fast acting Carbohydrate (3-4 glucose tablets or glucose gel or 4 ounces of juice or regular soda) Recheck your sugar 15 min after treating low to make sure it is more than 70 If sugar is still less than 70, treat again with 15 grams of carbohydrate          Don't drive the hour of hypoglycemia  If unconscious/unable to eat or drink by mouth, use glucagon injection or nasal spray baqsimi and call 911. Can repeat again in 15 min if still unconscious.  No follow-ups on file.   I have reviewed current medications, nurse's notes, allergies, vital signs, past medical and surgical history, family medical history,  and social history for this encounter. Counseled patient on symptoms, examination findings, lab findings, imaging results, treatment decisions and monitoring and prognosis. The patient understood the recommendations and agrees with the treatment plan. All questions regarding treatment plan were fully answered.  Carla Birmingham, MD  02/14/24  History of Present Illness Carla Oconnor is a 73 y.o. year old female who presents for evaluation of Type II  diabetes mellitus.  Carla Oconnor was first diagnosed around 2015 Diabetes education +  Home diabetes regimen: Mounjaro  2.5mg /week  COMPLICATIONS -  MI/Stroke -  retinopathy -  neuropathy -  nephropathy  SYMPTOMS REVIEWED - Polyuria - Weight loss - Blurred vision  BLOOD SUGAR DATA Checks BID Range: 91-112  Physical Exam  BP 122/70   Pulse 74   Ht 5' 1.75 (1.568 m)   Wt 169 lb (76.7 kg)   SpO2 96%   BMI 31.16 kg/m    Constitutional: well developed, well nourished Head: normocephalic, atraumatic Eyes: sclera anicteric, no redness Neck: supple Lungs: normal respiratory effort Neurology: alert and oriented Skin: dry, no appreciable rashes Musculoskeletal: no appreciable defects Psychiatric: normal mood and affect Diabetic Foot Exam - Simple   No data filed      Current Medications Patient's Medications  New Prescriptions   TIRZEPATIDE  (MOUNJARO ) 5 MG/0.5ML PEN    Inject 5 mg into the skin once a week.  Previous Medications   ALBUTEROL  (VENTOLIN  HFA) 108 (90 BASE) MCG/ACT INHALER    Inhale 2 puffs into the lungs every 6 (six) hours as needed for wheezing or shortness of breath.   ASPIRIN  81 MG TABLET    Take 81 mg by mouth 2 (two) times daily.    B COMPLEX VITAMINS CAPSULE    Take 1 capsule by mouth daily.   BAYER CONTOUR TEST TEST STRIP    USE AS DIRECTED   BENZONATATE  (TESSALON ) 100 MG CAPSULE    Take 1-2 capsules (100-200 mg total) by mouth 3 (three) times daily as needed.   CHOLECALCIFEROL (VITAMIN D ) 1000 UNITS TABLET    Take 1,000 Units by mouth daily.    CLONAZEPAM  (KLONOPIN ) 0.5 MG TABLET    TAKE 1 TABLET(0.5 MG) BY MOUTH AT BEDTIME AS NEEDED FOR SLEEP   CYANOCOBALAMIN (B-12 PO)    Take by mouth.   CYCLOBENZAPRINE  (FLEXERIL ) 10 MG TABLET    TAKE 1 TABLET(10 MG) BY MOUTH AT BEDTIME   FINGERSTIX LANCETS MISC    Check glucose 4-5 TIMES a day. DISPENSE Microlet Colored Lancets   GENERIC EXTERNAL MEDICATION    Vitamin E   GLUCOSE BLOOD (CONTOUR NEXT  TEST) TEST STRIP    Check glucose levels 4-5 times daily   GLUCOSE BLOOD TEST STRIP    Check glucose 4-5 a day. DISPENSE BAYER TEST STRIPS AND LANCETS   IBUPROFEN  (ADVIL ) 800 MG TABLET    Take 1 tablet (800 mg total) by mouth every 8 (eight) hours as needed.   MULTIPLE VITAMIN (MULTIVITAMIN WITH MINERALS) TABS TABLET    Take 1 tablet by mouth daily.   OMEPRAZOLE  (PRILOSEC) 20 MG CAPSULE    TAKE 1 CAPSULE(20 MG) BY MOUTH DAILY   POTASSIUM CHLORIDE  SA (KLOR-CON  M) 20 MEQ TABLET    Take 1 tablet (20 mEq total) by mouth daily.   ROSUVASTATIN  (CRESTOR ) 10 MG TABLET    TAKE 1 TABLET(10 MG) BY MOUTH DAILY   VALACYCLOVIR  (VALTREX ) 500 MG TABLET    TAKE 1 TABLET(500 MG) BY MOUTH TWICE DAILY   VALSARTAN -HYDROCHLOROTHIAZIDE  (DIOVAN -HCT) 160-25 MG  TABLET    TAKE 1 TABLET BY MOUTH EVERY DAY  Modified Medications   No medications on file  Discontinued Medications   TIRZEPATIDE  (MOUNJARO ) 2.5 MG/0.5ML PEN    Inject 2.5 mg into the skin once a week.    Allergies Allergies  Allergen Reactions   Tramadol  Hives and Itching   Percocet [Oxycodone -Acetaminophen ] Itching    Past Medical History Past Medical History:  Diagnosis Date   Asthma    as child and seasonal   Diabetes mellitus without complication (HCC)    Fibrocystic breast    GERD (gastroesophageal reflux disease)    Hyperlipidemia    Hypertension    MRSA infection 2008   right arm   Neuromuscular disorder (HCC)    tennis elbow on right and left arm   S/P hysterectomy     Past Surgical History Past Surgical History:  Procedure Laterality Date   ABDOMINAL HYSTERECTOMY     2003- still has left ovary   BREAST EXCISIONAL BIOPSY     BREAST SURGERY     biopsy rt breast-1960- benign.Left breast 1975  - lumpectomy - benign   CESAREAN SECTION     COLONOSCOPY     KNEE ARTHROSCOPY Right 12/06/2018   Procedure: RIGHT KNEE ARTHROSCOPY WITH SYNOVECTOMY/DEBRIDEMENT;  Surgeon: Vernetta Lonni GRADE, MD;  Location: [facility];   Service: Orthopedics;  Laterality: Right;   TRIGGER FINGER RELEASE  02/16/2012   Procedure: RELEASE TRIGGER FINGER/A-1 PULLEY;  Surgeon: Lamar LULLA Leonor Mickey., MD;  Location: [facility];  Service: Orthopedics;  Laterality: Right;  RIGHT RING FINGER     Family History family history includes Asthma in an other family member; Breast cancer in her sister; Cancer in her sister and another family member; Diabetes in an other family member; Hyperlipidemia in an other family member; Hypertension in an other family member; Stroke in her father.  Social History Social History   Socioeconomic History   Marital status: Married    Spouse name: Not on file   Number of children: Not on file   Years of education: Not on file   Highest education level: Not on file  Occupational History   Not on file  Tobacco Use   Smoking status: Never   Smokeless tobacco: Never  Vaping Use   Vaping status: Never Used  Substance and Sexual Activity   Alcohol use: Yes    Comment: rarely   Drug use: No   Sexual activity: Not Currently  Other Topics Concern   Not on file  Social History Narrative   Social history: She is married.  She has a event organiser.  She is self-employed in case management.  She has one set of twins, boy and girl and also another son.  She lost her nephew during the summer 2019 and a single car accident always [address].  Bad weather.  Her husband is a english as a second language teacher and a [place] veteran.       In the spring 2014 she saw cardiologist regarding chest pain.  She had a negative Cardiolite  study.  She had previous Cardiolite  study in 2009 that was negative.  She had a cardiac cath with Dr. Bulah in 2003.       Family history: Father died at age 11 with stroke.  Mother with history of diabetes and hypertension.  1 brother in fairly good health but has had trouble with crack addiction.  4 sisters.  She has 2 full sisters and 2 half sisters.  1 sister died of kidney  failure with history of breast cancer.  1 sister with history of diabetes and coronary disease.   Social Drivers of Corporate Investment Banker Strain: Low Risk  (04/25/2023)   Overall Financial Resource Strain (CARDIA)    Difficulty of Paying Living Expenses: Not hard at all  Food Insecurity: No Food Insecurity (01/09/2024)   Hunger Vital Sign    Worried About Running Out of Food in the Last Year: Never true    Ran Out of Food in the Last Year: Never true  Transportation Needs: No Transportation Needs (01/09/2024)   PRAPARE - Administrator, Civil Service (Medical): No    Lack of Transportation (Non-Medical): No  Physical Activity: Insufficiently Active (01/09/2024)   Exercise Vital Sign    Days of Exercise per Week: 3 days    Minutes of Exercise per Session: 20 min  Stress: No Stress Concern Present (04/25/2023)   [employer] of Occupational Health - Occupational Stress Questionnaire    Feeling of Stress : Not at all  Social Connections: Socially Integrated (04/25/2023)   Social Connection and Isolation Panel    Frequency of Communication with Friends and Family: More than three times a week    Frequency of Social Gatherings with Friends and Family: More than three times a week    Attends Religious Services: More than 4 times per year    Active Member of Clubs or Organizations: Yes    Attends Banker Meetings: More than 4 times per year    Marital Status: Married  Catering Manager Violence: Not At Risk (01/09/2024)   Humiliation, Afraid, Rape, and Kick questionnaire    Fear of Current or Ex-Partner: No    Emotionally Abused: No    Physically Abused: No    Sexually Abused: No    Lab Results  Component Value Date   HGBA1C 6.1 (H) 02/01/2024   HGBA1C 6.3 (H) 12/07/2023   HGBA1C 6.2 (H) 10/31/2023   Lab Results  Component Value Date   CHOL 176 10/31/2023   Lab Results  Component Value Date   HDL 50 10/31/2023   Lab Results  Component Value Date    LDLCALC 103 (H) 10/31/2023   Lab Results  Component Value Date   TRIG 124 10/31/2023   Lab Results  Component Value Date   CHOLHDL 3.5 10/31/2023   Lab Results  Component Value Date   CREATININE 1.05 (H) 12/07/2023   No results found for: GFR Lab Results  Component Value Date   MICROALBUR 0.9 11/02/2023      Component Value Date/Time   NA 140 12/07/2023 0945   K 4.2 12/07/2023 0945   CL 101 12/07/2023 0945   CO2 31 12/07/2023 0945   GLUCOSE 68 12/07/2023 0945   BUN 21 12/07/2023 0945   CREATININE 1.05 (H) 12/07/2023 0945   CALCIUM  9.9 12/07/2023 0945   PROT 6.8 10/31/2023 0917   ALBUMIN 4.3 09/22/2016 1012   AST 13 10/31/2023 0917   ALT 9 10/31/2023 0917   ALKPHOS 62 09/22/2016 1012   BILITOT 0.6 10/31/2023 0917   GFRNONAA 52 (L) 10/06/2020 0929   GFRAA 60 10/06/2020 0929      Latest Ref Rng & Units 12/07/2023    9:45 AM 11/02/2023   10:06 AM 04/24/2023   11:55 AM  BMP  Glucose 65 - 99 mg/dL 68  84  81   BUN 7 - 25 mg/dL 21  24  25    Creatinine  0.60 - 1.00 mg/dL 8.94  8.86  8.95   BUN/Creat Ratio 6 - 22 (calc) 20  21  24    Sodium 135 - 146 mmol/L 140  141  143   Potassium 3.5 - 5.3 mmol/L 4.2  4.3  4.6   Chloride 98 - 110 mmol/L 101  102  105   CO2 20 - 32 mmol/L 31  30  28    Calcium  8.6 - 10.4 mg/dL 9.9  9.3  9.3        Component Value Date/Time   WBC 4.7 11/02/2023 1006   RBC 3.71 (L) 11/02/2023 1006   HGB 11.0 (L) 11/02/2023 1006   HCT 34.7 (L) 11/02/2023 1006   PLT 307 11/02/2023 1006   MCV 93.5 11/02/2023 1006   MCH 29.6 11/02/2023 1006   MCHC 31.7 (L) 11/02/2023 1006   RDW 12.1 11/02/2023 1006   LYMPHSABS 2,080 04/21/2022 0941   MONOABS 0.5 08/16/2019 1944   EOSABS 99 11/02/2023 1006   BASOSABS 42 11/02/2023 1006     Parts of this note may have been dictated using voice recognition software. There may be variances in spelling and vocabulary which are unintentional. Not all errors are proofread. Please notify the dino if any  discrepancies are noted or if the meaning of any statement is not clear.

## 2024-02-14 NOTE — Patient Instructions (Signed)

## 2024-02-17 DIAGNOSIS — Z7982 Long term (current) use of aspirin: Secondary | ICD-10-CM | POA: Diagnosis not present

## 2024-02-17 DIAGNOSIS — E669 Obesity, unspecified: Secondary | ICD-10-CM | POA: Diagnosis not present

## 2024-02-17 DIAGNOSIS — E1122 Type 2 diabetes mellitus with diabetic chronic kidney disease: Secondary | ICD-10-CM | POA: Diagnosis not present

## 2024-02-17 DIAGNOSIS — F411 Generalized anxiety disorder: Secondary | ICD-10-CM | POA: Diagnosis not present

## 2024-02-17 DIAGNOSIS — J45909 Unspecified asthma, uncomplicated: Secondary | ICD-10-CM | POA: Diagnosis not present

## 2024-02-17 DIAGNOSIS — K219 Gastro-esophageal reflux disease without esophagitis: Secondary | ICD-10-CM | POA: Diagnosis not present

## 2024-02-17 DIAGNOSIS — Z6830 Body mass index (BMI) 30.0-30.9, adult: Secondary | ICD-10-CM | POA: Diagnosis not present

## 2024-02-17 DIAGNOSIS — I129 Hypertensive chronic kidney disease with stage 1 through stage 4 chronic kidney disease, or unspecified chronic kidney disease: Secondary | ICD-10-CM | POA: Diagnosis not present

## 2024-02-17 DIAGNOSIS — Z8249 Family history of ischemic heart disease and other diseases of the circulatory system: Secondary | ICD-10-CM | POA: Diagnosis not present

## 2024-02-17 DIAGNOSIS — E785 Hyperlipidemia, unspecified: Secondary | ICD-10-CM | POA: Diagnosis not present

## 2024-02-19 ENCOUNTER — Encounter: Payer: Self-pay | Admitting: Radiology

## 2024-02-28 ENCOUNTER — Other Ambulatory Visit: Payer: Self-pay

## 2024-02-28 NOTE — Patient Instructions (Signed)
 SABRA

## 2024-02-28 NOTE — Patient Outreach (Signed)
 Complex Care Management   Visit Note  02/28/2024  Name:  Carla Oconnor MRN: [id number] DOB: Oct 09, 1950  Situation: Referral received for Complex Care Management related to type 2 Diabetes, Hypertension, Asthma, Seasonal Allergies, Hyperlipidemia, right knee pain. I obtained verbal consent from Patient.  Visit completed with Patient on the phone.  Background:   Past Medical History:  Diagnosis Date   Asthma    as child and seasonal   Diabetes mellitus without complication (HCC)    Fibrocystic breast    GERD (gastroesophageal reflux disease)    Hyperlipidemia    Hypertension    MRSA infection 2008   right arm   Neuromuscular disorder (HCC)    tennis elbow on right and left arm   S/P hysterectomy     Assessment: Patient Reported Symptoms:  Cognitive Cognitive Status: Alert and oriented to person, place, and time, Normal speech and language skills Cognitive/Intellectual Conditions Management [RPT]: None reported or documented in medical history or problem list   Health Maintenance Behaviors: Annual physical exam, Healthy diet, Immunizations, Sleep adequate Health Facilitated by: Healthy diet, Rest  Neurological Neurological Review of Symptoms: No symptoms reported    HEENT HEENT Symptoms Reported: No symptoms reported      Cardiovascular Cardiovascular Symptoms Reported: No symptoms reported Does patient have uncontrolled Hypertension?: No    Respiratory Respiratory Symptoms Reported: No symptoms reported    Endocrine Endocrine Symptoms Reported: No symptoms reported Is patient diabetic?: Yes Is patient checking blood sugars at home?: Yes List most recent blood sugar readings, include date and time of day: FBS 95 Endocrine Self-Management Outcome: 4 (good)  Gastrointestinal Gastrointestinal Symptoms Reported: Constipation Gastrointestinal Management Strategies: Diet modification, Fluid modification Gastrointestinal Self-Management Outcome: 4 (good)     Genitourinary Genitourinary Symptoms Reported: No symptoms reported    Integumentary Integumentary Symptoms Reported: No symptoms reported    Musculoskeletal Musculoskelatal Symptoms Reviewed: No symptoms reported        Psychosocial Psychosocial Symptoms Reported: No symptoms reported   Major Change/Loss/Stressor/Fears (CP): Denies Quality of Family Relationships: helpful, involved, supportive Do you feel physically threatened by others?: No    02/28/2024    PHQ2-9 Depression Screening   Carla Oconnor interest or pleasure in doing things    Feeling down, depressed, or hopeless    PHQ-2 - Total Score    Trouble falling or staying asleep, or sleeping too much    Feeling tired or having Carla Oconnor energy    Poor appetite or overeating     Feeling bad about yourself - or that you are a failure or have let yourself or your family down    Trouble concentrating on things, such as reading the newspaper or watching television    Moving or speaking so slowly that other people could have noticed.  Or the opposite - being so fidgety or restless that you have been moving around a lot more than usual    Thoughts that you would be better off dead, or hurting yourself in some way    PHQ2-9 Total Score    If you checked off any problems, how difficult have these problems made it for you to do your work, take care of things at home, or get along with other people    Depression Interventions/Treatment      There were no vitals filed for this visit. Pain Scale: Not given for pain  Medications Reviewed Today     Reviewed by Morgan Clayborne CROME, RN (Registered Nurse) on 02/28/24 at [phone]  Med List Status: <  None>   Medication Order Taking? Sig Documenting Provider Last Dose Status Informant  albuterol  (VENTOLIN  HFA) 108 (90 Base) MCG/ACT inhaler [id number]  Inhale 2 puffs into the lungs every 6 (six) hours as needed for wheezing or shortness of breath. Perri Ronal PARAS, MD  Active   aspirin  81 MG tablet [id number]   Take 81 mg by mouth 2 (two) times daily.   Patient taking differently: Take 81 mg by mouth daily. At nighttime   [provider]  Active Self  b complex vitamins capsule [id number]  Take 1 capsule by mouth daily. [provider]  Active   BAYER CONTOUR TEST test strip [id number]  USE AS DIRECTED Perri Ronal PARAS, MD  Active Self  benzonatate  (TESSALON ) 100 MG capsule [id number]  Take 1-2 capsules (100-200 mg total) by mouth 3 (three) times daily as needed. Burnette, Jennifer M, PA-C  Active   cholecalciferol (VITAMIN D ) 1000 UNITS tablet [id number]  Take 1,000 Units by mouth daily.  [provider]  Active Self  clonazePAM  (KLONOPIN ) 0.5 MG tablet [id number]  TAKE 1 TABLET(0.5 MG) BY MOUTH AT BEDTIME AS NEEDED FOR SLEEP Baxley, Ronal PARAS, MD  Active   Cyanocobalamin (B-12 PO) [id number]  Take by mouth. [provider]  Active   cyclobenzaprine  (FLEXERIL ) 10 MG tablet [id number]  TAKE 1 TABLET(10 MG) BY MOUTH AT BEDTIME Perri Ronal PARAS, MD  Active   Fingerstix Lancets MISC [id number]  Check glucose 4-5 TIMES a day. DISPENSE Microlet Colored Lancets Baxley, Ronal PARAS, MD  Active   [facility] EXTERNAL MEDICATION [id number]  Vitamin E [provider]  Active   glucose blood (CONTOUR NEXT TEST) test strip [id number]  Check glucose levels 4-5 times daily Perri Ronal PARAS, MD  Active   glucose blood test strip [id number]  Check glucose 4-5 a day. DISPENSE BAYER TEST STRIPS AND LANCETS Perri Ronal PARAS, MD  Active   ibuprofen  (ADVIL ) 800 MG tablet [id number]  Take 1 tablet (800 mg total) by mouth every 8 (eight) hours as needed. Perri Ronal PARAS, MD  Active   Multiple Vitamin (MULTIVITAMIN WITH MINERALS) TABS tablet [id number]  Take 1 tablet by mouth daily. [provider]  Active Self  omeprazole  (PRILOSEC) 20 MG capsule [id number]  TAKE 1 CAPSULE(20 MG) BY MOUTH DAILY Baxley, Ronal PARAS, MD  Active   potassium chloride  SA (KLOR-CON  M) 20 MEQ tablet [id number]  Take 1 tablet (20 mEq total)  by mouth daily. Perri Ronal PARAS, MD  Active   rosuvastatin  (CRESTOR ) 10 MG tablet [id number]  TAKE 1 TABLET(10 MG) BY MOUTH DAILY Baxley, Ronal PARAS, MD  Active   tirzepatide  (MOUNJARO ) 5 MG/0.5ML Pen [id number]  Inject 5 mg into the skin once a week. Motwani, Komal, MD  Active   valACYclovir  (VALTREX ) 500 MG tablet [id number]  TAKE 1 TABLET(500 MG) BY MOUTH TWICE DAILY Baxley, Ronal PARAS, MD  Active   valsartan -hydrochlorothiazide  (DIOVAN -HCT) 160-25 MG tablet [id number]  TAKE 1 TABLET BY MOUTH EVERY DAY Baxley, Ronal PARAS, MD  Active             Recommendation:   PCP Follow-up 04/29/2024 Status: Sch   Time: 9:30 AM Length: 10  Visit Type: LAB VISIT [8014] Copay: $0.00  Provider: MJB-LAB Department: [facility]   04/30/2024 Status: Sch   Time: 10:00 AM Length: 40  Visit Type: ANNUAL WELL VISIT, SEQUENTIAL [8008] Copay: $0.00  Provider: Perri Ronal PARAS, MD Department: [facility]   Specialty provider follow-up   05/16/2024 Status: Sch  Time: 10:00 AM Length: 20  Visit Type: OFFICE VISIT SPECIALTY [753] Copay: $0.00  Provider: Dartha Ernst, MD Department: [facility]   Follow Up Plan:   Closing From:  Complex Care Management  Clayborne Ly RN BSN CCM [facility]  [facility], [facility] Nurse Care Coordinator  Direct Dial: [phone] Website: [email or web address]

## 2024-02-28 NOTE — Patient Instructions (Signed)
 Visit Information  Thank you for taking time to visit with me today.    Please call 1-800-273-TALK (toll free, 24 hour hotline) if you are experiencing a Mental Health or Behavioral Health Crisis or need someone to talk to.  Clayborne Ly RN BSN CCM [place]  [facility], [facility] Nurse Care Coordinator  Direct Dial: [phone] Website: [email or web address]

## 2024-03-06 ENCOUNTER — Other Ambulatory Visit: Payer: Self-pay

## 2024-03-06 ENCOUNTER — Ambulatory Visit: Admitting: Physician Assistant

## 2024-03-06 DIAGNOSIS — M25561 Pain in right knee: Secondary | ICD-10-CM

## 2024-03-06 DIAGNOSIS — M1711 Unilateral primary osteoarthritis, right knee: Secondary | ICD-10-CM | POA: Diagnosis not present

## 2024-03-06 NOTE — Progress Notes (Signed)
 Office Visit Note   Patient: Carla Oconnor           Date of Birth: 1951-04-14           MRN: [id number] Visit Date: 03/06/2024              Requested by: Perri Ronal PARAS, MD [address] [place],  [place] [address] PCP: Perri Ronal PARAS, MD  Chief Complaint  Patient presents with   Right Knee - Pain      HPI: Patient is a pleasant 73 year old woman who I have seen for arthritis in her right knee before.  She has to had an injection not quite 2 months ago.  She comes in today because she was getting up and she felt pain and popping on the inside of her right knee.  She has had previous arthroscopic surgery with Dr. Vernetta.  She denies any swelling but feels like her knee pops out sometimes.   Assessment & Plan: Visit Diagnoses:  1. Acute pain of right knee   2. Unilateral primary osteoarthritis, right knee     Plan: Findings consistent with a possible degenerative meniscus tear medially.  I explained the natural history of this to her.  Normally I would try an injection but she just had one 6 or 7 weeks ago.  She is willing to try some physical therapy and get a simple pull-up brace.  Dr. Vernetta is her regular surgeon and I asked that before she leaves here today she schedule a follow-up for about a month with me or Gill  Follow-Up Instructions: No follow-ups on file.   Ortho Exam  Patient is alert, oriented, no adenopathy, well-dressed, normal affect, normal respiratory effort. Lamination of her right knee she has no effusion no erythema compartments are soft and compressible she has good flexion and extension strength.  Pulses are intact.  She does have some tenderness over the medial joint line pain.  Good varus valgus stability    Imaging: XR KNEE 3 VIEW RIGHT Result Date: 03/06/2024 Three-view radiographs of her right knee show degenerative changes no acute fractures no differences from previous x-rays significant patellofemoral arthrosis  No images are  attached to the encounter.  Labs: Lab Results  Component Value Date   HGBA1C 6.1 (H) 02/01/2024   HGBA1C 6.3 (H) 12/07/2023   HGBA1C 6.2 (H) 10/31/2023   REPTSTATUS 06/02/2010 FINAL 06/01/2010   CULT  06/01/2010    Multiple bacterial morphotypes present, none predominant. Suggest appropriate recollection if clinically indicated.   LABORGA Multiple bacterial morphotypes present, none 10/29/2013   LABORGA predominant. Suggest appropriate recollection if 10/29/2013   LABORGA clinically indicated. 10/29/2013     Lab Results  Component Value Date   ALBUMIN 4.3 09/22/2016   ALBUMIN 4.2 03/24/2016   ALBUMIN 4.2 01/05/2016    Lab Results  Component Value Date   MG 2.3 10/25/2013   Lab Results  Component Value Date   VD25OH 42 03/30/2017   VD25OH 35 03/24/2015   VD25OH 21 (L) 03/17/2014    No results found for: PREALBUMIN    Latest Ref Rng & Units 11/02/2023   10:06 AM 04/24/2023   11:55 AM 04/21/2022    9:41 AM  CBC EXTENDED  WBC 3.8 - 10.8 Thousand/uL 4.7  4.4  5.2   RBC 3.80 - 5.10 Million/uL 3.71  3.71  3.69   Hemoglobin 11.7 - 15.5 g/dL 88.9  88.7  88.8   HCT 35.0 - 45.0 % 34.7  33.9  33.3  Platelets 140 - 400 Thousand/uL 307  275  268   NEUT# 1,500 - 7,800 cells/uL 2,186  1,989  2,543   Lymph# 850 - 3,900 cells/uL   2,080      There is no height or weight on file to calculate BMI.  Orders:  Orders Placed This Encounter  Procedures   XR KNEE 3 VIEW RIGHT   Ambulatory referral to Physical Therapy   No orders of the defined types were placed in this encounter.    Procedures: No procedures performed  Clinical Data: No additional findings.  ROS:  All other systems negative, except as noted in the HPI. Review of Systems  Objective: Vital Signs: There were no vitals taken for this visit.  Specialty Comments:  No specialty comments available.  PMFS History: Patient Active Problem List   Diagnosis Date Noted   Unilateral primary osteoarthritis,  right knee 03/06/2024   S/P right knee arthroscopy 12/12/2018   Acute medial meniscal tear, right, initial encounter 10/08/2018   Herpes simplex type II infection 05/06/2017   Chronic back pain 10/25/2013   Metabolic syndrome 08/17/2013   Anxiety state 05/28/2012   Insomnia 05/28/2012   Hyperlipidemia 01/17/2011   Hypertension 01/17/2011   GE reflux 01/17/2011   Asthma 01/17/2011   Fibrocystic breast disease 01/17/2011   Diabetes mellitus (HCC) 01/17/2011   Past Medical History:  Diagnosis Date   Asthma    as child and seasonal   Diabetes mellitus without complication (HCC)    Fibrocystic breast    GERD (gastroesophageal reflux disease)    Hyperlipidemia    Hypertension    MRSA infection 2008   right arm   Neuromuscular disorder (HCC)    tennis elbow on right and left arm   S/P hysterectomy     Family History  Problem Relation Age of Onset   Stroke Father    Breast cancer Sister    Cancer Sister        in her shoulder   Asthma Other    Hypertension Other    Cancer Other    Diabetes Other    Hyperlipidemia Other     Past Surgical History:  Procedure Laterality Date   ABDOMINAL HYSTERECTOMY     2003- still has left ovary   BREAST EXCISIONAL BIOPSY     BREAST SURGERY     biopsy rt breast-1960- benign.Left breast 1975  - lumpectomy - benign   CESAREAN SECTION     COLONOSCOPY     KNEE ARTHROSCOPY Right 12/06/2018   Procedure: RIGHT KNEE ARTHROSCOPY WITH SYNOVECTOMY/DEBRIDEMENT;  Surgeon: Vernetta Lonni GRADE, MD;  Location: [facility];  Service: Orthopedics;  Laterality: Right;   TRIGGER FINGER RELEASE  02/16/2012   Procedure: RELEASE TRIGGER FINGER/A-1 PULLEY;  Surgeon: Lamar LULLA Leonor Mickey., MD;  Location: [facility];  Service: Orthopedics;  Laterality: Right;  RIGHT RING FINGER    Social History   Occupational History   Not on file  Tobacco Use   Smoking status: Never   Smokeless tobacco: Never  Vaping Use   Vaping status:  Never Used  Substance and Sexual Activity   Alcohol use: Yes    Comment: rarely   Drug use: No   Sexual activity: Not Currently

## 2024-03-12 ENCOUNTER — Telehealth: Payer: Self-pay

## 2024-03-12 NOTE — Progress Notes (Signed)
   03/12/2024  Patient ID: Carla Oconnor, female   DOB: [birth year], 73 y.o.   MRN: [id number]  This patient is appearing on a report for being at risk of failing the adherence measure for diabetes medications this calendar year.   Medication: metformin  1000mg  Last fill date: 11/09/23 for 60 day supply  Medication stopped- patient is now taking Mounjaro  5mg  weekly with A1c at goal of <7%  Channing DELENA Mealing, PharmD, DPLA

## 2024-03-20 ENCOUNTER — Telehealth

## 2024-03-21 DIAGNOSIS — H04123 Dry eye syndrome of bilateral lacrimal glands: Secondary | ICD-10-CM | POA: Diagnosis not present

## 2024-03-21 DIAGNOSIS — E119 Type 2 diabetes mellitus without complications: Secondary | ICD-10-CM | POA: Diagnosis not present

## 2024-03-21 DIAGNOSIS — H524 Presbyopia: Secondary | ICD-10-CM | POA: Diagnosis not present

## 2024-03-21 DIAGNOSIS — H43392 Other vitreous opacities, left eye: Secondary | ICD-10-CM | POA: Diagnosis not present

## 2024-03-21 DIAGNOSIS — H04563 Stenosis of bilateral lacrimal punctum: Secondary | ICD-10-CM | POA: Diagnosis not present

## 2024-03-23 ENCOUNTER — Other Ambulatory Visit: Payer: Self-pay | Admitting: Internal Medicine

## 2024-03-25 NOTE — Therapy (Incomplete)
 OUTPATIENT PHYSICAL THERAPY LOWER EXTREMITY EVALUATION   Patient Name: Carla Oconnor MRN: [id number] DOB:1951/02/07, 73 y.o., female Today's Date: 03/25/2024  END OF SESSION:   Past Medical History:  Diagnosis Date   Asthma    as child and seasonal   Diabetes mellitus without complication (HCC)    Fibrocystic breast    GERD (gastroesophageal reflux disease)    Hyperlipidemia    Hypertension    MRSA infection 2008   right arm   Neuromuscular disorder (HCC)    tennis elbow on right and left arm   S/P hysterectomy    Past Surgical History:  Procedure Laterality Date   ABDOMINAL HYSTERECTOMY     2003- still has left ovary   BREAST EXCISIONAL BIOPSY     BREAST SURGERY     biopsy rt breast-1960- benign.Left breast 1975  - lumpectomy - benign   CESAREAN SECTION     COLONOSCOPY     KNEE ARTHROSCOPY Right 12/06/2018   Procedure: RIGHT KNEE ARTHROSCOPY WITH SYNOVECTOMY/DEBRIDEMENT;  Surgeon: Vernetta Lonni GRADE, MD;  Location: [facility];  Service: Orthopedics;  Laterality: Right;   TRIGGER FINGER RELEASE  02/16/2012   Procedure: RELEASE TRIGGER FINGER/A-1 PULLEY;  Surgeon: Lamar LULLA Leonor Mickey., MD;  Location: [facility];  Service: Orthopedics;  Laterality: Right;  RIGHT RING FINGER    Patient Active Problem List   Diagnosis Date Noted   Unilateral primary osteoarthritis, right knee 03/06/2024   S/P right knee arthroscopy 12/12/2018   Acute medial meniscal tear, right, initial encounter 10/08/2018   Herpes simplex type II infection 05/06/2017   Chronic back pain 10/25/2013   Metabolic syndrome 08/17/2013   Anxiety state 05/28/2012   Insomnia 05/28/2012   Hyperlipidemia 01/17/2011   Hypertension 01/17/2011   GE reflux 01/17/2011   Asthma 01/17/2011   Fibrocystic breast disease 01/17/2011   Diabetes mellitus (HCC) 01/17/2011    PCP: Ronal Hailstone, MD   REFERRING PROVIDER: Ronal Persons, PA  REFERRING DIAG: [phone] (ICD-10-CM) - Acute  pain of right knee  THERAPY DIAG:  No diagnosis found.  Rationale for Evaluation and Treatment: Rehabilitation  ONSET DATE: ***  SUBJECTIVE:   SUBJECTIVE STATEMENT: ***  PERTINENT HISTORY: *** PAIN:  Are you having pain? Yes: NPRS scale: *** Pain location: *** Pain description: *** Aggravating factors: *** Relieving factors: ***  PRECAUTIONS: {Therapy precautions:24002}  RED FLAGS: {PT Red Flags:29287}   WEIGHT BEARING RESTRICTIONS: No  FALLS:  Has patient fallen in last 6 months? {fallsyesno:27318}  LIVING ENVIRONMENT: Lives with: {[facility] lives with:25569::lives with their family} Lives in: {Lives in:[address]} Stairs: {opstairs:27293} Has following equipment at home: {Assistive devices:23999}  OCCUPATION: ***  PLOF: {PLOF:24004}  PATIENT GOALS: ***  NEXT MD VISIT: ***  OBJECTIVE:  Note: Objective measures were completed at Evaluation unless otherwise noted.  DIAGNOSTIC FINDINGS:  Three-view radiographs of her right knee show degenerative changes no  acute fractures no differences from previous x-rays significant  patellofemoral arthrosis  PATIENT SURVEYS:  PSFS: THE PATIENT SPECIFIC FUNCTIONAL SCALE  Place score of 0-10 (0 = unable to perform activity and 10 = able to perform activity at the same level as before injury or problem)  Activity Date: 03/26/2024         2.     3.     4.      Total Score ***      Total Score = Sum of activity scores/number of activities  Minimally Detectable Change: 3 points (for single activity); 2 points (for average  score)  [facility] (nd). The Patient Specific Functional Scale . Retrieved from [email or web address]com.[email or web address]   COGNITION: Overall cognitive status: {cognition:24006}     SENSATION: {sensation:27233}  EDEMA:  {edema:24020}  MUSCLE LENGTH: Hamstrings: Right *** deg; Left *** deg Debby test: Right *** deg; Left *** deg  POSTURE:  {posture:25561}  PALPATION: ***  LOWER EXTREMITY ROM:  {AROM/PROM:27142} ROM Right Eval 03/26/2024 Left Eval 03/26/2024  Hip flexion    Hip extension    Hip abduction    Hip adduction    Hip internal rotation    Hip external rotation    Knee flexion    Knee extension    Ankle dorsiflexion    Ankle plantarflexion    Ankle inversion    Ankle eversion     (Blank rows = not tested)  LOWER EXTREMITY MMT:  MMT Right Eval 03/26/2024 Left Eval 03/26/2024  Hip flexion    Hip extension    Hip abduction    Hip adduction    Hip internal rotation    Hip external rotation    Knee flexion    Knee extension    Ankle dorsiflexion    Ankle plantarflexion    Ankle inversion    Ankle eversion     (Blank rows = not tested)  LOWER EXTREMITY SPECIAL TESTS:  {LEspecialtests:26242}  FUNCTIONAL TESTS:  {Functional tests:24029}  GAIT: Distance walked: *** Assistive device utilized: {Assistive devices:23999} Level of assistance: {Levels of assistance:24026} Comments: ***                                                                                                                                TREATMENT DATE:  03/26/2024 TherEx:  HEP handout provided with patient performing one set of each activity for appropriate form. Verbal and tactile cues provided.   Self-Care:  POC, ***    PATIENT EDUCATION:  Education details: *** Person educated: {Person educated:25204} Education method: {Education Method:25205} Education comprehension: {Education Comprehension:25206}  HOME EXERCISE PROGRAM: ***  ASSESSMENT:  CLINICAL IMPRESSION: Patient is a 73 y.o. F who was seen today for physical therapy evaluation and treatment for acute Rt knee pain presenting with ***. Patient will benefit from skilled PT to address above noted deficits.    OBJECTIVE IMPAIRMENTS: {opptimpairments:25111}.   ACTIVITY LIMITATIONS: {activitylimitations:27494}  PARTICIPATION LIMITATIONS:  {participationrestrictions:25113}  PERSONAL FACTORS: {Personal factors:25162} are also affecting patient's functional outcome.   REHAB POTENTIAL: {rehabpotential:25112}  CLINICAL DECISION MAKING: {clinical decision making:25114}  EVALUATION COMPLEXITY: {Evaluation complexity:25115}   GOALS: Goals reviewed with patient? Yes  SHORT TERM GOALS: Target date: *** Patient will show compliance with initial HEP. Baseline: Goal status: INITIAL  2.  Patient will report pain levels no greater than ***/10 in order to show an improved overall quality of life. Baseline:  Goal status: INITIAL   LONG TERM GOALS: Target date: ***  Patient will be independent with final HEP in order to maintain and progress upon functional gains made within  PT. Baseline:  Goal status: INITIAL  2.  Patient will report pain levels no greater than ***/10 in order to show an improved overall quality of life. Baseline:  Goal status: INITIAL  3.  Patient will increase PSFS to at least *** in order to show a significant improvement in subjective disability rating. Baseline:  Goal status: INITIAL  4.  *** Baseline:  Goal status: INITIAL  5.  *** Baseline:  Goal status: INITIAL  6.  *** Baseline:  Goal status: INITIAL   PLAN:  PT FREQUENCY: {rehab frequency:25116}  PT DURATION: {rehab duration:25117}  PLANNED INTERVENTIONS: 97164- PT Re-evaluation, 97750- Physical Performance Testing, 97110-Therapeutic exercises, 97530- Therapeutic activity, [id number]- Neuromuscular re-education, 97535- Self Care, 02859- Manual therapy, [id number]- Gait training, [id number]- Orthotic Initial, [id number]- Orthotic/Prosthetic subsequent, [id number]- Canalith repositioning, [id number]- Aquatic Therapy, H9716- Electrical stimulation (unattended), [phone]- Electrical stimulation (manual), [id number]- Vasopneumatic device, [id number]- Ultrasound, [id number]- Traction (mechanical), [id number]- Ionotophoresis 4mg /ml Dexamethasone , 79439 (1-2 muscles), 20561 (3+ muscles)- Dry  Needling, Patient/Family education, Balance training, Stair training, Taping, Joint mobilization, Joint manipulation, Spinal manipulation, Spinal mobilization, Vestibular training, DME instructions, Cryotherapy, and Moist heat  PLAN FOR NEXT SESSION: review HEP, ***   Susannah Daring, PT, DPT 03/25/24 8:28 AM

## 2024-03-26 ENCOUNTER — Ambulatory Visit

## 2024-03-27 ENCOUNTER — Ambulatory Visit

## 2024-04-02 NOTE — Therapy (Incomplete)
 OUTPATIENT PHYSICAL THERAPY LOWER EXTREMITY EVALUATION   Patient Name: Carla Oconnor MRN: [id number] DOB:1950/12/21, 73 y.o., female Today's Date: 04/02/2024  END OF SESSION:   Past Medical History:  Diagnosis Date   Asthma    as child and seasonal   Diabetes mellitus without complication (HCC)    Fibrocystic breast    GERD (gastroesophageal reflux disease)    Hyperlipidemia    Hypertension    MRSA infection 2008   right arm   Neuromuscular disorder (HCC)    tennis elbow on right and left arm   S/P hysterectomy    Past Surgical History:  Procedure Laterality Date   ABDOMINAL HYSTERECTOMY     2003- still has left ovary   BREAST EXCISIONAL BIOPSY     BREAST SURGERY     biopsy rt breast-1960- benign.Left breast 1975  - lumpectomy - benign   CESAREAN SECTION     COLONOSCOPY     KNEE ARTHROSCOPY Right 12/06/2018   Procedure: RIGHT KNEE ARTHROSCOPY WITH SYNOVECTOMY/DEBRIDEMENT;  Surgeon: Vernetta Lonni GRADE, MD;  Location: [facility];  Service: Orthopedics;  Laterality: Right;   TRIGGER FINGER RELEASE  02/16/2012   Procedure: RELEASE TRIGGER FINGER/A-1 PULLEY;  Surgeon: Lamar LULLA Leonor Mickey., MD;  Location: [facility];  Service: Orthopedics;  Laterality: Right;  RIGHT RING FINGER    Patient Active Problem List   Diagnosis Date Noted   Unilateral primary osteoarthritis, right knee 03/06/2024   S/P right knee arthroscopy 12/12/2018   Acute medial meniscal tear, right, initial encounter 10/08/2018   Herpes simplex type II infection 05/06/2017   Chronic back pain 10/25/2013   Metabolic syndrome 08/17/2013   Anxiety state 05/28/2012   Insomnia 05/28/2012   Hyperlipidemia 01/17/2011   Hypertension 01/17/2011   GE reflux 01/17/2011   Asthma 01/17/2011   Fibrocystic breast disease 01/17/2011   Diabetes mellitus (HCC) 01/17/2011    PCP: Ronal Hailstone, MD   REFERRING PROVIDER: Ronal Persons, PA  REFERRING DIAG: [phone] (ICD-10-CM) - Acute  pain of right knee  THERAPY DIAG:  No diagnosis found.  Rationale for Evaluation and Treatment: Rehabilitation  ONSET DATE: ***  SUBJECTIVE:   SUBJECTIVE STATEMENT: ***  PERTINENT HISTORY: *** PAIN:  Are you having pain? Yes: NPRS scale: *** Pain location: *** Pain description: *** Aggravating factors: *** Relieving factors: ***  PRECAUTIONS: {Therapy precautions:24002}  RED FLAGS: {PT Red Flags:29287}   WEIGHT BEARING RESTRICTIONS: No  FALLS:  Has patient fallen in last 6 months? {fallsyesno:27318}  LIVING ENVIRONMENT: Lives with: {[facility] lives with:25569::lives with their family} Lives in: {Lives in:[address]} Stairs: {opstairs:27293} Has following equipment at home: {Assistive devices:23999}  OCCUPATION: ***  PLOF: {PLOF:24004}  PATIENT GOALS: ***  NEXT MD VISIT: ***  OBJECTIVE:  Note: Objective measures were completed at Evaluation unless otherwise noted.  DIAGNOSTIC FINDINGS:  Three-view radiographs of her right knee show degenerative changes no  acute fractures no differences from previous x-rays significant  patellofemoral arthrosis  PATIENT SURVEYS:  PSFS: THE PATIENT SPECIFIC FUNCTIONAL SCALE  Place score of 0-10 (0 = unable to perform activity and 10 = able to perform activity at the same level as before injury or problem)  Activity Date: 04/02/2024         2.     3.     4.      Total Score ***      Total Score = Sum of activity scores/number of activities  Minimally Detectable Change: 3 points (for single activity); 2 points (for average  score)  [facility] (nd). The Patient Specific Functional Scale . Retrieved from [email or web address]   COGNITION: Overall cognitive status: {cognition:24006}     SENSATION: {sensation:27233}  EDEMA:  {edema:24020}  MUSCLE LENGTH: Hamstrings: Right *** deg; Left *** deg Debby test: Right *** deg; Left *** deg  POSTURE:  {posture:25561}  PALPATION: ***  LOWER EXTREMITY ROM:  Active/Passive ROM Right Eval 04/02/2024 Left Eval   Hip flexion ***   Hip extension    Hip abduction    Hip adduction    Hip internal rotation    Hip external rotation    Knee flexion    Knee extension    Ankle dorsiflexion    Ankle plantarflexion    Ankle inversion    Ankle eversion     (Blank rows = not tested)  LOWER EXTREMITY MMT:  MMT Right Eval 04/02/2024 Left Eval   Hip flexion ***   Hip extension    Hip abduction    Hip adduction    Hip internal rotation    Hip external rotation    Knee flexion    Knee extension    Ankle dorsiflexion    Ankle plantarflexion    Ankle inversion    Ankle eversion     (Blank rows = not tested)  LOWER EXTREMITY SPECIAL TESTS:  {LEspecialtests:26242}  FUNCTIONAL TESTS:  5 times sit to stand: ***  GAIT: Distance walked: *** Assistive device utilized: {Assistive devices:23999} Level of assistance: {Levels of assistance:24026} Comments: ***                                                                                                                                TREATMENT DATE:  03/26/2024 TherEx:  HEP handout provided with patient performing one set of each activity for appropriate form. Verbal and tactile cues provided.   Self-Care:  POC, ***    PATIENT EDUCATION:  Education details: HEP Person educated: Patient Education method: Explanation, Demonstration, Tactile cues, Verbal cues, and Handouts Education comprehension: verbalized understanding, returned demonstration, and verbal cues required  HOME EXERCISE PROGRAM: ***  ASSESSMENT:  CLINICAL IMPRESSION: Patient is a 73 y.o. F who was seen today for physical therapy evaluation and treatment for acute Rt knee pain presenting with ***. Patient will benefit from skilled PT to address above noted deficits.    OBJECTIVE IMPAIRMENTS: {opptimpairments:25111}.   ACTIVITY LIMITATIONS:  {activitylimitations:27494}  PARTICIPATION LIMITATIONS: {participationrestrictions:25113}  PERSONAL FACTORS: {Personal factors:25162} are also affecting patient's functional outcome.   REHAB POTENTIAL: {rehabpotential:25112}  CLINICAL DECISION MAKING: Stable/uncomplicated  EVALUATION COMPLEXITY: {Evaluation complexity:25115}   GOALS: Goals reviewed with patient? Yes  SHORT TERM GOALS: Target date: *** Patient will show compliance with initial HEP. Baseline: Goal status: INITIAL  2.  Patient will report pain levels no greater than ***/10 in order to show an improved overall quality of life. Baseline:  Goal status: INITIAL   LONG TERM GOALS: Target date: ***  Patient will be independent with  final HEP in order to maintain and progress upon functional gains made within PT. Baseline:  Goal status: INITIAL  2.  Patient will report pain levels no greater than ***/10 in order to show an improved overall quality of life. Baseline:  Goal status: INITIAL  3.  Patient will increase PSFS to at least *** in order to show a significant improvement in subjective disability rating. Baseline:  Goal status: INITIAL  4.  *** Baseline:  Goal status: INITIAL  5.  *** Baseline:  Goal status: INITIAL  6.  *** Baseline:  Goal status: INITIAL   PLAN:  PT FREQUENCY: {rehab frequency:25116}  PT DURATION: {rehab duration:25117}  PLANNED INTERVENTIONS: 97164- PT Re-evaluation, 97750- Physical Performance Testing, 97110-Therapeutic exercises, 97530- Therapeutic activity, [id number]- Neuromuscular re-education, 97535- Self Care, 02859- Manual therapy, [id number]- Gait training, [id number]- Orthotic Initial, [id number]- Orthotic/Prosthetic subsequent, [id number]- Canalith repositioning, [id number]- Aquatic Therapy, H9716- Electrical stimulation (unattended), [id number]- Electrical stimulation (manual), [id number]- Vasopneumatic device, [id number]- Ultrasound, [id number]- Traction (mechanical), [id number]- Ionotophoresis 4mg /ml Dexamethasone ,  79439 (1-2 muscles), 20561 (3+ muscles)- Dry Needling, Patient/Family education, Balance training, Stair training, Taping, Joint mobilization, Joint manipulation, Spinal manipulation, Spinal mobilization, Vestibular training, DME instructions, Cryotherapy, and Moist heat  PLAN FOR NEXT SESSION: review HEP,   Referring diagnosis? *** Treatment diagnosis? (if different than referring diagnosis) *** What was this (referring dx) caused by? []  Surgery []  Fall []  Ongoing issue []  Arthritis []  Other: ____________  Laterality: []  Rt []  Lt []  Both  Check all possible CPT codes:  *CHOOSE 10 OR LESS*    See Planned Interventions listed in the Plan section of the Evaluation.     Burnard Meth, PT 04/02/2024  9:45 AM

## 2024-04-03 ENCOUNTER — Encounter: Payer: Self-pay | Admitting: Physical Therapy

## 2024-04-03 ENCOUNTER — Ambulatory Visit: Admitting: Physical Therapy

## 2024-04-03 DIAGNOSIS — M25561 Pain in right knee: Secondary | ICD-10-CM | POA: Diagnosis not present

## 2024-04-03 DIAGNOSIS — M6281 Muscle weakness (generalized): Secondary | ICD-10-CM | POA: Diagnosis not present

## 2024-04-03 DIAGNOSIS — Z7409 Other reduced mobility: Secondary | ICD-10-CM | POA: Diagnosis not present

## 2024-04-03 NOTE — Progress Notes (Signed)
 Pharmacy Quality Measure Review  This patient is appearing on a report for being at risk of failing the adherence measure for hypertension (ACEi/ARB) medications this calendar year.   Medication: valsartan -hydrochlorothiazide  160-25 mg Last fill date: 03/25/24 for 90 day supply  Insurance report was not up to date. No action needed at this time.   Jenkins Graces, PharmD PGY1 Pharmacy Resident

## 2024-04-03 NOTE — Therapy (Signed)
 OUTPATIENT PHYSICAL THERAPY LOWER EXTREMITY EVALUATION   Patient Name: Carla Oconnor MRN: [id number] DOB:1951-03-23, 73 y.o., female Today's Date: 04/03/2024  END OF SESSION:  PT End of Session - 04/03/24 1300     Visit Number 1    Number of Visits 6    Date for Recertification  05/15/24    Authorization Type [employer]    PT Start Time 1015    PT Stop Time 1059    PT Time Calculation (min) 44 min    Activity Tolerance Patient tolerated treatment well    Behavior During Therapy WFL for tasks assessed/performed          Past Medical History:  Diagnosis Date   Asthma    as child and seasonal   Diabetes mellitus without complication (HCC)    Fibrocystic breast    GERD (gastroesophageal reflux disease)    Hyperlipidemia    Hypertension    MRSA infection 2008   right arm   Neuromuscular disorder (HCC)    tennis elbow on right and left arm   S/P hysterectomy    Past Surgical History:  Procedure Laterality Date   ABDOMINAL HYSTERECTOMY     2003- still has left ovary   BREAST EXCISIONAL BIOPSY     BREAST SURGERY     biopsy rt breast-1960- benign.Left breast 1975  - lumpectomy - benign   CESAREAN SECTION     COLONOSCOPY     KNEE ARTHROSCOPY Right 12/06/2018   Procedure: RIGHT KNEE ARTHROSCOPY WITH SYNOVECTOMY/DEBRIDEMENT;  Surgeon: Vernetta Lonni GRADE, MD;  Location: [facility];  Service: Orthopedics;  Laterality: Right;   TRIGGER FINGER RELEASE  02/16/2012   Procedure: RELEASE TRIGGER FINGER/A-1 PULLEY;  Surgeon: Lamar LULLA Leonor Mickey., MD;  Location: [facility];  Service: Orthopedics;  Laterality: Right;  RIGHT RING FINGER    Patient Active Problem List   Diagnosis Date Noted   Unilateral primary osteoarthritis, right knee 03/06/2024   S/P right knee arthroscopy 12/12/2018   Acute medial meniscal tear, right, initial encounter 10/08/2018   Herpes simplex type II infection 05/06/2017   Chronic back pain 10/25/2013   Metabolic  syndrome 08/17/2013   Anxiety state 05/28/2012   Insomnia 05/28/2012   Hyperlipidemia 01/17/2011   Hypertension 01/17/2011   GE reflux 01/17/2011   Asthma 01/17/2011   Fibrocystic breast disease 01/17/2011   Diabetes mellitus (HCC) 01/17/2011    PCP: Perri Ronal PARAS, MD  REFERRING PROVIDER: Persons, Ronal Dragon, [place]  REFERRING DIAG: [phone] (ICD-10-CM) - Acute pain of right knee   THERAPY DIAG:  Acute pain of right knee  Muscle weakness (generalized)  Impaired functional mobility, balance, gait, and endurance  Rationale for Evaluation and Treatment: Rehabilitation  ONSET DATE: 03/06/2024  PA referral to PT  SUBJECTIVE:   SUBJECTIVE STATEMENT: This 73yo female with history of OA & right knee arthroscopic surgery (12/06/2018) presented to PA orthopedic office visit with 1 week history of right knee pain 01/11/2024 with no injuries and injection that helped for ~6 weeks. Return visit 03/16/2024 with pain still limiting mobility and PA referred to PT. She walks ~1 mile every other day and does some stretches in morning before gets up.  She has Silver Sneakers but does not use it.  Her shoes were purchased in Spring.   PERTINENT HISTORY: OA, DM2, HTN, HLD  DIAGNOSTIC FINDINGS: X-ray 03/06/2024 Three-view radiographs of her right knee show degenerative changes no acute fractures no differences from previous x-rays significant patellofemoral arthrosis   PAIN:  NPRS scale: in last week  lowest 4/10 & highest 9/10 Pain location: right knee medial knee Pain description: sharp Aggravating factors:  stands up and tries to moves esp in morning.  Walking and balance loss.   Relieving factors: get off leg and elevate foot, ice, tylenol  or ibuprofen .    PRECAUTIONS: None  WEIGHT BEARING RESTRICTIONS: No  FALLS:  Has patient fallen in last 6 months? No  LIVING ENVIRONMENT: Lives with: lives with their spouse Lives in: [address] with full bath upstairs.  Lift for stairs.  Stairs:    12-14 steps to 2nd floor on left.  Single threshold to enter.   Has following equipment at home: None  OCCUPATION: retired engineer, site  PLOF: Independent  PATIENT GOALS:   to function without knee pain.   Next MD visit:  04/08/2024 Dr. Vernetta  OBJECTIVE:   PATIENT SURVEYS:  Patient-Specific Activity Scoring Scheme  0 represents unable to perform. 10 represents able to perform at prior level. 0 1 2 3 4 5 6 7 8 9  10 (Date and Score)  Activity Eval     1. Walking without pain  5    2. Stairs   3    3. Standing including getting up without pain 4   4.    5.    Score 4    Total score = sum of the activity scores/number of activities Minimum detectable change (90%CI) for average score = 2 points Minimum detectable change (90%CI) for single activity score = 3 points  COGNITION: Overall cognitive status: WFL    SENSATION: WFL  EDEMA:  Slight edema medial knee area  POSTURE:  weight shift left  PALPATION: Tenderness right knee medial joint line, patella tendon, hamstring Semimembranous & Semitendinous and popliteal area.    LOWER EXTREMITY ROM:   ROM Right eval  Hip flexion   Hip extension   Hip abduction   Hip adduction   Hip internal rotation   Hip external rotation   Knee flexion Supine A: 114*  Knee extension Supine A: 0*  Ankle dorsiflexion   Ankle plantarflexion   Ankle inversion   Ankle eversion    (Blank rows = not tested)  LOWER EXTREMITY MMT:  MMT Right eval Left eval  Hip flexion 5/5 5/5  Hip extension 4/5   Hip abduction 4+/5   Hip adduction 4-/5   Hip internal rotation 4/5   Hip external rotation 4/5   Knee flexion 4/5   Knee extension 4+/5 5/5  Ankle dorsiflexion 5/5 5/5  Ankle plantarflexion    Ankle inversion    Ankle eversion     (Blank rows = not tested)  FUNCTIONAL TESTS:  Sitting in 18 chair:  due to her height & tibia length, she sits with back of thighs compressed by edge of chair.  She reports  18 inch  chair transfer: patient able to arise without UE assist.   5X sit to stand 23.35 sec Lt SLS: 11.94 sec Rt SLS: 11.14 sec  GAIT: Distance walked: >100'  Assistive device utilized: None Level of assistance: no assistance needed for safety but would benefit from PT education on mechanics.   Comments: RLE externally rotated > LLE during entire gait cycle.  RLE abduction.  TODAY'S TREATMENT                                                                          DATE:  04/03/2024 Therapeutic Exercise: HEP instruction/performance c cues for techniques, handout provided.  Trial set performed of each for comprehension and symptom assessment.  See below for exercise list  PATIENT EDUCATION:  Education details: HEP, POC Person educated: Patient Education method: Explanation, Demonstration, Verbal cues, and Handouts Education comprehension: verbalized understanding, returned demonstration, and verbal cues required  HOME EXERCISE PROGRAM: Access Code: [email or web address] URL: [email or web address] Date: 04/03/2024 Prepared by: Grayce Spatz  Exercises - sit to / from stand using legs   - 1 x daily - 7 x weekly - 2 sets - 10 reps - 5 seconds hold - Seated Active Straight-Leg Raise  - 1 x daily - 7 x weekly - 2 sets - 10 reps - 5 seconds hold - Tandem Stance  - 1 x daily - 7 x weekly - 3 sets - 2 reps - 30 seconds hold  ASSESSMENT:  CLINICAL IMPRESSION: Patient is a 73 y.o. who comes to clinic with complaints of right knee pain with mobility, strength and movement coordination deficits that impair their ability to perform usual daily and recreational functional activities without increase difficulty/symptoms at this time.  Patient to benefit from skilled PT services to address impairments and limitations to improve to  previous level of function without restriction secondary to condition.   OBJECTIVE IMPAIRMENTS: Abnormal gait, decreased activity tolerance, decreased balance, decreased knowledge of condition, decreased strength, and pain.   ACTIVITY LIMITATIONS: carrying, lifting, sitting, standing, stairs, transfers, and locomotion level  PARTICIPATION LIMITATIONS: shopping and community activity  PERSONAL FACTORS: Past/current experiences, Time since onset of injury/illness/exacerbation, and 3+ comorbidities: see PMH are also affecting patient's functional outcome.   REHAB POTENTIAL: Good  CLINICAL DECISION MAKING: Stable/uncomplicated  EVALUATION COMPLEXITY: Low   GOALS: Goals reviewed with patient? Yes  SHORT TERM GOALS: (target date for Short term goals 04/17/2024)   1.  Patient will demonstrate independent use of home exercise program to maintain progress from in clinic treatments.  Goal status: New  LONG TERM GOALS: (target dates for all long term goals  05/15/2024 )   1. Patient will demonstrate/report pain at worst less than or equal to 2/10 to facilitate minimal limitation in daily activity secondary to pain symptoms. Goal status: New   2. Patient will demonstrate independent use of home exercise program to facilitate ability to maintain/progress functional gains from skilled physical therapy services. Goal status: New   3. Patient will demonstrate Patient specific functional scale avg > or = 7 to indicate reduced disability due to condition.  Goal status: New   4.  Patient will demonstrate Right LE MMT 5/5 throughout to faciltiate usual transfers, stairs, squatting at [facility] for daily life.  Goal status: New    PLAN:  PT FREQUENCY: 1x/week  PT DURATION: 5 visits after eval over 6 weeks  PLANNED INTERVENTIONS: 97164- PT Re-evaluation, 97750- Physical Performance Testing, 97110-Therapeutic exercises, 97530- Therapeutic activity, [id number]- Neuromuscular re-education, 97535- Self  Care, 02859- Manual therapy, [id number]- Gait training, [phone]- Electrical stimulation (manual), [id number]- Ultrasound, 02966- Ionotophoresis 4mg /ml Dexamethasone , Patient/Family education, Balance training, Stair training, and Taping  PLAN FOR NEXT SESSION: check & update HEP including functional strength and balance.     Grayce Spatz, PT, DPT 04/03/2024, 4:32 PM  Referring diagnosis? M25.561 (ICD-10-CM) - Acute pain of right knee  Treatment diagnosis? (if different than referring diagnosis) M25.561, M62.81, Z74.09 What was this (referring dx) caused by? []  Surgery []  Fall []  Ongoing issue [x]  Arthritis []  Other: ____________  Laterality: [x]  Rt []  Lt []  Both  Check all possible CPT codes:  *CHOOSE 10 OR LESS*    See Planned Interventions listed in the Plan section of the Evaluation.

## 2024-04-05 LAB — OPHTHALMOLOGY REPORT-SCANNED

## 2024-04-08 ENCOUNTER — Ambulatory Visit: Admitting: Orthopaedic Surgery

## 2024-04-08 ENCOUNTER — Other Ambulatory Visit: Payer: Self-pay | Admitting: Radiology

## 2024-04-08 ENCOUNTER — Encounter: Payer: Self-pay | Admitting: Orthopaedic Surgery

## 2024-04-08 DIAGNOSIS — G8929 Other chronic pain: Secondary | ICD-10-CM

## 2024-04-08 DIAGNOSIS — M1711 Unilateral primary osteoarthritis, right knee: Secondary | ICD-10-CM | POA: Diagnosis not present

## 2024-04-08 DIAGNOSIS — M25561 Pain in right knee: Secondary | ICD-10-CM

## 2024-04-08 NOTE — Progress Notes (Signed)
 The patient is a 73 year old who is now over 5 years out from a right knee arthroscopy with a partial medial meniscectomy.  Over time she has developed worsening right knee pain mainly over the medial aspect the knee and now she has had some locking and catching.  She did have a steroid injection in that right knee about 3 months ago and it did help some.  She is wearing a knee brace and that does give her some security with her knee.  I did review previous x-rays of her right knee and she does have slight varus malalignment with worsening medial compartment narrowing.  Examination of her right knee shows varus malalignment that is correctable.  There is medial joint line tenderness and a positive McMurray's exam to the medial compartment the right knee.  At this point given her continued mechanical symptoms, a new MRI is warranted of the right knee to assess the medial compartment cartilage and the medial meniscus given the failure of all forms conservative treatment for well over a year now.  We will see her back in follow-up once we have the MRI of her right knee.  She agrees with this treatment plan.  She will continue knee sleeve as well and quad strengthening exercises as well as activity modification and over-the-counter anti-inflammatories.

## 2024-04-16 NOTE — Progress Notes (Addendum)
 "  Annual Wellness Visit   Patient Care Team: Phyllis Abelson, Ronal PARAS, MD as PCP - General Delford Maude BROCKS, MD as PCP - Cardiology (Cardiology) Cleatus Collar, MD as Consulting Physician (Ophthalmology)  Visit Date: 04/30/2024   Chief Complaint  Patient presents with   Annual Exam   Medicare Wellness   Subjective:  Patient: Carla Oconnor, Female DOB: Mar 01, 1951, 72 y.o. MRN: [id number] Vitals:   04/30/24 0948  BP: 130/80   Carla Oconnor is a 73 y.o. Female who presents today for her Annual Wellness Visit. Patient has Hyperlipidemia; Hypertension; GE reflux; Asthma; Fibrocystic breast disease; Diabetes mellitus (HCC); Anxiety state; Insomnia; Metabolic syndrome; Chronic back pain; Herpes simplex type II infection; Acute medial meniscal tear, right, initial encounter; S/P right knee arthroscopy; and Unilateral primary osteoarthritis, right knee on their problem list.  History of Hypertension treated with Valsartan -Hydrochlorothiazide  160-25 mg daily. Blood pressure is normal at 130/80.    She is having having pain in her right knee. She is seeing Dr. Lonni poli today for an MRI of her knee. She started physical therapy and had asteroid injection three months ago but says that they did not help. 5 years ago she had a right knee arthroscopy with a partial medial meniscectomy.    Had Vascular US  of LE 03/30/20 with minimal evidence of focal and chronic thrombus visualized in proximal small saphenous vein, but otherwise no DVT, acute superficial venous thrombosis, cystic structure in popliteal fossa, or evidence of common femoral vein obstruction in the left leg.   Had Coronary Calcium  scoring in March 2019 with a score of 3. Saw Dr. Delford, Cardiologist January 2022 and he felt her hypertension was well-controlled. She  had a normal cardiac catheterization in the remote past.    History of Hypokalemia, secondary to diuretic therapy, treated with 20 MEQ Potassium Chloride  SA.     History of Hyperlipidemia treated with Rosuvastatin  10 mg daily. 04/29/2024 lipid panel LDL 110, otherwise WNL.    History of Type 2 Diabetes Mellitus treated with Mounjaro  5 mg injected weekly. She has an appointment with her endocrinologist on the 29th of January.    History of GERD treated with Omeprazole  20 mg daily.    History of Anxiety/Insomnia treated with 0.5 mg Klonopin , mainly at bedtime when she has difficulty falling asleep.     Labs 04/30/2024 RBC 3.63, Hemoglobin 11.0, HCT 33.9, Creatinine 1.02, eGFR 58, LDL 110, Otherwise WNL.   06/28/2023 Mammogram No mammographic evidence of malignancy. Repeat in one year.    06/01/2016 Colonoscopy preparation of the colon was fair. Internal hemorrhoids, The examination was otherwise normal. No specimens collected. Repeat in 10 years.    Vaccine counseling: UTD on influenza vaccine. Health Maintenance  Topic Date Due   Medicare Annual Wellness (AWV)  04/24/2024   HEMOGLOBIN A1C  08/01/2024   Diabetic kidney evaluation - Urine ACR  11/01/2024   FOOT EXAM  11/01/2024   OPHTHALMOLOGY EXAM  04/05/2025   Diabetic kidney evaluation - eGFR measurement  04/29/2025   Mammogram  06/22/2025   Colonoscopy  06/01/2026   DTaP/Tdap/Td (3 - Td or Tdap) 12/22/2032   Pneumococcal Vaccine: 50+ Years  Completed   Influenza Vaccine  Completed   Bone Density Scan  Completed   Zoster Vaccines- Shingrix  Completed   Hepatitis C Screening  Addressed   Meningococcal B Vaccine  Aged Out   COVID-19 Vaccine  Discontinued    Review of Systems  Constitutional:  Negative for fever and malaise/fatigue.  HENT:  Negative for congestion.   Eyes:  Negative for blurred vision.  Respiratory:  Negative for cough and shortness of breath.   Cardiovascular:  Negative for chest pain, palpitations and leg swelling.  Gastrointestinal:  Negative for vomiting.  Musculoskeletal:  Negative for back pain.  Skin:  Negative for rash.  Neurological:  Negative for loss of  consciousness and headaches.   Objective:  Vitals: body mass index is 30.05 kg/m. Today's Vitals   04/30/24 0948  BP: 130/80  Pulse: 68  SpO2: 97%  Weight: 163 lb (73.9 kg)  Height: 5' 1.75 (1.568 m)  PainSc: 5   PainLoc: Knee   Physical Exam Vitals and nursing note reviewed.  Constitutional:      General: She is not in acute distress.    Appearance: Normal appearance. She is not ill-appearing or toxic-appearing.  HENT:     Head: Normocephalic and atraumatic.     Right Ear: Hearing, tympanic membrane, ear canal and external ear normal.     Left Ear: Hearing, tympanic membrane, ear canal and external ear normal.     Mouth/Throat:     Pharynx: Oropharynx is clear.  Eyes:     Extraocular Movements: Extraocular movements intact.     Pupils: Pupils are equal, round, and reactive to light.  Neck:     Thyroid : No thyroid  mass, thyromegaly or thyroid  tenderness.     Vascular: No carotid bruit.  Cardiovascular:     Rate and Rhythm: Normal rate and regular rhythm. No extrasystoles are present.    Pulses:          Dorsalis pedis pulses are 2+ on the right side and 2+ on the left side.     Heart sounds: Normal heart sounds. No murmur heard.    No friction rub. No gallop.  Pulmonary:     Effort: Pulmonary effort is normal.     Breath sounds: Normal breath sounds. No decreased breath sounds, wheezing, rhonchi or rales.  Chest:     Chest wall: No mass.  Abdominal:     Palpations: Abdomen is soft. There is no hepatomegaly, splenomegaly or mass.     Tenderness: There is no abdominal tenderness.     Hernia: No hernia is present.  Genitourinary:    Comments: Pelvic exam deferred.  Musculoskeletal:     Cervical back: Normal range of motion.     Right lower leg: No edema.     Left lower leg: No edema.  Lymphadenopathy:     Cervical: No cervical adenopathy.     Upper Body:     Right upper body: No supraclavicular adenopathy.     Left upper body: No supraclavicular adenopathy.   Skin:    General: Skin is warm and dry.  Neurological:     General: No focal deficit present.     Mental Status: She is alert and oriented to person, place, and time. Mental status is at baseline.     Sensory: Sensation is intact.     Motor: Motor function is intact. No weakness.     Deep Tendon Reflexes: Reflexes are normal and symmetric.  Psychiatric:        Attention and Perception: Attention normal.        Mood and Affect: Mood normal.        Speech: Speech normal.        Behavior: Behavior normal.        Thought Content: Thought content normal.        Cognition and Memory:  Cognition normal.        Judgment: Judgment normal.     Current Outpatient Medications  Medication Instructions   albuterol  (VENTOLIN  HFA) 108 (90 Base) MCG/ACT inhaler 2 puffs, Inhalation, Every 6 hours PRN   aspirin  81 mg, 2 times daily   b complex vitamins capsule 1 capsule, Daily   BAYER CONTOUR TEST test strip USE AS DIRECTED   cholecalciferol (VITAMIN D ) 1,000 Units, Daily   clonazePAM  (KLONOPIN ) 0.5 MG tablet TAKE 1 TABLET(0.5 MG) BY MOUTH AT BEDTIME AS NEEDED FOR SLEEP   Cyanocobalamin (B-12 PO) Take by mouth.   etodolac  (LODINE ) 500 mg, Oral, 2 times daily   Fingerstix Lancets MISC Check glucose 4-5 TIMES a day. DISPENSE Microlet Colored Lancets   GENERIC EXTERNAL MEDICATION Vitamin E   glucose blood (CONTOUR NEXT TEST) test strip Check glucose levels 4-5 times daily   glucose blood test strip Check glucose 4-5 a day. DISPENSE BAYER TEST STRIPS AND LANCETS   ibuprofen  (ADVIL ) 800 mg, Oral, Every 8 hours PRN   Multiple Vitamin (MULTIVITAMIN WITH MINERALS) TABS tablet 1 tablet, Daily   omeprazole  (PRILOSEC) 20 MG capsule TAKE 1 CAPSULE(20 MG) BY MOUTH DAILY   potassium chloride  SA (KLOR-CON  M) 20 MEQ tablet 20 mEq, Oral, Daily   rosuvastatin  (CRESTOR ) 10 MG tablet TAKE 1 TABLET(10 MG) BY MOUTH DAILY   tirzepatide  (MOUNJARO ) 5 mg, Subcutaneous, Weekly   valACYclovir  (VALTREX ) 500 MG tablet TAKE 1  TABLET(500 MG) BY MOUTH TWICE DAILY   valsartan -hydrochlorothiazide  (DIOVAN -HCT) 160-25 MG tablet 1 tablet, Oral, Daily   Past Medical History:  Diagnosis Date   Asthma    as child and seasonal   Diabetes mellitus without complication (HCC)    Fibrocystic breast    GERD (gastroesophageal reflux disease)    Hyperlipidemia    Hypertension    MRSA infection 2008   right arm   Neuromuscular disorder (HCC)    tennis elbow on right and left arm   S/P hysterectomy    Medical/Surgical History Narrative:  Allergic/Intolerant to: Allergies[1]  Past Surgical History:  Procedure Laterality Date   ABDOMINAL HYSTERECTOMY     2003- still has left ovary   BREAST EXCISIONAL BIOPSY     BREAST SURGERY     biopsy rt breast-1960- benign.Left breast 1975  - lumpectomy - benign   CESAREAN SECTION     COLONOSCOPY     KNEE ARTHROSCOPY Right 12/06/2018   Procedure: RIGHT KNEE ARTHROSCOPY WITH SYNOVECTOMY/DEBRIDEMENT;  Surgeon: Vernetta Lonni GRADE, MD;  Location: [facility];  Service: Orthopedics;  Laterality: Right;   TRIGGER FINGER RELEASE  02/16/2012   Procedure: RELEASE TRIGGER FINGER/A-1 PULLEY;  Surgeon: Lamar LULLA Leonor Mickey., MD;  Location: [facility];  Service: Orthopedics;  Laterality: Right;  RIGHT RING FINGER    Family History  Problem Relation Age of Onset   Stroke Father    Breast cancer Sister    Cancer Sister        in her shoulder   Asthma Other    Hypertension Other    Cancer Other    Diabetes Other    Hyperlipidemia Other    Family History Narrative: Father died at age 78 with a Stroke.   Mother with history of Diabetes and Hypertension.   1 brother in fairly good health but has had trouble with Crack Addiction.   4 sisters (2 full sisters and 2 half sisters): 1 sister died of Kidney Failure with history of Breast Cancer. 1 sister with history  of Diabetes and Coronary Artery Disease. Social History   Social History Narrative   Social history:  She is married.  She has a event organiser.  She is self-employed in case management.  She has one set of twins, boy and girl and also another son.  She lost her nephew during the summer 2019 and a single car accident [address].  Bad weather.  Her husband is a english as a second language teacher and a [employer] veteran.       In the spring 2014 she saw cardiologist regarding chest pain.  She had a negative Cardiolite  study.  She had previous Cardiolite  study in 2009 that was negative.  She had a cardiac cath with Dr. Bulah in 2003.       Family history: Father died at age 6 with stroke.  Mother with history of diabetes and hypertension.  1 brother in fairly good health but has had trouble with crack addiction.  4 sisters.  She has 2 full sisters and 2 half sisters.  1 sister died of kidney failure with history of breast cancer.  1 sister with history of diabetes and coronary disease.   Most Recent Health Risks Assessment:   Most Recent Social Determinants of Health (Including Hx of Tobacco, Alcohol, and Drug Use) SDOH Screenings   Food Insecurity: No Food Insecurity (02/28/2024)  Housing: Unknown (02/28/2024)  Transportation Needs: No Transportation Needs (02/28/2024)  Utilities: Not At Risk (02/28/2024)  Alcohol Screen: Low Risk (04/25/2023)  Depression (PHQ2-9): Low Risk (04/30/2024)  Financial Resource Strain: Low Risk (04/25/2023)  Physical Activity: Insufficiently Active (04/30/2024)  Social Connections: Socially Integrated (04/25/2023)  Stress: No Stress Concern Present (04/30/2024)  Tobacco Use: Low Risk (04/30/2024)  Health Literacy: Adequate Health Literacy (04/25/2023)   Social History[2]   Most Recent Fall Risk Assessment:    04/30/2024    9:57 AM  Fall Risk   Falls in the past year? 0  Number falls in past yr: 0  Injury with Fall? 0  Risk for fall due to : No Fall Risks  Follow up Falls prevention discussed;Education provided;Falls evaluation completed   Most Recent  Anxiety/Depression Screenings:    04/30/2024    9:57 AM 01/09/2024   11:45 AM  PHQ 2/9 Scores  PHQ - 2 Score 3 0  PHQ- 9 Score 3       04/30/2024    9:58 AM  GAD 7 : Generalized Anxiety Score  Nervous, Anxious, on Edge 0  Control/stop worrying 0  Worry too much - different things 0  Trouble relaxing 1  Restless 0  Easily annoyed or irritable 0  Afraid - awful might happen 0  Total GAD 7 Score 1  Anxiety Difficulty Not difficult at all   Most Recent Cognitive Screening:    04/25/2023   10:01 AM  6CIT Screen  What Year? 0 points  What month? 0 points  What time? 0 points  Count back from 20 0 points  Months in reverse 0 points  Repeat phrase 0 points  Total Score 0 points   Most Recent Vision/Hearing Screenings:Vision Screening - Comments:: Dr. Cleatus  Results:  Studies Obtained And Personally Reviewed By Me:  06/28/2023 Mammogram No mammographic evidence of malignancy. Repeat in one year.    06/01/2016 Colonoscopy preparation of the colon was fair. Internal hemorrhoids, The examination was otherwise normal. No specimens collected. Repeat in 10 years.   Labs:  CBC w/ Differential Lab Results  Component Value Date   WBC 4.4 04/29/2024  RBC 3.63 (L) 04/29/2024   HGB 11.0 (L) 04/29/2024   HCT 33.9 (L) 04/29/2024   PLT 260 04/29/2024   MCV 93.4 04/29/2024   MCH 30.3 04/29/2024   MCHC 32.4 04/29/2024   RDW 12.5 04/29/2024   MPV 10.0 04/29/2024   LYMPHSABS 2,080 04/21/2022   MONOABS 0.5 08/16/2019   BASOSABS 48 04/29/2024    Comprehensive Metabolic Panel Lab Results  Component Value Date   NA 138 04/29/2024   K 4.1 04/29/2024   CL 100 04/29/2024   CO2 31 04/29/2024   GLUCOSE 97 04/29/2024   BUN 18 04/29/2024   CREATININE 1.02 (H) 04/29/2024   CALCIUM  8.9 04/29/2024   PROT 6.5 04/29/2024   ALBUMIN 4.3 09/22/2016   AST 16 04/29/2024   ALT 10 04/29/2024   ALKPHOS 62 09/22/2016   BILITOT 0.8 04/29/2024   EGFR 58 (L) 04/29/2024   GFRNONAA 52 (L)  10/06/2020   Lipid Panel  Lab Results  Component Value Date   CHOL 183 04/29/2024   HDL 58 04/29/2024   LDLCALC 110 (H) 04/29/2024   TRIG 66 04/29/2024   A1c Lab Results  Component Value Date   HGBA1C 6.1 (H) 02/01/2024    TSH Lab Results  Component Value Date   TSH 2.06 04/29/2024   Assessment & Plan:   Orders Placed This Encounter  Procedures   POCT URINALYSIS DIP (CLINITEK)   Meds ordered this encounter  Medications   etodolac  (LODINE ) 500 MG tablet    Sig: Take 1 tablet (500 mg total) by mouth 2 (two) times daily.    Dispense:  180 tablet    Refill:  1   Hypertension: treated with Valsartan -Hydrochlorothiazide  160-25 mg daily. Blood pressure is normal at 130/80.    Right knee pain: She is having having pain in her right knee. She is seeing Dr. Lonni poli today for an MRI of her knee. She started physical therapy and had a steroid injection three months ago but says that they did not help. 5 years ago she had a right knee arthroscopy with a partial medial meniscectomy.  Etodolac  500 mg twice daily prescribed.    Hypokalemia: secondary to diuretic therapy, treated with 20 MEQ Potassium Chloride  SA.      Type 2 Diabetes Mellitus with hyperlipidemia: Diabetes is being treated with Mounjaro  5 mg injected weekly. She is followed by Dr. Dartha, Endocrinologist. Lipid panel is normal on rosuvastatin  10 mg daily, Hgb AIC in October was 6.1% and will be checked again in January.   GERD: treated with Omeprazole  20 mg daily.    Anxiety/Insomnia: treated with 0.5 mg Klonopin , mainly at bedtime when she has difficulty falling asleep.   06/28/2023 Mammogram No mammographic evidence of malignancy. Repeat in one year.    06/01/2016 Colonoscopy preparation of the colon was fair. Internal hemorrhoids, The examination was otherwise normal. No specimens collected. Repeat in 10 years.    Vaccine counseling: UTD on influenza vaccine.     Annual Wellness Visit done today  including the all of the following: Reviewed patient's Family Medical History Reviewed patient's SDOH and reviewed tobacco, alcohol, and drug use.  Reviewed and updated list of patient's medical providers Assessment of cognitive impairment was done Assessed patient's functional ability Established a written schedule for health screening services Health Risk Assessent Completed and Reviewed  Discussed health benefits of physical activity, and encouraged her to engage in regular exercise appropriate for her age and condition.   I,Makayla C Reid,acting as a neurosurgeon for [employer]  Brynden Thune, MD.,have documented all relevant documentation on the behalf of Ronal JINNY Hailstone, MD,as directed by  Ronal JINNY Hailstone, MD while in the presence of Ronal JINNY Hailstone, MD.  I, Ronal JINNY Hailstone, MD, have reviewed all documentation for and agree with the above Annual Wellness Visit documentation.  Ronal JINNY Hailstone, MD Internal Medicine 04/30/2024     [1]  Allergies Allergen Reactions   Tramadol  Hives and Itching   Percocet [Oxycodone -Acetaminophen ] Itching  [2]  Social History Tobacco Use   Smoking status: Never   Smokeless tobacco: Never  Vaping Use   Vaping status: Never Used  Substance Use Topics   Alcohol use: Yes    Comment: rarely   Drug use: No   "

## 2024-04-17 ENCOUNTER — Encounter: Admitting: Physical Therapy

## 2024-04-24 ENCOUNTER — Telehealth: Payer: Self-pay

## 2024-04-24 ENCOUNTER — Encounter: Payer: Self-pay | Admitting: Orthopaedic Surgery

## 2024-04-24 NOTE — Progress Notes (Signed)
 This patient is appearing on a report for being at risk of failing the adherence measure for diabetes medications this calendar year.  Medication: metformin  1000mg  Last fill date: 11/09/23 for 60 day supply  Medication stopped- patient is now taking Mounjaro  5mg  weekly  (per Dr.Gentry's recent Quality Measure Review). Patient is currently adherent to Mounjaro  with recent fill being on 04/22/24 for 28 days.   Carla Oconnor Student-PharmD

## 2024-04-29 ENCOUNTER — Other Ambulatory Visit: Payer: Self-pay

## 2024-04-29 DIAGNOSIS — Z1329 Encounter for screening for other suspected endocrine disorder: Secondary | ICD-10-CM

## 2024-04-29 DIAGNOSIS — Z0000 Encounter for general adult medical examination without abnormal findings: Secondary | ICD-10-CM

## 2024-04-29 DIAGNOSIS — E1165 Type 2 diabetes mellitus with hyperglycemia: Secondary | ICD-10-CM

## 2024-04-29 DIAGNOSIS — E1169 Type 2 diabetes mellitus with other specified complication: Secondary | ICD-10-CM

## 2024-04-29 DIAGNOSIS — R822 Biliuria: Secondary | ICD-10-CM

## 2024-04-30 ENCOUNTER — Encounter: Payer: Self-pay | Admitting: Internal Medicine

## 2024-04-30 ENCOUNTER — Ambulatory Visit
Admission: RE | Admit: 2024-04-30 | Discharge: 2024-04-30 | Disposition: A | Source: Ambulatory Visit | Attending: Orthopaedic Surgery | Admitting: Orthopaedic Surgery

## 2024-04-30 ENCOUNTER — Ambulatory Visit: Payer: Self-pay | Admitting: Internal Medicine

## 2024-04-30 VITALS — BP 130/80 | HR 68 | Ht 61.75 in | Wt 163.0 lb

## 2024-04-30 DIAGNOSIS — E785 Hyperlipidemia, unspecified: Secondary | ICD-10-CM

## 2024-04-30 DIAGNOSIS — E1169 Type 2 diabetes mellitus with other specified complication: Secondary | ICD-10-CM

## 2024-04-30 DIAGNOSIS — Z0000 Encounter for general adult medical examination without abnormal findings: Secondary | ICD-10-CM | POA: Diagnosis not present

## 2024-04-30 DIAGNOSIS — F419 Anxiety disorder, unspecified: Secondary | ICD-10-CM

## 2024-04-30 DIAGNOSIS — Z7985 Long-term (current) use of injectable non-insulin antidiabetic drugs: Secondary | ICD-10-CM

## 2024-04-30 DIAGNOSIS — Z6830 Body mass index (BMI) 30.0-30.9, adult: Secondary | ICD-10-CM

## 2024-04-30 DIAGNOSIS — K219 Gastro-esophageal reflux disease without esophagitis: Secondary | ICD-10-CM | POA: Diagnosis not present

## 2024-04-30 DIAGNOSIS — E119 Type 2 diabetes mellitus without complications: Secondary | ICD-10-CM

## 2024-04-30 DIAGNOSIS — D649 Anemia, unspecified: Secondary | ICD-10-CM

## 2024-04-30 DIAGNOSIS — R822 Biliuria: Secondary | ICD-10-CM

## 2024-04-30 DIAGNOSIS — I10 Essential (primary) hypertension: Secondary | ICD-10-CM

## 2024-04-30 DIAGNOSIS — M1711 Unilateral primary osteoarthritis, right knee: Secondary | ICD-10-CM

## 2024-04-30 DIAGNOSIS — G4700 Insomnia, unspecified: Secondary | ICD-10-CM | POA: Diagnosis not present

## 2024-04-30 DIAGNOSIS — E876 Hypokalemia: Secondary | ICD-10-CM | POA: Diagnosis not present

## 2024-04-30 LAB — POCT URINALYSIS DIP (CLINITEK)
Blood, UA: NEGATIVE
Glucose, UA: NEGATIVE mg/dL
Ketones, POC UA: NEGATIVE mg/dL
Leukocytes, UA: NEGATIVE
Nitrite, UA: NEGATIVE
POC PROTEIN,UA: NEGATIVE
Spec Grav, UA: 1.01
Urobilinogen, UA: 0.2 U/dL
pH, UA: 6.5

## 2024-04-30 MED ORDER — ETODOLAC 500 MG PO TABS: 500.0000 mg | ORAL_TABLET | Freq: Two times a day (BID) | ORAL | 1 refills | Status: AC

## 2024-04-30 NOTE — Patient Instructions (Addendum)
 Continue current medications. Return in 6 months or as needed. Refill generic Lodine  (NSAID) per pt request.

## 2024-04-30 NOTE — Progress Notes (Addendum)
 "  Chief Complaint  Patient presents with   Annual Exam   Medicare Wellness     Subjective:   Carla Oconnor is a 74 y.o. Female who presents for  [facility] Annual Wellness Visit.  Visit info / Clinical Intake: Medicare Wellness Visit Type:: Subsequent Annual Wellness Visit Persons participating in visit and providing information:: patient Medicare Wellness Visit Mode:: In-person (required for WTM) Interpreter Needed?: No Pre-visit prep was completed: yes AWV questionnaire completed by patient prior to visit?: no Living arrangements:: lives with spouse/significant other Patient's Overall Health Status Rating: very good Typical amount of pain: some Does pain affect daily life?: (!) yes Are you currently prescribed opioids?: no  Dietary Habits and Nutritional Risks How many meals a day?: 3 Eats fruit and vegetables daily?: yes Most meals are obtained by: preparing own meals In the last 2 weeks, have you had any of the following?: none Diabetic:: (!) yes Any non-healing wounds?: no How often do you check your BS?: as needed Would you like to be referred to a Nutritionist or for Diabetic Management? : no  Functional Status Activities of Daily Living (to include ambulation/medication): Independent Ambulation: Independent Medication Administration: Independent Home Management (perform basic housework or laundry): Independent Manage your own finances?: yes Primary transportation is: driving Concerns about vision?: no *vision screening is required for WTM* Concerns about hearing?: no  Fall Screening Falls in the past year?: 0 Number of falls in past year: 0 Was there an injury with Fall?: 0 Fall Risk Category Calculator: 0 Patient Fall Risk Level: Low Fall Risk  Fall Risk Patient at Risk for Falls Due to: No Fall Risks Fall risk Follow up: Falls prevention discussed; Education provided; Falls evaluation completed  Home and Transportation Safety: All rugs have non-skid  backing?: yes All stairs or steps have railings?: yes Grab bars in the bathtub or shower?: yes Have non-skid surface in bathtub or shower?: yes Good home lighting?: yes Regular seat belt use?: yes Hospital stays in the last year:: no  Cognitive Assessment Difficulty concentrating, remembering, or making decisions? : no Will 6CIT or Mini Cog be Completed: no 6CIT or Mini Cog Declined: patient alert, oriented, able to answer questions appropriately and recall recent events  Advance Directives (For Healthcare) Does Patient Have a Medical Advance Directive?: No Type of Advance Directive: Living will; Healthcare Power of Attorney Copy of Healthcare Power of Attorney in Chart?: No - copy requested Copy of Living Will in Chart?: No - copy requested Would patient like information on creating a medical advance directive?: No - Patient declined  Reviewed/Updated  Reviewed/Updated: Reviewed All (Medical, Surgical, Family, Medications, Allergies, Care Teams, Patient Goals)    Allergies (verified) Tramadol  and Percocet [oxycodone -acetaminophen ]   Current Medications (verified) Outpatient Encounter Medications as of 04/30/2024  Medication Sig   albuterol  (VENTOLIN  HFA) 108 (90 Base) MCG/ACT inhaler Inhale 2 puffs into the lungs every 6 (six) hours as needed for wheezing or shortness of breath.   aspirin  81 MG tablet Take 81 mg by mouth 2 (two) times daily.  (Patient taking differently: Take 81 mg by mouth daily. At nighttime)   b complex vitamins capsule Take 1 capsule by mouth daily.   BAYER CONTOUR TEST test strip USE AS DIRECTED   cholecalciferol (VITAMIN D ) 1000 UNITS tablet Take 1,000 Units by mouth daily.    clonazePAM  (KLONOPIN ) 0.5 MG tablet TAKE 1 TABLET(0.5 MG) BY MOUTH AT BEDTIME AS NEEDED FOR SLEEP   Cyanocobalamin (B-12 PO) Take by mouth.   etodolac  (  LODINE ) 500 MG tablet Take 1 tablet (500 mg total) by mouth 2 (two) times daily.   Fingerstix Lancets MISC Check glucose 4-5 TIMES  a day. DISPENSE Microlet Colored Lancets   GENERIC EXTERNAL MEDICATION Vitamin E   glucose blood (CONTOUR NEXT TEST) test strip Check glucose levels 4-5 times daily   glucose blood test strip Check glucose 4-5 a day. DISPENSE BAYER TEST STRIPS AND LANCETS   ibuprofen  (ADVIL ) 800 MG tablet Take 1 tablet (800 mg total) by mouth every 8 (eight) hours as needed.   Multiple Vitamin (MULTIVITAMIN WITH MINERALS) TABS tablet Take 1 tablet by mouth daily.   omeprazole  (PRILOSEC) 20 MG capsule TAKE 1 CAPSULE(20 MG) BY MOUTH DAILY   potassium chloride  SA (KLOR-CON  M) 20 MEQ tablet Take 1 tablet (20 mEq total) by mouth daily.   rosuvastatin  (CRESTOR ) 10 MG tablet TAKE 1 TABLET(10 MG) BY MOUTH DAILY   tirzepatide  (MOUNJARO ) 5 MG/0.5ML Pen Inject 5 mg into the skin once a week.   valACYclovir  (VALTREX ) 500 MG tablet TAKE 1 TABLET(500 MG) BY MOUTH TWICE DAILY   valsartan -hydrochlorothiazide  (DIOVAN -HCT) 160-25 MG tablet TAKE 1 TABLET BY MOUTH EVERY DAY   [DISCONTINUED] benzonatate  (TESSALON ) 100 MG capsule Take 1-2 capsules (100-200 mg total) by mouth 3 (three) times daily as needed.   [DISCONTINUED] cyclobenzaprine  (FLEXERIL ) 10 MG tablet TAKE 1 TABLET(10 MG) BY MOUTH AT BEDTIME   No facility-administered encounter medications on file as of 04/30/2024.    History: Past Medical History:  Diagnosis Date   Asthma    as child and seasonal   Diabetes mellitus without complication (HCC)    Fibrocystic breast    GERD (gastroesophageal reflux disease)    Hyperlipidemia    Hypertension    MRSA infection 2008   right arm   Neuromuscular disorder (HCC)    tennis elbow on right and left arm   S/P hysterectomy    Past Surgical History:  Procedure Laterality Date   ABDOMINAL HYSTERECTOMY     2003- still has left ovary   BREAST EXCISIONAL BIOPSY     BREAST SURGERY     biopsy rt breast-1960- benign.Left breast 1975  - lumpectomy - benign   CESAREAN SECTION     COLONOSCOPY     KNEE ARTHROSCOPY Right  12/06/2018   Procedure: RIGHT KNEE ARTHROSCOPY WITH SYNOVECTOMY/DEBRIDEMENT;  Surgeon: Vernetta Lonni GRADE, MD;  Location: [facility];  Service: Orthopedics;  Laterality: Right;   TRIGGER FINGER RELEASE  02/16/2012   Procedure: RELEASE TRIGGER FINGER/A-1 PULLEY;  Surgeon: Lamar LULLA Leonor Mickey., MD;  Location: [facility];  Service: Orthopedics;  Laterality: Right;  RIGHT RING FINGER    Family History  Problem Relation Age of Onset   Stroke Father    Breast cancer Sister    Cancer Sister        in her shoulder   Asthma Other    Hypertension Other    Cancer Other    Diabetes Other    Hyperlipidemia Other    Social History   Occupational History   Not on file  Tobacco Use   Smoking status: Never   Smokeless tobacco: Never  Vaping Use   Vaping status: Never Used  Substance and Sexual Activity   Alcohol use: Yes    Comment: rarely   Drug use: No   Sexual activity: Not Currently   Tobacco Counseling Counseling given: No  SDOH Screenings   Food Insecurity: No Food Insecurity (04/30/2024)  Housing: Low Risk (04/30/2024)  Transportation Needs: No Transportation Needs (04/30/2024)  Utilities: Not At Risk (04/30/2024)  Alcohol Screen: Low Risk (04/30/2024)  Depression (PHQ2-9): Low Risk (04/30/2024)  Financial Resource Strain: Low Risk (04/30/2024)  Physical Activity: Insufficiently Active (04/30/2024)  Social Connections: Socially Integrated (04/30/2024)  Stress: No Stress Concern Present (04/30/2024)  Tobacco Use: Low Risk (04/30/2024)  Health Literacy: Adequate Health Literacy (04/30/2024)   See flowsheets for full screening details  Depression Screen PHQ 2 & 9 Depression Scale- Over the past 2 weeks, how often have you been bothered by any of the following problems? Little interest or pleasure in doing things: 3 Feeling down, depressed, or hopeless (PHQ Adolescent also includes...irritable): 0 PHQ-2 Total Score: 3 Trouble falling or staying asleep,  or sleeping too much: 0 Feeling tired or having little energy: 0 Poor appetite or overeating (PHQ Adolescent also includes...weight loss): 0 Feeling bad about yourself - or that you are a failure or have let yourself or your family down: 0 Trouble concentrating on things, such as reading the newspaper or watching television (PHQ Adolescent also includes...like school work): 0 Moving or speaking so slowly that other people could have noticed. Or the opposite - being so fidgety or restless that you have been moving around a lot more than usual: 0 Thoughts that you would be better off dead, or of hurting yourself in some way: 0 PHQ-9 Total Score: 3 If you checked off any problems, how difficult have these problems made it for you to do your work, take care of things at home, or get along with other people?: Not difficult at all     Goals Addressed   None          Objective:    Today's Vitals   04/30/24 0948  BP: 130/80  Pulse: 68  SpO2: 97%  Weight: 163 lb (73.9 kg)  Height: 5' 1.75 (1.568 m)  PainSc: 5   PainLoc: Knee   Body mass index is 30.05 kg/m.  Hearing/Vision screen Vision Screening - Comments:: Patient states she is up to date with her yearly eye exams at Dr. Cleatus  Immunizations and Health Maintenance Health Maintenance  Topic Date Due   HEMOGLOBIN A1C  08/01/2024   Diabetic kidney evaluation - Urine ACR  11/01/2024   FOOT EXAM  11/01/2024   OPHTHALMOLOGY EXAM  04/05/2025   Diabetic kidney evaluation - eGFR measurement  04/29/2025   Medicare Annual Wellness (AWV)  04/30/2025   Mammogram  06/22/2025   Colonoscopy  06/01/2026   DTaP/Tdap/Td (3 - Td or Tdap) 12/22/2032   Pneumococcal Vaccine: 50+ Years  Completed   Influenza Vaccine  Completed   Bone Density Scan  Completed   Zoster Vaccines- Shingrix  Completed   Hepatitis C Screening  Addressed   Meningococcal B Vaccine  Aged Out   COVID-19 Vaccine  Discontinued        Assessment/Plan:  This is a  routine wellness examination for Carla Oconnor.  Patient Care Team: Perri Ronal PARAS, MD as PCP - General Delford Maude BROCKS, MD as PCP - Cardiology (Cardiology) Cleatus Collar, MD as Consulting Physician (Ophthalmology)  I have personally reviewed and noted the following in the patients chart:   Medical and social history Use of alcohol, tobacco or illicit drugs  Current medications and supplements including opioid prescriptions. Functional ability and status Nutritional status Physical activity Advanced directives List of other physicians Hospitalizations, surgeries, and ER visits in previous 12 months Vitals Screenings to include cognitive, depression, and falls Referrals and appointments  Orders  Placed This Encounter  Procedures   POCT URINALYSIS DIP (CLINITEK)   In addition, I have reviewed and discussed with patient certain preventive protocols, quality metrics, and best practice recommendations. A written personalized care plan for preventive services as well as general preventive health recommendations were provided to patient.   Araceli Valencia, CMA   04/30/2024   Return in about 6 months (around 10/28/2024).  After Visit Summary: (In Person-Printed) AVS printed and given to the patient  Nurse Notes: none  I, Ronal JINNY Hailstone, MD, have reviewed all documentation for this visit. The documentation on 04/30/2024 for the exam, diagnosis, procedures, and orders are all accurate and complete.  "

## 2024-05-01 ENCOUNTER — Encounter: Admitting: Physical Therapy

## 2024-05-01 NOTE — Therapy (Unsigned)
 " OUTPATIENT PHYSICAL THERAPY LOWER EXTREMITY TREATMENT   Patient Name: Carla Oconnor MRN: [id number] DOB:Sep 25, 1950, 74 y.o., female Today's Date: 05/01/2024  END OF SESSION:    Past Medical History:  Diagnosis Date   Asthma    as child and seasonal   Diabetes mellitus without complication (HCC)    Fibrocystic breast    GERD (gastroesophageal reflux disease)    Hyperlipidemia    Hypertension    MRSA infection 2008   right arm   Neuromuscular disorder (HCC)    tennis elbow on right and left arm   S/P hysterectomy    Past Surgical History:  Procedure Laterality Date   ABDOMINAL HYSTERECTOMY     2003- still has left ovary   BREAST EXCISIONAL BIOPSY     BREAST SURGERY     biopsy rt breast-1960- benign.Left breast 1975  - lumpectomy - benign   CESAREAN SECTION     COLONOSCOPY     KNEE ARTHROSCOPY Right 12/06/2018   Procedure: RIGHT KNEE ARTHROSCOPY WITH SYNOVECTOMY/DEBRIDEMENT;  Surgeon: Vernetta Lonni GRADE, MD;  Location: [facility];  Service: Orthopedics;  Laterality: Right;   TRIGGER FINGER RELEASE  02/16/2012   Procedure: RELEASE TRIGGER FINGER/A-1 PULLEY;  Surgeon: Lamar LULLA Leonor Mickey., MD;  Location: [facility];  Service: Orthopedics;  Laterality: Right;  RIGHT RING FINGER    Patient Active Problem List   Diagnosis Date Noted   Unilateral primary osteoarthritis, right knee 03/06/2024   S/P right knee arthroscopy 12/12/2018   Acute medial meniscal tear, right, initial encounter 10/08/2018   Herpes simplex type II infection 05/06/2017   Chronic back pain 10/25/2013   Metabolic syndrome 08/17/2013   Anxiety state 05/28/2012   Insomnia 05/28/2012   Hyperlipidemia 01/17/2011   Hypertension 01/17/2011   GE reflux 01/17/2011   Asthma 01/17/2011   Fibrocystic breast disease 01/17/2011   Diabetes mellitus (HCC) 01/17/2011    PCP: Perri Ronal PARAS, MD  REFERRING PROVIDER: Persons, Ronal Dragon, [place]  REFERRING DIAG: [phone] (ICD-10-CM)  - Acute pain of right knee   THERAPY DIAG:  No diagnosis found.  Rationale for Evaluation and Treatment: Rehabilitation  ONSET DATE: 03/06/2024  PA referral to PT  SUBJECTIVE:   SUBJECTIVE STATEMENT: 05/01/2024 ***  Evaluation:  This 74yo female with history of OA & right knee arthroscopic surgery (12/06/2018) presented to PA orthopedic office visit with 1 week history of right knee pain 01/11/2024 with no injuries and injection that helped for ~6 weeks. Return visit 03/16/2024 with pain still limiting mobility and PA referred to PT. She walks ~1 mile every other day and does some stretches in morning before gets up.  She has Silver Sneakers but does not use it.  Her shoes were purchased in Spring.   PERTINENT HISTORY: OA, DM2, HTN, HLD  DIAGNOSTIC FINDINGS: X-ray 03/06/2024 Three-view radiographs of her right knee show degenerative changes no acute fractures no differences from previous x-rays significant patellofemoral arthrosis   PAIN:  NPRS scale: in last week  lowest ***  4/10 & highest 9/10 Pain location: right knee medial knee Pain description: sharp Aggravating factors:  stands up and tries to moves esp in morning.  Walking and balance loss.   Relieving factors: get off leg and elevate foot, ice, tylenol  or ibuprofen .    PRECAUTIONS: None  WEIGHT BEARING RESTRICTIONS: No  FALLS:  Has patient fallen in last 6 months? No  LIVING ENVIRONMENT: Lives with: lives with their spouse Lives in: [address] with full bath upstairs.  Lift for stairs.  Stairs:   12-14 steps to 2nd floor on left.  Single threshold to enter.   Has following equipment at home: None  OCCUPATION: retired engineer, site  PLOF: Independent  PATIENT GOALS:   to function without knee pain.   Next MD visit:  04/08/2024 Dr. Vernetta  OBJECTIVE:   PATIENT SURVEYS:  Patient-Specific Activity Scoring Scheme  0 represents unable to perform. 10 represents able to perform at prior level. 0 1 2  3 4 5 6 7 8 9 10  (Date and Score)  Activity Eval     1. Walking without pain  5    2. Stairs   3    3. Standing including getting up without pain 4   4.    5.    Score 4    Total score = sum of the activity scores/number of activities Minimum detectable change (90%CI) for average score = 2 points Minimum detectable change (90%CI) for single activity score = 3 points  COGNITION: Overall cognitive status: WFL    SENSATION: WFL  EDEMA:  Slight edema medial knee area  POSTURE:  weight shift left  PALPATION: Tenderness right knee medial joint line, patella tendon, hamstring Semimembranous & Semitendinous and popliteal area.    LOWER EXTREMITY ROM:   ROM Right eval  Hip flexion   Hip extension   Hip abduction   Hip adduction   Hip internal rotation   Hip external rotation   Knee flexion Supine A: 114*  Knee extension Supine A: 0*  Ankle dorsiflexion   Ankle plantarflexion   Ankle inversion   Ankle eversion    (Blank rows = not tested)  LOWER EXTREMITY MMT:  MMT Right eval Left eval  Hip flexion 5/5 5/5  Hip extension 4/5   Hip abduction 4+/5   Hip adduction 4-/5   Hip internal rotation 4/5   Hip external rotation 4/5   Knee flexion 4/5   Knee extension 4+/5 5/5  Ankle dorsiflexion 5/5 5/5  Ankle plantarflexion    Ankle inversion    Ankle eversion     (Blank rows = not tested)  FUNCTIONAL TESTS:  Sitting in 18 chair:  due to her height & tibia length, she sits with back of thighs compressed by edge of chair.  She reports  18 inch chair transfer: patient able to arise without UE assist.   5X sit to stand 23.35 sec Lt SLS: 11.94 sec Rt SLS: 11.14 sec  GAIT: Distance walked: >100'  Assistive device utilized: None Level of assistance: no assistance needed for safety but would benefit from PT education on mechanics.   Comments: RLE externally rotated > LLE during entire gait cycle.  RLE abduction.  TODAY'S TREATMENT                                                                          DATE:  05/01/2024 Therapeutic Exercise: ***  HEP instruction/performance c cues for techniques, handout provided.  Trial set performed of each for comprehension and symptom assessment.  See below for exercise list   TREATMENT                                                                          DATE:  04/03/2024 Therapeutic Exercise: HEP instruction/performance c cues for techniques, handout provided.  Trial set performed of each for comprehension and symptom assessment.  See below for exercise list  PATIENT EDUCATION:  Education details: HEP, POC Person educated: Patient Education method: Explanation, Demonstration, Verbal cues, and Handouts Education comprehension: verbalized understanding, returned demonstration, and verbal cues required  HOME EXERCISE PROGRAM: Access Code: [email or web address] URL: [email or web address] Date: 04/03/2024 Prepared by: Grayce Spatz  Exercises - sit to / from stand using legs   - 1 x daily - 7 x weekly - 2 sets - 10 reps - 5 seconds hold - Seated Active Straight-Leg Raise  - 1 x daily - 7 x weekly - 2 sets - 10 reps - 5 seconds hold - Tandem Stance  - 1 x daily - 7 x weekly - 3 sets - 2 reps - 30 seconds hold  ASSESSMENT:  CLINICAL IMPRESSION: ***  Patient is a 74 y.o. who comes to clinic with complaints of right knee pain with mobility, strength and movement coordination deficits that impair their ability to perform usual daily and recreational functional activities without increase difficulty/symptoms at this time.  Patient to benefit from skilled PT services to address impairments and limitations to improve to previous level of function without restriction secondary to condition.   OBJECTIVE IMPAIRMENTS: Abnormal gait, decreased  activity tolerance, decreased balance, decreased knowledge of condition, decreased strength, and pain.   ACTIVITY LIMITATIONS: carrying, lifting, sitting, standing, stairs, transfers, and locomotion level  PARTICIPATION LIMITATIONS: shopping and community activity  PERSONAL FACTORS: Past/current experiences, Time since onset of injury/illness/exacerbation, and 3+ comorbidities: see PMH are also affecting patient's functional outcome.   REHAB POTENTIAL: Good  CLINICAL DECISION MAKING: Stable/uncomplicated  EVALUATION COMPLEXITY: Low   GOALS: Goals reviewed with patient? Yes  SHORT TERM GOALS: (target date for Short term goals 04/17/2024)   1.  Patient will demonstrate independent use of home exercise program to maintain progress from in clinic treatments. Goal status: Ongoing  05/01/2024  LONG TERM GOALS: (target dates for all long term goals  05/15/2024 )   1. Patient will demonstrate/report pain at worst less than or equal to 2/10 to facilitate minimal limitation in daily activity secondary to pain symptoms. Goal status:   Ongoing  05/01/2024   2. Patient will demonstrate independent use of home exercise program to facilitate ability to maintain/progress functional gains from skilled physical therapy services. Goal status:   Ongoing  05/01/2024   3. Patient will demonstrate Patient specific functional scale avg > or = 7 to indicate reduced disability due to condition.  Goal status:  Ongoing  05/01/2024   4.  Patient will demonstrate Right LE MMT 5/5 throughout to faciltiate usual transfers, stairs, squatting at [facility] for daily life.  Goal status:   Ongoing  05/01/2024    PLAN:  PT FREQUENCY: 1x/week  PT DURATION: 5 visits after eval over 6 weeks  PLANNED INTERVENTIONS: [id number]- PT Re-evaluation, 97750- Physical Performance Testing, 97110-Therapeutic exercises, 97530- Therapeutic activity, [id number]- Neuromuscular re-education, 97535- Self Care, [id number]- Manual therapy, [id number]- Gait  training, [phone]- Electrical stimulation (manual), [id number]- Ultrasound, 02966- Ionotophoresis 4mg /ml Dexamethasone , Patient/Family education, Balance training, Stair training, and Taping  PLAN FOR NEXT SESSION: *** check & update HEP including functional strength and balance.     Grayce Spatz, PT, DPT 05/01/2024, 7:54 AM  Referring diagnosis? M25.561 (ICD-10-CM) - Acute pain of right knee  Treatment diagnosis? (if different than referring diagnosis) M25.561, M62.81, Z74.09 What was this (referring dx) caused by? []  Surgery []  Fall []  Ongoing issue [x]  Arthritis []  Other: ____________  Laterality: [x]  Rt []  Lt []  Both  Check all possible CPT codes:  *CHOOSE 10 OR LESS*    See Planned Interventions listed in the Plan section of the Evaluation.     "

## 2024-05-04 LAB — COMPREHENSIVE METABOLIC PANEL WITH GFR
AG Ratio: 2 (calc) (ref 1.0–2.5)
ALT: 10 U/L (ref 6–29)
AST: 16 U/L (ref 10–35)
Albumin: 4.3 g/dL (ref 3.6–5.1)
Alkaline phosphatase (APISO): 52 U/L (ref 37–153)
BUN/Creatinine Ratio: 18 (calc) (ref 6–22)
BUN: 18 mg/dL (ref 7–25)
CO2: 31 mmol/L (ref 20–32)
Calcium: 8.9 mg/dL (ref 8.6–10.4)
Chloride: 100 mmol/L (ref 98–110)
Creat: 1.02 mg/dL — ABNORMAL HIGH (ref 0.60–1.00)
Globulin: 2.2 g/dL (ref 1.9–3.7)
Glucose, Bld: 97 mg/dL (ref 65–99)
Potassium: 4.1 mmol/L (ref 3.5–5.3)
Sodium: 138 mmol/L (ref 135–146)
Total Bilirubin: 0.8 mg/dL (ref 0.2–1.2)
Total Protein: 6.5 g/dL (ref 6.1–8.1)
eGFR: 58 mL/min/1.73m2 — ABNORMAL LOW

## 2024-05-04 LAB — CBC WITH DIFFERENTIAL/PLATELET
Absolute Lymphocytes: 2160 {cells}/uL (ref 850–3900)
Absolute Monocytes: 392 {cells}/uL (ref 200–950)
Basophils Absolute: 48 {cells}/uL (ref 0–200)
Basophils Relative: 1.1 %
Eosinophils Absolute: 110 {cells}/uL (ref 15–500)
Eosinophils Relative: 2.5 %
HCT: 33.9 % — ABNORMAL LOW (ref 35.9–46.0)
Hemoglobin: 11 g/dL — ABNORMAL LOW (ref 11.7–15.5)
MCH: 30.3 pg (ref 27.0–33.0)
MCHC: 32.4 g/dL (ref 31.6–35.4)
MCV: 93.4 fL (ref 81.4–101.7)
MPV: 10 fL (ref 7.5–12.5)
Monocytes Relative: 8.9 %
Neutro Abs: 1690 {cells}/uL (ref 1500–7800)
Neutrophils Relative %: 38.4 %
Platelets: 260 Thousand/uL (ref 140–400)
RBC: 3.63 Million/uL — ABNORMAL LOW (ref 3.80–5.10)
RDW: 12.5 % (ref 11.0–15.0)
Total Lymphocyte: 49.1 %
WBC: 4.4 Thousand/uL (ref 3.8–10.8)

## 2024-05-04 LAB — LIPID PANEL
Cholesterol: 183 mg/dL
HDL: 58 mg/dL
LDL Cholesterol (Calc): 110 mg/dL — ABNORMAL HIGH
Non-HDL Cholesterol (Calc): 125 mg/dL
Total CHOL/HDL Ratio: 3.2 (calc)
Triglycerides: 66 mg/dL

## 2024-05-04 LAB — TSH: TSH: 2.06 m[IU]/L (ref 0.40–4.50)

## 2024-05-04 LAB — HEMOGLOBIN A1C: Hgb A1c MFr Bld: 5.5 %

## 2024-05-06 ENCOUNTER — Encounter: Payer: Self-pay | Admitting: Orthopaedic Surgery

## 2024-05-06 ENCOUNTER — Other Ambulatory Visit: Payer: Self-pay

## 2024-05-06 ENCOUNTER — Ambulatory Visit: Admitting: Orthopaedic Surgery

## 2024-05-06 DIAGNOSIS — M1711 Unilateral primary osteoarthritis, right knee: Secondary | ICD-10-CM

## 2024-05-06 DIAGNOSIS — G8929 Other chronic pain: Secondary | ICD-10-CM

## 2024-05-06 DIAGNOSIS — M25561 Pain in right knee: Secondary | ICD-10-CM | POA: Diagnosis not present

## 2024-05-06 MED ORDER — LIDOCAINE HCL 1 % IJ SOLN
3.0000 mL | INTRAMUSCULAR | Status: AC | PRN
  Administered 2024-05-06: 3 mL

## 2024-05-06 MED ORDER — METHYLPREDNISOLONE ACETATE 40 MG/ML IJ SUSP
40.0000 mg | INTRAMUSCULAR | Status: AC | PRN
  Administered 2024-05-06: 40 mg via INTRA_ARTICULAR

## 2024-05-06 NOTE — Progress Notes (Signed)
 The patient is a 74 year old female well-known to us .  She comes in today to go over MRI of her right knee.  She has does have a history of a right knee arthroscopy with a partial medial meniscectomy and been having worsening knee pain.  In September which was just about 4-1/2 months ago we did place a steroid injection in her right knee that did not help her as much.  She points to the medial aspect of the knee as a source of her pain.  There has been no locking and catching.  On exam she does have still medial joint line tenderness of the right knee with slight varus malalignment but good range of motion and no instability on exam.  There is no significant effusion.  The MRI of her right knee does show worsening medial compartment arthritis that the radiologist read is mild but there is some reactive edema in the medial femoral condyle at the weightbearing aspect.  There is no meniscal tear.  I did recommend trying one more steroid injection today but we definitely need to look at the possibility of hyaluronic acid to treat the pain from osteoarthritis and she agrees with this as well.  She did tolerate steroid injection in her right knee today but we will order hyaluronic acid to see if this would treat the long-term pain from osteoarthritis.  She would like to try this as well prior to considering any knee replacement.  This patient is diagnosed with osteoarthritis of the knee(s).    Radiographs show evidence of joint space narrowing, osteophytes, subchondral sclerosis and/or subchondral cysts.  This patient has knee pain which interferes with functional and activities of daily living.    This patient has experienced inadequate response, adverse effects and/or intolerance with conservative treatments such as acetaminophen , NSAIDS, topical creams, physical therapy or regular exercise, knee bracing and/or weight loss.   This patient has experienced inadequate response or has a contraindication to  intra articular steroid injections for at least 3 months.   This patient is not scheduled to have a total knee replacement within 6 months of starting treatment with viscosupplementation.     Procedure Note  Patient: Carla Oconnor             Date of Birth: 08-Aug-1950           MRN: [id number]             Visit Date: 05/06/2024  Procedures: Visit Diagnoses:  1. Unilateral primary osteoarthritis, right knee   2. Chronic pain of right knee     Large Joint Inj: R knee on 05/06/2024 9:20 AM Indications: diagnostic evaluation and pain Details: 22 G 1.5 in needle, superolateral approach  Arthrogram: No  Medications: 3 mL lidocaine  1 %; 40 mg methylPREDNISolone  acetate 40 MG/ML Outcome: tolerated well, no immediate complications Procedure, treatment alternatives, risks and benefits explained, specific risks discussed. Consent was given by the patient. Immediately prior to procedure a time out was called to verify the correct patient, procedure, equipment, support staff and site/side marked as required. Patient was prepped and draped in the usual sterile fashion.

## 2024-05-06 NOTE — Addendum Note (Signed)
 Addended by: PERRI RONAL PARAS on: 05/06/2024 04:29 PM   Modules accepted: Level of Service

## 2024-05-09 ENCOUNTER — Other Ambulatory Visit: Payer: Self-pay | Admitting: Internal Medicine

## 2024-05-09 DIAGNOSIS — Z1231 Encounter for screening mammogram for malignant neoplasm of breast: Secondary | ICD-10-CM

## 2024-05-14 NOTE — Therapy (Incomplete)
 " OUTPATIENT PHYSICAL THERAPY LOWER EXTREMITY TREATMENT   Patient Name: Carla Oconnor MRN: [id number] DOB:12-02-1950, 74 y.o., female Today's Date: 05/14/2024  END OF SESSION:    Past Medical History:  Diagnosis Date   Asthma    as child and seasonal   Diabetes mellitus without complication (HCC)    Fibrocystic breast    GERD (gastroesophageal reflux disease)    Hyperlipidemia    Hypertension    MRSA infection 2008   right arm   Neuromuscular disorder (HCC)    tennis elbow on right and left arm   S/P hysterectomy    Past Surgical History:  Procedure Laterality Date   ABDOMINAL HYSTERECTOMY     2003- still has left ovary   BREAST EXCISIONAL BIOPSY     BREAST SURGERY     biopsy rt breast-1960- benign.Left breast 1975  - lumpectomy - benign   CESAREAN SECTION     COLONOSCOPY     KNEE ARTHROSCOPY Right 12/06/2018   Procedure: RIGHT KNEE ARTHROSCOPY WITH SYNOVECTOMY/DEBRIDEMENT;  Surgeon: Vernetta Lonni GRADE, MD;  Location: [facility];  Service: Orthopedics;  Laterality: Right;   TRIGGER FINGER RELEASE  02/16/2012   Procedure: RELEASE TRIGGER FINGER/A-1 PULLEY;  Surgeon: Lamar LULLA Leonor Mickey., MD;  Location: [facility];  Service: Orthopedics;  Laterality: Right;  RIGHT RING FINGER    Patient Active Problem List   Diagnosis Date Noted   Unilateral primary osteoarthritis, right knee 03/06/2024   S/P right knee arthroscopy 12/12/2018   Acute medial meniscal tear, right, initial encounter 10/08/2018   Herpes simplex type II infection 05/06/2017   Chronic back pain 10/25/2013   Metabolic syndrome 08/17/2013   Anxiety state 05/28/2012   Insomnia 05/28/2012   Hyperlipidemia 01/17/2011   Hypertension 01/17/2011   GE reflux 01/17/2011   Asthma 01/17/2011   Fibrocystic breast disease 01/17/2011   Diabetes mellitus (HCC) 01/17/2011    PCP: Perri Ronal PARAS, MD  REFERRING PROVIDER: Persons, Ronal Dragon, [place]  REFERRING DIAG: [phone] (ICD-10-CM)  - Acute pain of right knee   THERAPY DIAG:  No diagnosis found.  Rationale for Evaluation and Treatment: Rehabilitation  ONSET DATE: 03/06/2024  PA referral to PT  SUBJECTIVE:   SUBJECTIVE STATEMENT: 05/14/24 ***  Evaluation:  This 74yo female with history of OA & right knee arthroscopic surgery (12/06/2018) presented to PA orthopedic office visit with 1 week history of right knee pain 01/11/2024 with no injuries and injection that helped for ~6 weeks. Return visit 03/16/2024 with pain still limiting mobility and PA referred to PT. She walks ~1 mile every other day and does some stretches in morning before gets up.  She has Silver Sneakers but does not use it.  Her shoes were purchased in Spring.   PERTINENT HISTORY: OA, DM2, HTN, HLD  DIAGNOSTIC FINDINGS: X-ray 03/06/2024 Three-view radiographs of her right knee show degenerative changes no acute fractures no differences from previous x-rays significant patellofemoral arthrosis   PAIN:  NPRS scale: in last week  lowest ***  4/10 & highest 9/10 Pain location: right knee medial knee Pain description: sharp Aggravating factors:  stands up and tries to moves esp in morning.  Walking and balance loss.   Relieving factors: get off leg and elevate foot, ice, tylenol  or ibuprofen .    PRECAUTIONS: None  WEIGHT BEARING RESTRICTIONS: No  FALLS:  Has patient fallen in last 6 months? No  LIVING ENVIRONMENT: Lives with: lives with their spouse Lives in: [address] with full bath upstairs.  Lift for stairs.  Stairs:   12-14 steps to 2nd floor on left.  Single threshold to enter.   Has following equipment at home: None  OCCUPATION: retired engineer, site  PLOF: Independent  PATIENT GOALS:   to function without knee pain.   Next MD visit:  04/08/2024 Dr. Vernetta  OBJECTIVE:   PATIENT SURVEYS:  Patient-Specific Activity Scoring Scheme  0 represents unable to perform. 10 represents able to perform at prior level. 0 1 2 3  4 5 6 7 8 9 10  (Date and Score)  Activity Eval     1. Walking without pain  5    2. Stairs   3    3. Standing including getting up without pain 4   4.    5.    Score 4    Total score = sum of the activity scores/number of activities Minimum detectable change (90%CI) for average score = 2 points Minimum detectable change (90%CI) for single activity score = 3 points  COGNITION: Overall cognitive status: WFL    SENSATION: WFL  EDEMA:  Slight edema medial knee area  POSTURE:  weight shift left  PALPATION: Tenderness right knee medial joint line, patella tendon, hamstring Semimembranous & Semitendinous and popliteal area.    LOWER EXTREMITY ROM:   ROM Right eval  Hip flexion   Hip extension   Hip abduction   Hip adduction   Hip internal rotation   Hip external rotation   Knee flexion Supine A: 114*  Knee extension Supine A: 0*  Ankle dorsiflexion   Ankle plantarflexion   Ankle inversion   Ankle eversion    (Blank rows = not tested)  LOWER EXTREMITY MMT:  MMT Right eval Left eval  Hip flexion 5/5 5/5  Hip extension 4/5   Hip abduction 4+/5   Hip adduction 4-/5   Hip internal rotation 4/5   Hip external rotation 4/5   Knee flexion 4/5   Knee extension 4+/5 5/5  Ankle dorsiflexion 5/5 5/5  Ankle plantarflexion    Ankle inversion    Ankle eversion     (Blank rows = not tested)  FUNCTIONAL TESTS:  Sitting in 18 chair:  due to her height & tibia length, she sits with back of thighs compressed by edge of chair.  She reports  18 inch chair transfer: patient able to arise without UE assist.   5X sit to stand 23.35 sec Lt SLS: 11.94 sec Rt SLS: 11.14 sec  GAIT: Distance walked: >100'  Assistive device utilized: None Level of assistance: no assistance needed for safety but would benefit from PT education on mechanics.   Comments: RLE externally rotated > LLE during entire gait cycle.  RLE abduction.  TODAY'S TREATMENT                                                                          DATE:  05/15/2024 TherEx *** TherAct  TREATMENT                                                                          DATE:  05/01/2024 Therapeutic Exercise: ***  HEP instruction/performance c cues for techniques, handout provided.  Trial set performed of each for comprehension and symptom assessment.  See below for exercise list   TREATMENT                                                                          DATE:  04/03/2024 Therapeutic Exercise: HEP instruction/performance c cues for techniques, handout provided.  Trial set performed of each for comprehension and symptom assessment.  See below for exercise list  PATIENT EDUCATION:  Education details: HEP, POC Person educated: Patient Education method: Explanation, Demonstration, Verbal cues, and Handouts Education comprehension: verbalized understanding, returned demonstration, and verbal cues required  HOME EXERCISE PROGRAM: *** Access Code: [email or web address] URL: [email or web address] Date: 04/03/2024 Prepared by: Grayce Spatz  Exercises - sit to / from stand using legs   - 1 x daily - 7 x weekly - 2 sets - 10 reps - 5 seconds hold - Seated Active Straight-Leg Raise  - 1 x daily - 7 x weekly - 2 sets - 10 reps - 5 seconds hold - Tandem Stance  - 1 x daily - 7 x weekly - 3 sets - 2 reps - 30 seconds hold  ASSESSMENT:  CLINICAL IMPRESSION: ***  Patient is a 74 y.o. who comes to clinic with complaints of right knee pain with mobility, strength and movement coordination deficits that impair their ability to perform usual daily and recreational functional activities without increase difficulty/symptoms at this time.  Patient to benefit from skilled PT services to address impairments and limitations to improve to  previous level of function without restriction secondary to condition.   OBJECTIVE IMPAIRMENTS: Abnormal gait, decreased activity tolerance, decreased balance, decreased knowledge of condition, decreased strength, and pain.   ACTIVITY LIMITATIONS: carrying, lifting, sitting, standing, stairs, transfers, and locomotion level  PARTICIPATION LIMITATIONS: shopping and community activity  PERSONAL FACTORS: Past/current experiences, Time since onset of injury/illness/exacerbation, and 3+ comorbidities: see PMH are also affecting patient's functional outcome.   REHAB POTENTIAL: Good  CLINICAL DECISION MAKING: Stable/uncomplicated  EVALUATION COMPLEXITY: Low   GOALS: *** Goals reviewed with patient? Yes  SHORT TERM GOALS: (target date for Short term goals 04/17/2024)   1.  Patient will demonstrate independent use of home exercise program to maintain progress from in  clinic treatments. Goal status: Ongoing  05/01/2024  LONG TERM GOALS: (target dates for all long term goals  05/15/2024 )   1. Patient will demonstrate/report pain at worst less than or equal to 2/10 to facilitate minimal limitation in daily activity secondary to pain symptoms. Goal status:   Ongoing  05/01/2024   2. Patient will demonstrate independent use of home exercise program to facilitate ability to maintain/progress functional gains from skilled physical therapy services. Goal status:   Ongoing  05/01/2024   3. Patient will demonstrate Patient specific functional scale avg > or = 7 to indicate reduced disability due to condition.  Goal status:  Ongoing  05/01/2024   4.  Patient will demonstrate Right LE MMT 5/5 throughout to faciltiate usual transfers, stairs, squatting at [facility] for daily life.  Goal status:   Ongoing  05/01/2024    PLAN:  PT FREQUENCY: 1x/week  PT DURATION: 5 visits after eval over 6 weeks  PLANNED INTERVENTIONS: 97164- PT Re-evaluation, 97750- Physical Performance Testing, 97110-Therapeutic  exercises, 97530- Therapeutic activity, [id number]- Neuromuscular re-education, 97535- Self Care, 02859- Manual therapy, [id number]- Gait training, [phone]- Electrical stimulation (manual), [id number]- Ultrasound, 02966- Ionotophoresis 4mg /ml Dexamethasone , Patient/Family education, Balance training, Stair training, and Taping  PLAN FOR NEXT SESSION: *** check & update HEP including functional strength and balance.    Sign***  Referring diagnosis? M25.561 (ICD-10-CM) - Acute pain of right knee  Treatment diagnosis? (if different than referring diagnosis) M25.561, M62.81, Z74.09 What was this (referring dx) caused by? []  Surgery []  Fall []  Ongoing issue [x]  Arthritis []  Other: ____________  Laterality: [x]  Rt []  Lt []  Both  Check all possible CPT codes:  *CHOOSE 10 OR LESS*    See Planned Interventions listed in the Plan section of the Evaluation.     "

## 2024-05-15 ENCOUNTER — Encounter: Admitting: Physical Therapy

## 2024-05-16 ENCOUNTER — Other Ambulatory Visit

## 2024-05-16 ENCOUNTER — Ambulatory Visit: Admitting: "Endocrinology

## 2024-05-16 ENCOUNTER — Encounter: Payer: Self-pay | Admitting: "Endocrinology

## 2024-05-16 ENCOUNTER — Telehealth: Payer: Self-pay | Admitting: Dietician

## 2024-05-16 VITALS — BP 120/80 | HR 83 | Ht 61.0 in | Wt 159.0 lb

## 2024-05-16 DIAGNOSIS — Z7985 Long-term (current) use of injectable non-insulin antidiabetic drugs: Secondary | ICD-10-CM

## 2024-05-16 DIAGNOSIS — E119 Type 2 diabetes mellitus without complications: Secondary | ICD-10-CM

## 2024-05-16 DIAGNOSIS — E7800 Pure hypercholesterolemia, unspecified: Secondary | ICD-10-CM | POA: Diagnosis not present

## 2024-05-16 LAB — MICROALBUMIN / CREATININE URINE RATIO
Creatinine, Urine: 277 mg/dL — ABNORMAL HIGH (ref 20–275)
Microalb Creat Ratio: 6 mg/g{creat}
Microalb, Ur: 1.7 mg/dL

## 2024-05-16 MED ORDER — TIRZEPATIDE 5 MG/0.5ML ~~LOC~~ SOAJ: 5.0000 mg | SUBCUTANEOUS | 1 refills | Status: AC

## 2024-05-16 NOTE — Patient Instructions (Signed)

## 2024-05-16 NOTE — Telephone Encounter (Signed)
 Patient saw Dr. Dartha today and stated that the Mounjaro  has increased from $45 per month to $80 per month through [facility].  Provided her the phone number for [facility] as well as their question line.  She states that her insurance likes prescription run through [facility] but did not wish for me to request this at this time.  Leita Constable, RD, LDN, CDCES, DipACLM

## 2024-05-16 NOTE — Progress Notes (Signed)
 "   Outpatient Endocrinology Note Carla Birmingham, MD  05/16/24   Carla Oconnor 06-Jul-1950 [id number]  Referring Provider: Perri Ronal PARAS, MD Primary Care Provider: Perri Ronal PARAS, MD Reason for consultation: Subjective   Assessment & Plan  Carla Oconnor was seen today for diabetes.  Diagnoses and all orders for this visit:  Controlled type 2 diabetes mellitus without complication (HCC) -     Microalbumin / creatinine urine ratio  Long-term (current) use of injectable non-insulin  antidiabetic drugs  Pure hypercholesterolemia  Other orders -     tirzepatide  (MOUNJARO ) 5 MG/0.5ML Pen; Inject 5 mg into the skin once a week.   Diabetes Type II with no complications, No results found for: GFR Hba1c goal less than 7, current Hba1c is  Lab Results  Component Value Date   HGBA1C 5.5 04/29/2024   Will recommend the following: 05/16/24: Mounjaro  5 mg/week -wants to stay on current dose  No known contraindications/side effects to any of above medications No history of MEN syndrome/medullary thyroid  cancer/pancreatitis or pancreatic cancer in self or family  -Last LD and Tg are as follows: Lab Results  Component Value Date   LDLCALC 110 (H) 04/29/2024    Lab Results  Component Value Date   TRIG 66 04/29/2024   -On rosuvastatin  10 mg every day: LDL 110 -05/16/24: patient is not interested in increasing the dose; wants to work on her diet; understands the risks  -Follow low fat diet and exercise   -Blood pressure goal <140/90 - Microalbumin/creatinine goal is < 30 -Last MA/Cr is as follows: Lab Results  Component Value Date   MICROALBUR 0.9 11/02/2023   -on ACE/ARB valsartan   -diet changes including salt restriction -limit eating outside -counseled BP targets per standards of diabetes care -uncontrolled blood pressure can lead to retinopathy, nephropathy and cardiovascular and atherosclerotic heart disease  Reviewed and counseled on: -A1C target -Blood sugar  targets -Complications of uncontrolled diabetes  -Checking blood sugar before meals and bedtime and bring log next visit -All medications with mechanism of action and side effects -Hypoglycemia management: rule of 15's, Glucagon Emergency Kit and medical alert ID -low-carb low-fat plate-method diet -At least 20 minutes of physical activity per day -Annual dilated retinal eye exam and foot exam -compliance and follow up needs -follow up as scheduled or earlier if problem gets worse  Call if blood sugar is less than 70 or consistently above 250    Take a 15 gm snack of carbohydrate at bedtime before you go to sleep if your blood sugar is less than 100.    If you are going to fast after midnight for a test or procedure, ask your physician for instructions on how to reduce/decrease your insulin  dose.    Call if blood sugar is less than 70 or consistently above 250  -Treating a low sugar by rule of 15  (15 gms of sugar every 15 min until sugar is more than 70) If you feel your sugar is low, test your sugar to be sure If your sugar is low (less than 70), then take 15 grams of a fast acting Carbohydrate (3-4 glucose tablets or glucose gel or 4 ounces of juice or regular soda) Recheck your sugar 15 min after treating low to make sure it is more than 70 If sugar is still less than 70, treat again with 15 grams of carbohydrate          Don't drive the hour of hypoglycemia  If unconscious/unable to eat or drink  by mouth, use glucagon injection or nasal spray baqsimi and call 911. Can repeat again in 15 min if still unconscious.  Return in about 6 months (around 11/13/2024) for visit, labs today.   I have reviewed current medications, nurse's notes, allergies, vital signs, past medical and surgical history, family medical history, and social history for this encounter. Counseled patient on symptoms, examination findings, lab findings, imaging results, treatment decisions and monitoring and  prognosis. The patient understood the recommendations and agrees with the treatment plan. All questions regarding treatment plan were fully answered.  Carla Birmingham, MD  05/16/24  History of Present Illness Carla Oconnor is a 74 y.o. year old female who presents for follow up of Type II diabetes mellitus.  Carla Oconnor was first diagnosed around 2015 Diabetes education +  Home diabetes regimen: Mounjaro  5 mg/week  COMPLICATIONS -  MI/Stroke -  retinopathy -  neuropathy -  nephropathy  BLOOD SUGAR DATA Checks qam every other day Range: 84-101  Physical Exam  BP 120/80   Pulse 83   Ht 5' 1 (1.549 m)   Wt 159 lb (72.1 kg)   SpO2 95%   BMI 30.04 kg/m    Constitutional: well developed, well nourished Head: normocephalic, atraumatic Eyes: sclera anicteric, no redness Neck: supple Lungs: normal respiratory effort Neurology: alert and oriented Skin: dry, no appreciable rashes Musculoskeletal: no appreciable defects Psychiatric: normal mood and affect Diabetic Foot Exam - Simple   No data filed      Current Medications Patient's Medications  New Prescriptions   TIRZEPATIDE  (MOUNJARO ) 5 MG/0.5ML PEN    Inject 5 mg into the skin once a week.  Previous Medications   ALBUTEROL  (VENTOLIN  HFA) 108 (90 BASE) MCG/ACT INHALER    Inhale 2 puffs into the lungs every 6 (six) hours as needed for wheezing or shortness of breath.   ASPIRIN  81 MG TABLET    Take 81 mg by mouth 2 (two) times daily.    B COMPLEX VITAMINS CAPSULE    Take 1 capsule by mouth daily.   BAYER CONTOUR TEST TEST STRIP    USE AS DIRECTED   CHOLECALCIFEROL (VITAMIN D ) 1000 UNITS TABLET    Take 1,000 Units by mouth daily.    CLONAZEPAM  (KLONOPIN ) 0.5 MG TABLET    TAKE 1 TABLET(0.5 MG) BY MOUTH AT BEDTIME AS NEEDED FOR SLEEP   CYANOCOBALAMIN (B-12 PO)    Take by mouth.   ETODOLAC  (LODINE ) 500 MG TABLET    Take 1 tablet (500 mg total) by mouth 2 (two) times daily.   FINGERSTIX LANCETS MISC    Check  glucose 4-5 TIMES a day. DISPENSE Microlet Colored Lancets   GENERIC EXTERNAL MEDICATION    Vitamin E   GLUCOSE BLOOD (CONTOUR NEXT TEST) TEST STRIP    Check glucose levels 4-5 times daily   GLUCOSE BLOOD TEST STRIP    Check glucose 4-5 a day. DISPENSE BAYER TEST STRIPS AND LANCETS   IBUPROFEN  (ADVIL ) 800 MG TABLET    Take 1 tablet (800 mg total) by mouth every 8 (eight) hours as needed.   MULTIPLE VITAMIN (MULTIVITAMIN WITH MINERALS) TABS TABLET    Take 1 tablet by mouth daily.   OMEPRAZOLE  (PRILOSEC) 20 MG CAPSULE    TAKE 1 CAPSULE(20 MG) BY MOUTH DAILY   POTASSIUM CHLORIDE  SA (KLOR-CON  M) 20 MEQ TABLET    Take 1 tablet (20 mEq total) by mouth daily.   ROSUVASTATIN  (CRESTOR ) 10 MG TABLET    TAKE 1 TABLET(10 MG)  BY MOUTH DAILY   VALACYCLOVIR  (VALTREX ) 500 MG TABLET    TAKE 1 TABLET(500 MG) BY MOUTH TWICE DAILY   VALSARTAN -HYDROCHLOROTHIAZIDE  (DIOVAN -HCT) 160-25 MG TABLET    TAKE 1 TABLET BY MOUTH EVERY DAY  Modified Medications   No medications on file  Discontinued Medications   No medications on file    Allergies Allergies  Allergen Reactions   Tramadol  Hives and Itching   Percocet [Oxycodone -Acetaminophen ] Itching    Past Medical History Past Medical History:  Diagnosis Date   Asthma    as child and seasonal   Diabetes mellitus without complication (HCC)    Fibrocystic breast    GERD (gastroesophageal reflux disease)    Hyperlipidemia    Hypertension    MRSA infection 2008   right arm   Neuromuscular disorder (HCC)    tennis elbow on right and left arm   S/P hysterectomy     Past Surgical History Past Surgical History:  Procedure Laterality Date   ABDOMINAL HYSTERECTOMY     2003- still has left ovary   BREAST EXCISIONAL BIOPSY     BREAST SURGERY     biopsy rt breast-1960- benign.Left breast 1975  - lumpectomy - benign   CESAREAN SECTION     COLONOSCOPY     KNEE ARTHROSCOPY Right 12/06/2018   Procedure: RIGHT KNEE ARTHROSCOPY WITH SYNOVECTOMY/DEBRIDEMENT;   Surgeon: Vernetta Lonni GRADE, MD;  Location: [facility];  Service: Orthopedics;  Laterality: Right;   TRIGGER FINGER RELEASE  02/16/2012   Procedure: RELEASE TRIGGER FINGER/A-1 PULLEY;  Surgeon: Lamar LULLA Leonor Mickey., MD;  Location: [facility];  Service: Orthopedics;  Laterality: Right;  RIGHT RING FINGER     Family History family history includes Asthma in an other family member; Breast cancer in her sister; Cancer in her sister and another family member; Diabetes in an other family member; Hyperlipidemia in an other family member; Hypertension in an other family member; Stroke in her father.  Social History Social History   Socioeconomic History   Marital status: Married    Spouse name: Not on file   Number of children: Not on file   Years of education: Not on file   Highest education level: Not on file  Occupational History   Not on file  Tobacco Use   Smoking status: Never   Smokeless tobacco: Never  Vaping Use   Vaping status: Never Used  Substance and Sexual Activity   Alcohol use: Yes    Comment: rarely   Drug use: No   Sexual activity: Not Currently  Other Topics Concern   Not on file  Social History Narrative   Social history: She is married.  She has a event organiser.  She is self-employed in case management.  She has one set of twins, boy and girl and also another son.  She lost her nephew during the summer 2019 and a single car accident [address].  Bad weather.  Her husband is a english as a second language teacher and a [place] veteran.       In the spring 2014 she saw cardiologist regarding chest pain.  She had a negative Cardiolite  study.  She had previous Cardiolite  study in 2009 that was negative.  She had a cardiac cath with Dr. Bulah in 2003.       Family history: Father died at age 20 with stroke.  Mother with history of diabetes and hypertension.  1 brother in fairly good health but has had trouble with crack addiction.  4  sisters.  She has 2 full sisters and 2 half sisters.  1 sister died of kidney failure with history of breast cancer.  1 sister with history of diabetes and coronary disease.   Social Drivers of Health   Tobacco Use: Low Risk (05/16/2024)   Patient History    Smoking Tobacco Use: Never    Smokeless Tobacco Use: Never    Passive Exposure: Not on file  Financial Resource Strain: Low Risk (04/30/2024)   Overall Financial Resource Strain (CARDIA)    Difficulty of Paying Living Expenses: Not hard at all  Food Insecurity: No Food Insecurity (04/30/2024)   Epic    Worried About Programme Researcher, Broadcasting/film/video in the Last Year: Never true    Ran Out of Food in the Last Year: Never true  Transportation Needs: No Transportation Needs (04/30/2024)   Epic    Lack of Transportation (Medical): No    Lack of Transportation (Non-Medical): No  Physical Activity: Insufficiently Active (04/30/2024)   Exercise Vital Sign    Days of Exercise per Week: 2 days    Minutes of Exercise per Session: 10 min  Stress: No Stress Concern Present (04/30/2024)   [employer] of Occupational Health - Occupational Stress Questionnaire    Feeling of Stress: Only a little  Social Connections: Socially Integrated (04/30/2024)   Social Connection and Isolation Panel    Frequency of Communication with Friends and Family: More than three times a week    Frequency of Social Gatherings with Friends and Family: More than three times a week    Attends Religious Services: More than 4 times per year    Active Member of Clubs or Organizations: Yes    Attends Banker Meetings: More than 4 times per year    Marital Status: Married  Catering Manager Violence: Not At Risk (04/30/2024)   Epic    Fear of Current or Ex-Partner: No    Emotionally Abused: No    Physically Abused: No    Sexually Abused: No  Depression (PHQ2-9): Low Risk (04/30/2024)   Depression (PHQ2-9)    PHQ-2 Score: 3  Alcohol Screen: Low Risk (04/30/2024)    Alcohol Screen    Last Alcohol Screening Score (AUDIT): 0  Housing: Low Risk (04/30/2024)   Epic    Unable to Pay for Housing in the Last Year: No    Number of Times Moved in the Last Year: 0    Homeless in the Last Year: No  Utilities: Not At Risk (04/30/2024)   Epic    Threatened with loss of utilities: No  Health Literacy: Adequate Health Literacy (04/30/2024)   B1300 Health Literacy    Frequency of need for help with medical instructions: Never    Lab Results  Component Value Date   HGBA1C 5.5 04/29/2024   HGBA1C 6.1 (H) 02/01/2024   HGBA1C 6.3 (H) 12/07/2023   Lab Results  Component Value Date   CHOL 183 04/29/2024   Lab Results  Component Value Date   HDL 58 04/29/2024   Lab Results  Component Value Date   LDLCALC 110 (H) 04/29/2024   Lab Results  Component Value Date   TRIG 66 04/29/2024   Lab Results  Component Value Date   CHOLHDL 3.2 04/29/2024   Lab Results  Component Value Date   CREATININE 1.02 (H) 04/29/2024   No results found for: GFR Lab Results  Component Value Date   MICROALBUR 0.9 11/02/2023      Component Value Date/Time  NA 138 04/29/2024 0945   K 4.1 04/29/2024 0945   CL 100 04/29/2024 0945   CO2 31 04/29/2024 0945   GLUCOSE 97 04/29/2024 0945   BUN 18 04/29/2024 0945   CREATININE 1.02 (H) 04/29/2024 0945   CALCIUM  8.9 04/29/2024 0945   PROT 6.5 04/29/2024 0945   ALBUMIN 4.3 09/22/2016 1012   AST 16 04/29/2024 0945   ALT 10 04/29/2024 0945   ALKPHOS 62 09/22/2016 1012   BILITOT 0.8 04/29/2024 0945   GFRNONAA 52 (L) 10/06/2020 0929   GFRAA 60 10/06/2020 0929      Latest Ref Rng & Units 04/29/2024    9:45 AM 12/07/2023    9:45 AM 11/02/2023   10:06 AM  BMP  Glucose 65 - 99 mg/dL 97  68  84   BUN 7 - 25 mg/dL 18  21  24    Creatinine 0.60 - 1.00 mg/dL 8.97  8.94  8.86   BUN/Creat Ratio 6 - 22 (calc) 18  20  21    Sodium 135 - 146 mmol/L 138  140  141   Potassium 3.5 - 5.3 mmol/L 4.1  4.2  4.3   Chloride 98 - 110 mmol/L 100   101  102   CO2 20 - 32 mmol/L 31  31  30    Calcium  8.6 - 10.4 mg/dL 8.9  9.9  9.3        Component Value Date/Time   WBC 4.4 04/29/2024 0945   RBC 3.63 (L) 04/29/2024 0945   HGB 11.0 (L) 04/29/2024 0945   HCT 33.9 (L) 04/29/2024 0945   PLT 260 04/29/2024 0945   MCV 93.4 04/29/2024 0945   MCH 30.3 04/29/2024 0945   MCHC 32.4 04/29/2024 0945   RDW 12.5 04/29/2024 0945   LYMPHSABS 2,080 04/21/2022 0941   MONOABS 0.5 08/16/2019 1944   EOSABS 110 04/29/2024 0945   BASOSABS 48 04/29/2024 0945     Parts of this note may have been dictated using voice recognition software. There may be variances in spelling and vocabulary which are unintentional. Not all errors are proofread. Please notify the dino if any discrepancies are noted or if the meaning of any statement is not clear.   "

## 2024-05-17 ENCOUNTER — Other Ambulatory Visit (HOSPITAL_COMMUNITY): Payer: Self-pay

## 2024-05-17 ENCOUNTER — Telehealth: Payer: Self-pay

## 2024-05-17 NOTE — Telephone Encounter (Signed)
 Pharmacy Patient Advocate Encounter   Received notification from [facility] that prior authorization for Mounjaro  5mg /0.87ml is required/requested.   Insurance verification completed.   The patient is insured through [facility].   Per test claim: The current 30 day co-pay is, $75.  No PA needed at this time. This test claim was processed through [facility]- copay amounts may vary at other pharmacies due to pharmacy/plan contracts, or as the patient moves through the different stages of their insurance plan.     There is already a PA on file. Expiration 12/28/2026

## 2024-06-24 ENCOUNTER — Ambulatory Visit

## 2024-10-28 ENCOUNTER — Other Ambulatory Visit

## 2024-11-04 ENCOUNTER — Ambulatory Visit: Admitting: Internal Medicine

## 2024-11-04 ENCOUNTER — Ambulatory Visit: Admitting: "Endocrinology
